# Patient Record
Sex: Female | Born: 1990 | Race: Black or African American | Hispanic: No | Marital: Single | Smoking: Former smoker
Health system: Southern US, Community
[De-identification: ages and names within clinical notes are randomized; demographics above are authoritative.]

## PROBLEM LIST (undated history)

## (undated) DIAGNOSIS — F1490 Cocaine use, unspecified, uncomplicated: Secondary | ICD-10-CM

## (undated) DIAGNOSIS — B009 Herpesviral infection, unspecified: Secondary | ICD-10-CM

## (undated) DIAGNOSIS — O139 Gestational [pregnancy-induced] hypertension without significant proteinuria, unspecified trimester: Secondary | ICD-10-CM

## (undated) HISTORY — DX: Gestational (pregnancy-induced) hypertension without significant proteinuria, unspecified trimester: O13.9

---

## 2016-02-25 DIAGNOSIS — F1020 Alcohol dependence, uncomplicated: Secondary | ICD-10-CM | POA: Insufficient documentation

## 2016-02-25 DIAGNOSIS — F332 Major depressive disorder, recurrent severe without psychotic features: Secondary | ICD-10-CM | POA: Insufficient documentation

## 2016-05-02 DIAGNOSIS — F17200 Nicotine dependence, unspecified, uncomplicated: Secondary | ICD-10-CM | POA: Insufficient documentation

## 2016-06-20 DIAGNOSIS — Z8659 Personal history of other mental and behavioral disorders: Secondary | ICD-10-CM | POA: Insufficient documentation

## 2016-07-31 DIAGNOSIS — F1910 Other psychoactive substance abuse, uncomplicated: Secondary | ICD-10-CM | POA: Insufficient documentation

## 2016-07-31 DIAGNOSIS — F3130 Bipolar disorder, current episode depressed, mild or moderate severity, unspecified: Secondary | ICD-10-CM | POA: Diagnosis present

## 2016-08-01 DIAGNOSIS — F314 Bipolar disorder, current episode depressed, severe, without psychotic features: Secondary | ICD-10-CM | POA: Insufficient documentation

## 2017-03-28 ENCOUNTER — Emergency Department (HOSPITAL_COMMUNITY)
Admission: EM | Admit: 2017-03-28 | Discharge: 2017-03-29 | Disposition: A | Discharge status: Home / Self Care | Payer: Self-pay | Attending: Emergency Medicine | Admitting: Emergency Medicine

## 2017-03-28 DIAGNOSIS — N390 Urinary tract infection, site not specified: Secondary | ICD-10-CM

## 2017-03-28 DIAGNOSIS — F1410 Cocaine abuse, uncomplicated: Secondary | ICD-10-CM

## 2017-03-28 DIAGNOSIS — R4182 Altered mental status, unspecified: Secondary | ICD-10-CM

## 2017-03-29 ENCOUNTER — Encounter (HOSPITAL_COMMUNITY): Payer: Self-pay

## 2017-03-29 ENCOUNTER — Emergency Department (HOSPITAL_COMMUNITY): Payer: Self-pay

## 2017-03-29 LAB — URINALYSIS, ROUTINE W REFLEX MICROSCOPIC
BILIRUBIN URINE: NEGATIVE
Glucose, UA: NEGATIVE mg/dL
HGB URINE DIPSTICK: NEGATIVE
KETONES UR: NEGATIVE mg/dL
NITRITE: POSITIVE — AB
PROTEIN: 30 mg/dL — AB
Specific Gravity, Urine: 1.025 (ref 1.005–1.030)
pH: 5 (ref 5.0–8.0)

## 2017-03-29 LAB — CBC WITH DIFFERENTIAL/PLATELET
Basophils Absolute: 0.1 10*3/uL (ref 0.0–0.1)
Basophils Relative: 1 %
EOS PCT: 3 %
Eosinophils Absolute: 0.3 10*3/uL (ref 0.0–0.7)
HCT: 38.8 % (ref 36.0–46.0)
Hemoglobin: 12.8 g/dL (ref 12.0–15.0)
LYMPHS ABS: 3.5 10*3/uL (ref 0.7–4.0)
LYMPHS PCT: 38 %
MCH: 27.5 pg (ref 26.0–34.0)
MCHC: 33 g/dL (ref 30.0–36.0)
MCV: 83.4 fL (ref 78.0–100.0)
MONO ABS: 0.7 10*3/uL (ref 0.1–1.0)
MONOS PCT: 8 %
Neutro Abs: 4.8 10*3/uL (ref 1.7–7.7)
Neutrophils Relative %: 50 %
PLATELETS: 375 10*3/uL (ref 150–400)
RBC: 4.65 MIL/uL (ref 3.87–5.11)
RDW: 14.5 % (ref 11.5–15.5)
WBC: 9.4 10*3/uL (ref 4.0–10.5)

## 2017-03-29 LAB — COMPREHENSIVE METABOLIC PANEL
ALT: 16 U/L (ref 14–54)
AST: 25 U/L (ref 15–41)
Albumin: 3.9 g/dL (ref 3.5–5.0)
Alkaline Phosphatase: 75 U/L (ref 38–126)
Anion gap: 6 (ref 5–15)
BUN: 7 mg/dL (ref 6–20)
CHLORIDE: 106 mmol/L (ref 101–111)
CO2: 29 mmol/L (ref 22–32)
CREATININE: 1.01 mg/dL — AB (ref 0.44–1.00)
Calcium: 9.2 mg/dL (ref 8.9–10.3)
Glucose, Bld: 106 mg/dL — ABNORMAL HIGH (ref 65–99)
Potassium: 3.4 mmol/L — ABNORMAL LOW (ref 3.5–5.1)
Sodium: 141 mmol/L (ref 135–145)
Total Bilirubin: 0.2 mg/dL — ABNORMAL LOW (ref 0.3–1.2)
Total Protein: 7.6 g/dL (ref 6.5–8.1)

## 2017-03-29 LAB — RAPID URINE DRUG SCREEN, HOSP PERFORMED
Amphetamines: NOT DETECTED
Barbiturates: NOT DETECTED
Benzodiazepines: NOT DETECTED
COCAINE: POSITIVE — AB
OPIATES: NOT DETECTED
TETRAHYDROCANNABINOL: NOT DETECTED

## 2017-03-29 LAB — PREGNANCY, URINE: Preg Test, Ur: NEGATIVE

## 2017-03-29 LAB — I-STAT CG4 LACTIC ACID, ED: Lactic Acid, Venous: 1.06 mmol/L (ref 0.5–1.9)

## 2017-03-29 LAB — CBG MONITORING, ED: Glucose-Capillary: 129 mg/dL — ABNORMAL HIGH (ref 65–99)

## 2017-03-29 LAB — ETHANOL

## 2017-03-29 MED ORDER — SODIUM CHLORIDE 0.9 % IV SOLN
INTRAVENOUS | Status: DC
  Administered 2017-03-29: 05:00:00 via INTRAVENOUS

## 2017-03-29 MED ORDER — DEXTROSE 5 % IV SOLN
1.0000 g | Freq: Once | INTRAVENOUS | Status: AC
  Administered 2017-03-29: 1 g via INTRAVENOUS
  Filled 2017-03-29: qty 10

## 2017-03-29 MED ORDER — NALOXONE HCL 4 MG/0.1ML NA LIQD
0.4000 mg | Freq: Once | NASAL | Status: AC
  Administered 2017-03-29: 0.4 mg via NASAL

## 2017-03-29 MED ORDER — POTASSIUM CHLORIDE CRYS ER 20 MEQ PO TBCR
40.0000 meq | EXTENDED_RELEASE_TABLET | Freq: Once | ORAL | Status: AC
  Administered 2017-03-29: 40 meq via ORAL
  Filled 2017-03-29: qty 2

## 2017-03-29 MED ORDER — CEPHALEXIN 500 MG PO CAPS: 500.0000 mg | ORAL_CAPSULE | Freq: Four times a day (QID) | ORAL | 0 refills | Status: DC

## 2017-03-29 MED ORDER — SODIUM CHLORIDE 0.9 % IV BOLUS (SEPSIS)
1000.0000 mL | Freq: Once | INTRAVENOUS | Status: AC
  Administered 2017-03-29: 1000 mL via INTRAVENOUS

## 2017-03-29 NOTE — Discharge Instructions (Signed)
1. Medications: keflex, usual home medications °2. Treatment: rest, drink plenty of fluids, take medications as prescribed °3. Follow Up: Please followup with your primary doctor in 3 days for discussion of your diagnoses and further evaluation after today's visit; if you do not have a primary care doctor use the resource guide provided to find one; return to the ER for fevers, persistent vomiting, worsening abdominal pain or other concerning symptoms. ° °

## 2017-03-29 NOTE — ED Notes (Signed)
Awaiting [employer]

## 2017-03-29 NOTE — ED Triage Notes (Signed)
PT brought in by EMS who was called by [employer] for a pt check up. Per [employer] pt got pulled out of a car according to bystanders, and pt went into gas station and began yelling, and pulling off her shirt and acting erratic. Pt would not cooperate with [employer] and was placed in cuff ( spitting, cursing). Pt did stated to [employer] that she ingested Crack.  Pt was able to stand but refused to cooperate and give [employer] name or any other information. Pt is currently in police custody.     HR 80 BP 92/58, RR 30, CBG 154, O2 94% RA

## 2017-03-29 NOTE — ED Provider Notes (Signed)
[facility]-EMERGENCY DEPT Provider Note   CSN: [id number] Arrival date & time: 03/28/17  2354     History   Chief Complaint Chief Complaint  Patient presents with  . Drug Overdose    HPI Jordan Walton is a 26 y.o. female presents via EMS with [employer] after [employer] was called to a gas station. Her [employer], patient was out of the vehicle at the gas station. She went inside and began to disrobe, cursing and screaming. EMS reports that patient was very lethargic upon their arrival with eyes rolling back in her head but no seizure activity. There report that when stimulated she became angry cursing and screaming but was unwilling or unable to provide any acute finding information or health history.  Level V caveat for altered mental status.  The history is provided by the EMS personnel and the police. No language interpreter was used.    No past medical history on file.  There are no active problems to display for this patient.   No past surgical history on file.  OB History    No data available       Home Medications    Prior to Admission medications   Medication Sig Start Date End Date Taking? Authorizing Provider  cephALEXin (KEFLEX) 500 MG capsule Take 1 capsule (500 mg total) by mouth 4 (four) times daily. 03/29/17   Kaylenn Civil, Dahlia Client, PA-C    Family History No family history on file.  Social History Social History  Substance Use Topics  . Smoking status: Not on file  . Smokeless tobacco: Not on file  . Alcohol use Not on file     Allergies   Patient has no allergy information on record.   Review of Systems Review of Systems  Unable to perform ROS: Mental status change     Physical Exam Updated Vital Signs BP (!) 99/58 (BP Location: Left Arm)   Pulse 66   Temp 97.7 F (36.5 C) (Oral)   Resp 18   LMP  (LMP Unknown)   SpO2 96%   Physical Exam  Constitutional: She appears well-developed and well-nourished. She appears lethargic.  When gently  stimulated, patient opens her eyes and makes eye contact, but almost immediately falls asleep. She answers no questions.  When more forcefully stimulated she began spitting, but continues to be unwilling or unable to answer questions  HENT:  Head: Normocephalic.  Eyes: Conjunctivae are normal. Pupils are equal, round, and reactive to light. No scleral icterus.  Pupils midpoint  Neck: Normal range of motion.  Cardiovascular: Normal rate, regular rhythm and intact distal pulses.   Pulses:      Radial pulses are 2+ on the right side, and 2+ on the left side.       Dorsalis pedis pulses are 2+ on the right side, and 2+ on the left side.  Pulmonary/Chest: Effort normal.  Snoring respirations No contusions, lacerations or erythema No crepitus or flail segment  Abdominal: Soft. Bowel sounds are normal.  No contusions, lacerations or erythema  Genitourinary:  Genitourinary Comments: Chaperone present Normal external GU exam  Musculoskeletal: Normal range of motion.  When stimulated, pt moves all extremities No contusions, ecchymosis or step offs of the C, T or L-spine  Neurological: She appears lethargic. GCS eye subscore is 2. GCS verbal subscore is 1. GCS motor subscore is 5.  Skin: Skin is warm and dry.  Nursing note and vitals reviewed.    ED Treatments / Results  Labs (all labs ordered are  listed, but only abnormal results are displayed) Labs Reviewed  COMPREHENSIVE METABOLIC PANEL - Abnormal; Notable for the following:       Result Value   Potassium 3.4 (*)    Glucose, Bld 106 (*)    Creatinine, Ser 1.01 (*)    Total Bilirubin 0.2 (*)    All other components within normal limits  RAPID URINE DRUG SCREEN, HOSP PERFORMED - Abnormal; Notable for the following:    Cocaine POSITIVE (*)    All other components within normal limits  URINALYSIS, ROUTINE W REFLEX MICROSCOPIC - Abnormal; Notable for the following:    Color, Urine AMBER (*)    APPearance HAZY (*)    Protein, ur 30  (*)    Nitrite POSITIVE (*)    Leukocytes, UA MODERATE (*)    Bacteria, UA RARE (*)    Squamous Epithelial / LPF 6-30 (*)    All other components within normal limits  CBG MONITORING, ED - Abnormal; Notable for the following:    Glucose-Capillary 129 (*)    All other components within normal limits  URINE CULTURE  CBC WITH DIFFERENTIAL/PLATELET  ETHANOL  PREGNANCY, URINE  I-STAT CG4 LACTIC ACID, ED  I-STAT CG4 LACTIC ACID, ED    EKG  EKG Interpretation  Date/Time:  Wednesday March 29 2017 01:08:11 EDT Ventricular Rate:  71 PR Interval:    QRS Duration: 98 QT Interval:  421 QTC Calculation: 458 R Axis:   63 Text Interpretation:  Sinus rhythm Confirmed by Ross Marcus ([id number]) on 03/29/2017 1:16:38 AM       Radiology Ct Head Wo Contrast  Result Date: 03/29/2017 CLINICAL DATA:  26 y/o  F; ALOC. EXAM: CT HEAD WITHOUT CONTRAST CT CERVICAL SPINE WITHOUT CONTRAST TECHNIQUE: Multidetector CT imaging of the head and cervical spine was performed following the standard protocol without intravenous contrast. Multiplanar CT image reconstructions of the cervical spine were also generated. COMPARISON:  None. FINDINGS: CT HEAD FINDINGS Brain: No evidence of acute infarction, hemorrhage, hydrocephalus, extra-axial collection or mass lesion/mass effect. Vascular: No hyperdense vessel or unexpected calcification. Skull: Normal. Negative for fracture or focal lesion. Sinuses/Orbits: No acute finding. Other: None. CT CERVICAL SPINE FINDINGS Alignment: Normal. Skull base and vertebrae: No acute fracture. No primary bone lesion or focal pathologic process. Soft tissues and spinal canal: No prevertebral fluid or swelling. No visible canal hematoma. Disc levels:  No significant cervical spine degenerative changes. Upper chest: Negative. Other: Negative. IMPRESSION: Normal CT of the head and cervical spine. Electronically Signed   By: Mitzi Hansen M.D.   On: 03/29/2017 01:59   Ct Cervical  Spine Wo Contrast  Result Date: 03/29/2017 CLINICAL DATA:  26 y/o  F; ALOC. EXAM: CT HEAD WITHOUT CONTRAST CT CERVICAL SPINE WITHOUT CONTRAST TECHNIQUE: Multidetector CT imaging of the head and cervical spine was performed following the standard protocol without intravenous contrast. Multiplanar CT image reconstructions of the cervical spine were also generated. COMPARISON:  None. FINDINGS: CT HEAD FINDINGS Brain: No evidence of acute infarction, hemorrhage, hydrocephalus, extra-axial collection or mass lesion/mass effect. Vascular: No hyperdense vessel or unexpected calcification. Skull: Normal. Negative for fracture or focal lesion. Sinuses/Orbits: No acute finding. Other: None. CT CERVICAL SPINE FINDINGS Alignment: Normal. Skull base and vertebrae: No acute fracture. No primary bone lesion or focal pathologic process. Soft tissues and spinal canal: No prevertebral fluid or swelling. No visible canal hematoma. Disc levels:  No significant cervical spine degenerative changes. Upper chest: Negative. Other: Negative. IMPRESSION: Normal CT of the head and cervical  spine. Electronically Signed   By: Mitzi HansenLance  Furusawa-Stratton M.D.   On: 03/29/2017 01:59   Dg Chest Port 1 View  Result Date: 03/29/2017 CLINICAL DATA:  Altered mental status. EXAM: PORTABLE CHEST 1 VIEW COMPARISON:  None. FINDINGS: The heart size and mediastinal contours are within normal limits. Both lungs are clear. The visualized skeletal structures are unremarkable. IMPRESSION: No active disease. Electronically Signed   By: Ellery Plunkaniel R Mitchell M.D.   On: 03/29/2017 01:06    Procedures Procedures (including critical care time)  Medications Ordered in ED Medications  sodium chloride 0.9 % bolus 1,000 mL (0 mLs Intravenous Stopped 03/29/17 0427)    And  0.9 %  sodium chloride infusion ( Intravenous New Bag/Given 03/29/17 0522)  cefTRIAXone (ROCEPHIN) 1 g in dextrose 5 % 50 mL IVPB (1 g Intravenous New Bag/Given 03/29/17 0519)  naloxone (NARCAN)  nasal spray 4 mg/0.1 mL (0.4 mg Nasal Provided for home use 03/29/17 0042)     Initial Impression / Assessment and Plan / ED Course  I have reviewed the triage vital signs and the nursing notes.  Pertinent labs & imaging results that were available during my care of the patient were reviewed by me and considered in my medical decision making (see chart for details).     Patient presents with [employer] after aggressive behavior. She is altered. Lab workup is reassuring. Very mild hypokalemia, repleted in the department. Ethanol level is negative. Drug screen is positive for cocaine. Urinalysis shows positive nitrites and leukocytes with 6-30 white blood cells. We will treat for UTI.  Patient is not anemic. No other electrolyte derangements. CT head/neck without acute abnormality.  She's been given fluids and Rocephin here in the emergency department. Patient is able to stand and ambulate around the room with steady gait. She is eating. When asked how she feels patient replies "great."  But closes her eyes or looks away when asked what happened tonight. She refuses to reveal any information about the events of tonight or what she ingested. She is alert and able to perform her ADLs. She will be discharged back into the custody of [employer].  Final Clinical Impressions(s) / ED Diagnoses   Final diagnoses:  Cocaine abuse  Urinary tract infection without hematuria, site unspecified  Altered mental status, unspecified altered mental status type    New Prescriptions New Prescriptions   CEPHALEXIN (KEFLEX) 500 MG CAPSULE    Take 1 capsule (500 mg total) by mouth 4 (four) times daily.     Dierdre ForthMuthersbaugh, Meklit Cotta, Cordelia Poche-C 03/29/17 [id number]    Shon BatonHorton, Courtney F, MD 03/29/17 [phone]

## 2017-03-31 LAB — URINE CULTURE

## 2017-04-01 ENCOUNTER — Telehealth: Payer: Self-pay

## 2017-04-01 NOTE — Telephone Encounter (Signed)
Post ED Visit - Positive Culture Follow-up  Culture report reviewed by antimicrobial stewardship pharmacist:  []  Enzo BiNathan Batchelder, Pharm.D. []  Celedonio MiyamotoJeremy Frens, Pharm.D., BCPS AQ-ID []  Garvin FilaMike Maccia, Pharm.D., BCPS []  Georgina PillionElizabeth Martin, Pharm.D., BCPS []  Hato CandalMinh Pham, [address] BoulevardPharm.D., BCPS, AAHIVP []  Estella HuskMichelle Turner, Pharm.D., BCPS, AAHIVP [x]  Lysle Pearlachel Rumbarger, PharmD, BCPS []  Casilda Carlsaylor Stone, PharmD, BCPS []  Pollyann SamplesAndy Johnston, PharmD, BCPS  Positive urine culture Treated with Cephaelixin, organism sensitive to the same and no further patient follow-up is required at this time.  Jerry CarasCullom, Yannis Broce Burnett 04/01/2017, 10:28 AM

## 2017-04-13 ENCOUNTER — Encounter (HOSPITAL_COMMUNITY): Payer: Self-pay

## 2017-04-13 ENCOUNTER — Emergency Department (HOSPITAL_COMMUNITY)
Admission: EM | Admit: 2017-04-13 | Discharge: 2017-04-13 | Disposition: A | Discharge status: Home / Self Care | Payer: Self-pay | Attending: Emergency Medicine | Admitting: Emergency Medicine

## 2017-04-13 DIAGNOSIS — F1414 Cocaine abuse with cocaine-induced mood disorder: Secondary | ICD-10-CM | POA: Diagnosis present

## 2017-04-13 DIAGNOSIS — F17200 Nicotine dependence, unspecified, uncomplicated: Secondary | ICD-10-CM | POA: Insufficient documentation

## 2017-04-13 DIAGNOSIS — R45851 Suicidal ideations: Secondary | ICD-10-CM | POA: Insufficient documentation

## 2017-04-13 DIAGNOSIS — F1410 Cocaine abuse, uncomplicated: Secondary | ICD-10-CM | POA: Diagnosis present

## 2017-04-13 DIAGNOSIS — F1910 Other psychoactive substance abuse, uncomplicated: Secondary | ICD-10-CM

## 2017-04-13 LAB — CBC WITH DIFFERENTIAL/PLATELET
BASOS ABS: 0.1 10*3/uL (ref 0.0–0.1)
Basophils Relative: 0 %
EOS PCT: 2 %
Eosinophils Absolute: 0.3 10*3/uL (ref 0.0–0.7)
HEMATOCRIT: 39.5 % (ref 36.0–46.0)
Hemoglobin: 12.9 g/dL (ref 12.0–15.0)
LYMPHS ABS: 3.9 10*3/uL (ref 0.7–4.0)
LYMPHS PCT: 33 %
MCH: 27.4 pg (ref 26.0–34.0)
MCHC: 32.7 g/dL (ref 30.0–36.0)
MCV: 84 fL (ref 78.0–100.0)
MONO ABS: 0.5 10*3/uL (ref 0.1–1.0)
MONOS PCT: 4 %
NEUTROS ABS: 7.2 10*3/uL (ref 1.7–7.7)
Neutrophils Relative %: 61 %
PLATELETS: 392 10*3/uL (ref 150–400)
RBC: 4.7 MIL/uL (ref 3.87–5.11)
RDW: 14.8 % (ref 11.5–15.5)
WBC: 11.9 10*3/uL — ABNORMAL HIGH (ref 4.0–10.5)

## 2017-04-13 LAB — COMPREHENSIVE METABOLIC PANEL
ALBUMIN: 4.2 g/dL (ref 3.5–5.0)
ALT: 14 U/L (ref 14–54)
ANION GAP: 8 (ref 5–15)
AST: 20 U/L (ref 15–41)
Alkaline Phosphatase: 70 U/L (ref 38–126)
BILIRUBIN TOTAL: 0.3 mg/dL (ref 0.3–1.2)
BUN: 14 mg/dL (ref 6–20)
CHLORIDE: 104 mmol/L (ref 101–111)
CO2: 24 mmol/L (ref 22–32)
Calcium: 9.2 mg/dL (ref 8.9–10.3)
Creatinine, Ser: 0.99 mg/dL (ref 0.44–1.00)
GFR calc Af Amer: >60 mL/min (ref >60)
GFR calc non Af Amer: >60 mL/min (ref >60)
GLUCOSE: 110 mg/dL — AB (ref 65–99)
POTASSIUM: 3.8 mmol/L (ref 3.5–5.1)
SODIUM: 136 mmol/L (ref 135–145)
Total Protein: 8.2 g/dL — ABNORMAL HIGH (ref 6.5–8.1)

## 2017-04-13 LAB — ACETAMINOPHEN LEVEL: Acetaminophen (Tylenol), Serum: <10 ug/mL — ABNORMAL LOW (ref 10–30)

## 2017-04-13 LAB — ETHANOL

## 2017-04-13 LAB — SALICYLATE LEVEL: Salicylate Lvl: <7 mg/dL (ref 2.8–30.0)

## 2017-04-13 MED ORDER — ALUM & MAG HYDROXIDE-SIMETH 200-200-20 MG/5ML PO SUSP: 30.0000 mL | Freq: Four times a day (QID) | ORAL | Status: DC | PRN

## 2017-04-13 MED ORDER — LORAZEPAM 1 MG PO TABS: 0.0000 mg | ORAL_TABLET | Freq: Four times a day (QID) | ORAL | Status: DC

## 2017-04-13 MED ORDER — LORAZEPAM 2 MG/ML IJ SOLN: 0.0000 mg | Freq: Two times a day (BID) | INTRAMUSCULAR | Status: DC

## 2017-04-13 MED ORDER — LORAZEPAM 2 MG/ML IJ SOLN
0.0000 mg | Freq: Four times a day (QID) | INTRAMUSCULAR | Status: DC
Start: 2017-04-13 — End: 2017-04-13

## 2017-04-13 MED ORDER — ZOLPIDEM TARTRATE 5 MG PO TABS: 5.0000 mg | ORAL_TABLET | Freq: Every evening | ORAL | Status: DC | PRN

## 2017-04-13 MED ORDER — ACETAMINOPHEN 325 MG PO TABS: 650.0000 mg | ORAL_TABLET | ORAL | Status: DC | PRN

## 2017-04-13 MED ORDER — LORAZEPAM 1 MG PO TABS: 0.0000 mg | ORAL_TABLET | Freq: Two times a day (BID) | ORAL | Status: DC

## 2017-04-13 MED ORDER — ONDANSETRON HCL 4 MG PO TABS: 4.0000 mg | ORAL_TABLET | Freq: Three times a day (TID) | ORAL | Status: DC | PRN

## 2017-04-13 MED ORDER — NICOTINE 21 MG/24HR TD PT24: 21.0000 mg | MEDICATED_PATCH | Freq: Every day | TRANSDERMAL | Status: DC

## 2017-04-13 MED ORDER — THIAMINE HCL 100 MG/ML IJ SOLN: 100.0000 mg | Freq: Every day | INTRAMUSCULAR | Status: DC

## 2017-04-13 MED ORDER — VITAMIN B-1 100 MG PO TABS: 100.0000 mg | ORAL_TABLET | Freq: Every day | ORAL | Status: DC

## 2017-04-13 NOTE — BH Assessment (Signed)
Tele Assessment Note   Jordan Walton is an 26 y.o. female.  Pt was poor historian. Pt reports recent crack cocaine use. Pt appeared to drowsy throughout the assessment. Pt requests detox from alcohol and crack cocaine. Pt reports SI with no plan. Pt denies HI and AVH. Pt reports daily alcohol and crack cocaine use. Pt could not report how much alcohol and cocaine she is using.. The Pt reports multiple hospitalizations for SI and SA. Pt denies abuse.  Per Dr. Lucianne MussKumar and Catha NottinghamJamison, DNP Pt does not meet inpatient criteria. Recommends D/C to SA program.   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 No pertinent past medical history.  History reviewed. No pertinent surgical history.  Family History: No family history on file.  Social History:  reports that she has been smoking.  She has never used smokeless tobacco. She reports that she uses drugs. Her alcohol history is not on file.  Additional Social History:  Alcohol / Drug Use Pain Medications: please see mar Prescriptions: please see mar Over the Counter: please see mar History of alcohol / drug use?: Yes Longest period of sobriety (when/how long): unknown Substance #1 Name of Substance 1: alcohol 1 - Age of First Use: unknown 1 - Amount (size/oz): cannot recall 1 - Frequency: daily 1 - Duration: ongoing 1 - Last Use / Amount: 04/12/17 Substance #2 Name of Substance 2: cocaine 2 - Age of First Use: unknown 2 - Amount (size/oz): gram 2 - Frequency: daily 2 - Duration: ongoing 2 - Last Use / Amount: 04/12/17  CIWA: CIWA-Ar BP: 99/79 Pulse Rate: 79 Nausea and Vomiting: no nausea and no vomiting Tactile Disturbances: none Tremor: no tremor Auditory Disturbances: not present Paroxysmal Sweats: no sweat visible Visual Disturbances: not present Anxiety: no anxiety, at ease Headache, Fullness in Head: none present Agitation: normal activity Orientation and  Clouding of Sensorium: oriented and can do serial additions CIWA-Ar Total: 0 COWS:    PATIENT STRENGTHS: (choose at least two) Average or above average intelligence Communication skills  Allergies: No Known Allergies  Home Medications:  (Not in a hospital admission)  OB/GYN Status:  No LMP recorded (lmp unknown).  General Assessment Data Location of Assessment: [facility] ED TTS Assessment: In system Is this a Tele or Face-to-Face Assessment?: Face-to-Face Is this an Initial Assessment or a Re-assessment for this encounter?: Initial Assessment Marital status: Single Maiden name: NA Is patient pregnant?: No Pregnancy Status: No Living Arrangements: Alone Can pt return to current living arrangement?: Yes Admission Status: Voluntary Is patient capable of signing voluntary admission?: Yes Referral Source: Self/Family/Friend Insurance type: SP     Crisis Care Plan Living Arrangements: Alone Legal Guardian: Other: (self) Name of Psychiatrist: NA Name of Therapist: NA  Education Status Is patient currently in school?: No Current Grade: NA  Risk to self with the past 6 months Suicidal Ideation: Yes-Currently Present Has patient been a risk to self within the past 6 months prior to admission? : Yes Suicidal Intent: No Has patient had any suicidal intent within the past 6 months prior to admission? : No Is patient at risk for suicide?: No Suicidal Plan?: No Has patient had any suicidal plan within the past 6 months prior to admission? : No Access to Means: No What has been your use of drugs/alcohol within the last 12 months?: crack cocaine and alcohol Previous Attempts/Gestures: No How many times?: 0 Other Self Harm Risks: NA Triggers for Past Attempts: None  known Intentional Self Injurious Behavior: None Family Suicide History: No Recent stressful life event(s): Other (Comment) (SA) Persecutory voices/beliefs?: No Depression: Yes Depression Symptoms: Tearfulness,  Isolating, Loss of interest in usual pleasures, Feeling worthless/self pity, Feeling angry/irritable Substance abuse history and/or treatment for substance abuse?: No Suicide prevention information given to non-admitted patients: Not applicable  Risk to Others within the past 6 months Homicidal Ideation: No Does patient have any lifetime risk of violence toward others beyond the six months prior to admission? : No Thoughts of Harm to Others: No Current Homicidal Intent: No Current Homicidal Plan: No Access to Homicidal Means: No Identified Victim: NA History of harm to others?: No Assessment of Violence: None Noted Violent Behavior Description: NA Does patient have access to weapons?: No Criminal Charges Pending?: No Does patient have a court date: No Is patient on probation?: No  Psychosis Hallucinations: None noted Delusions: None noted  Mental Status Report Appearance/Hygiene: Unremarkable Eye Contact: Fair Motor Activity: Freedom of movement Speech: Logical/coherent Level of Consciousness: Alert Mood: Depressed Affect: Depressed Anxiety Level: None Thought Processes: Coherent, Relevant Judgement: Unimpaired Orientation: Person, Place, Time, Situation Obsessive Compulsive Thoughts/Behaviors: None  Cognitive Functioning Concentration: Normal Memory: Recent Intact, Remote Intact IQ: Average Insight: Fair Impulse Control: Fair Appetite: Fair Weight Loss: 0 Weight Gain: 0 Sleep: Decreased Total Hours of Sleep: 4 Vegetative Symptoms: None  ADLScreening [facility] Assessment Services) Patient's cognitive ability adequate to safely complete daily activities?: Yes Patient able to express need for assistance with ADLs?: Yes Independently performs ADLs?: Yes (appropriate for developmental age)  Prior Inpatient Therapy Prior Inpatient Therapy: Yes Prior Therapy Dates: multiple Prior Therapy Facilty/Provider(s): [facility] Reason for Treatment: SA and SI  Prior Outpatient  Therapy Prior Outpatient Therapy: Yes Prior Therapy Dates: unknown Prior Therapy Facilty/Provider(s): unknown Reason for Treatment: SA and SI Does patient have an ACCT team?: No Does patient have Intensive In-House Services?  : No Does patient have [facility] services? : No Does patient have P4CC services?: No  ADL Screening (condition at time of admission) Patient's cognitive ability adequate to safely complete daily activities?: Yes Is the patient deaf or have difficulty hearing?: No Does the patient have difficulty seeing, even when wearing glasses/contacts?: No Does the patient have difficulty concentrating, remembering, or making decisions?: No Patient able to express need for assistance with ADLs?: Yes Does the patient have difficulty dressing or bathing?: No Independently performs ADLs?: Yes (appropriate for developmental age) Does the patient have difficulty walking or climbing stairs?: No Weakness of Legs: None Weakness of Arms/Hands: None       Abuse/Neglect Assessment (Assessment to be complete while patient is alone) Physical Abuse: Denies Verbal Abuse: Denies Sexual Abuse: Denies Exploitation of patient/patient's resources: Denies Self-Neglect: Denies     Merchant navy officer (For Healthcare) Does Patient Have a Medical Advance Directive?: No    Additional Information 1:1 In Past 12 Months?: No CIRT Risk: No Elopement Risk: No Does patient have medical clearance?: Yes     Disposition:  Disposition Initial Assessment Completed for this Encounter: Yes Disposition of Patient: Outpatient treatment Type of outpatient treatment: Adult  Christin Mccreedy D 04/13/2017 10:08 AM

## 2017-04-13 NOTE — [facility] Suicide Risk Assessment (Signed)
Suicide Risk Assessment  Discharge Assessment   [facility]   Principal Problem: Cocaine abuse with cocaine-induced mood disorder [facility](HCC) Discharge Diagnoses:  Patient Active Problem List   Diagnosis Date Noted  . Cocaine abuse [F14.10] 04/13/2017    Priority: High  . Cocaine abuse with cocaine-induced mood disorder [facility](HCC) [F14.14] 04/13/2017    Priority: High    Total Time spent with patient: 45 minutes  Musculoskeletal: Strength & Muscle Tone: within normal limits Gait & Station: normal Patient leans: N/A  Psychiatric Specialty Exam:   Blood pressure 99/79, pulse 79, temperature 98 F (36.7 C), temperature source Oral, resp. rate 16, SpO2 99 %.There is no height or weight on file to calculate BMI.  General Appearance: Casual  Eye Contact::  Good  Speech:  Normal Rate409  Volume:  Normal  Mood:  Anxious, mild  Affect:  Congruent  Thought Process:  Coherent and Descriptions of Associations: Intact  Orientation:  Full (Time, Place, and Person)  Thought Content:  WDL and Logical  Suicidal Thoughts:  No  Homicidal Thoughts:  No  Memory:  Immediate;   Good Recent;   Good Remote;   Good  Judgement:  Fair  Insight:  Fair  Psychomotor Activity:  Normal  Concentration:  Good  Recall:  Good  Fund of Knowledge:Good  Language: Good  Akathisia:  No  Handed:  Right  AIMS (if indicated):     Assets:  Housing Leisure Time Physical Health Resilience Social Support  Sleep:     Cognition: WNL  ADL's:  Intact   Mental Status Per Nursing Assessment::   On Admission:   cocaine abuse with vague passive suicidal ideations on admission.  When assessed by the psychiatrist and NP, she denied suicidal ideations.   Lives with her mother.  No homicidal ideations, hallucinations, or withdrawal symptoms.   Occasional use of alcohol.  Outpatient resources for substance abuse provided, agreeable to attend.  Demographic Factors:  Adolescent or young adult  Loss  Factors: NA  Historical Factors: NA  Risk Reduction Factors:   Sense of responsibility to family, Living with another person, especially a relative and Positive social support  Continued Clinical Symptoms:  Anxiety, low  Cognitive Features That Contribute To Risk:  None    Suicide Risk:  Minimal: No identifiable suicidal ideation.  Patients presenting with no risk factors but with morbid ruminations; may be classified as minimal risk based on the severity of the depressive symptoms    Plan Of Care/Follow-up recommendations:  Activity:  as tolerated Diet:  heart healthy diet  LORD, JAMISON, NP 04/13/2017, 11:08 AM

## 2017-04-13 NOTE — Discharge Instructions (Signed)
For your behavioral health needs, you are advised to contact [facility].  Contact them at your earliest opportunity, and they will make arrangements for your treatment needs.  Please note that the phone number listed below also serves as a 24 hour crisis number:       [facility]      [phone]

## 2017-04-13 NOTE — ED Provider Notes (Signed)
[facility]-EMERGENCY DEPT Provider Note   CSN: [id number] Arrival date & time: 04/13/17  0636     History   Chief Complaint Chief Complaint  Patient presents with  . Suicidal    HPI Jordan Wannetta SenderRenee Beazley is a 26 y.o. female.  HPI   26 year old female brought here via ambulance for evaluation of suicidal ideation. History was difficult to obtain as patient is very drowsy but easily arousable. Patient states she is here with suicidal ideation with plan to overdose on crack and cocaine. States that her last use was an hour ago. She also admits to drinking about a pint of liquor last night as well. She reported feeling depressed and wanted to end her life. She reported overdose on crack cocaine in the past. She denies any homicidal ideation. She does admits to occasional auditory and visual hallucination. She denies having any active pain. Patient cannot tell me if she is on any long-term psychiatric medication. She is a smoker. No complaints of any active pain. She admits that she hadn't been sleeping much due to her cocaine binge.  History reviewed. No pertinent past medical history.  There are no active problems to display for this patient.   History reviewed. No pertinent surgical history.  OB History    No data available       Home Medications    Prior to Admission medications   Medication Sig Start Date End Date Taking? Authorizing Provider  cephALEXin (KEFLEX) 500 MG capsule Take 1 capsule (500 mg total) by mouth 4 (four) times daily. Patient not taking: Reported on 04/13/2017 03/29/17   Muthersbaugh, Dahlia ClientHannah, PA-C    Family History No family history on file.  Social History Social History  Substance Use Topics  . Smoking status: Current Every Day Smoker  . Smokeless tobacco: Never Used  . Alcohol use Not on file     Allergies   Patient has no known allergies.   Review of Systems Review of Systems  Unable to perform ROS: Psychiatric disorder     Physical  Exam Updated Vital Signs BP 113/76 (BP Location: Left Arm)   Pulse 88   Temp 98 F (36.7 C) (Oral)   Resp 18   LMP  (LMP Unknown)   SpO2 94%   Physical Exam  Constitutional: She appears well-developed and well-nourished. No distress.  Obese female, sleepy but easily arousable and in no acute discomfort.  HENT:  Head: Atraumatic.  Mouth/Throat: Oropharynx is clear and moist.  Eyes: Pupils are equal, round, and reactive to light. Conjunctivae and EOM are normal.  Neck: Neck supple.  No nuchal rigidity  Cardiovascular: Normal rate and regular rhythm.   Pulmonary/Chest: Effort normal and breath sounds normal.  Abdominal: Soft. She exhibits no distension. There is no tenderness.  Neurological:  Sleepy but easily arousable and answers question appropriately when aroused  Skin: No rash noted.  Psychiatric: Her affect is blunt. Her speech is slurred. She is slowed. She expresses suicidal ideation. She expresses no homicidal ideation. She expresses suicidal plans. She expresses no homicidal plans.  Nursing note and vitals reviewed.    ED Treatments / Results  Labs (all labs ordered are listed, but only abnormal results are displayed) Labs Reviewed  COMPREHENSIVE METABOLIC PANEL - Abnormal; Notable for the following:       Result Value   Glucose, Bld 110 (*)    Total Protein 8.2 (*)    All other components within normal limits  CBC WITH DIFFERENTIAL/PLATELET - Abnormal; Notable for the  following:    WBC 11.9 (*)    All other components within normal limits  ACETAMINOPHEN LEVEL - Abnormal; Notable for the following:    Acetaminophen (Tylenol), Serum <10 (*)    All other components within normal limits  SALICYLATE LEVEL  ETHANOL  PREGNANCY, URINE  RAPID URINE DRUG SCREEN, HOSP PERFORMED    EKG  EKG Interpretation None       Radiology No results found.  Procedures Procedures (including critical care time)  Medications Ordered in ED Medications  LORazepam (ATIVAN)  injection 0-4 mg (0 mg Intravenous Not Given 04/13/17 0801)    Or  LORazepam (ATIVAN) tablet 0-4 mg ( Oral See Alternative 04/13/17 0801)  LORazepam (ATIVAN) injection 0-4 mg (not administered)    Or  LORazepam (ATIVAN) tablet 0-4 mg (not administered)  thiamine (VITAMIN B-1) tablet 100 mg (not administered)    Or  thiamine (B-1) injection 100 mg (not administered)  acetaminophen (TYLENOL) tablet 650 mg (not administered)  zolpidem (AMBIEN) tablet 5 mg (not administered)  ondansetron (ZOFRAN) tablet 4 mg (not administered)  alum & mag hydroxide-simeth (MAALOX/MYLANTA) 200-200-20 MG/5ML suspension 30 mL (not administered)  nicotine (NICODERM CQ - dosed in mg/24 hours) patch 21 mg (not administered)     Initial Impression / Assessment and Plan / ED Course  I have reviewed the triage vital signs and the nursing notes.  Pertinent labs & imaging results that were available during my care of the patient were reviewed by me and considered in my medical decision making (see chart for details).     BP 99/79   Pulse 79   Temp 98 F (36.7 C) (Oral)   Resp 16   LMP  (LMP Unknown)   SpO2 99%    Final Clinical Impressions(s) / ED Diagnoses   Final diagnoses:  Suicidal ideation  Polysubstance abuse    New Prescriptions New Prescriptions   No medications on file   7:19 AM Patient is expressing suicidal thoughts with plan to overdose on crack and cocaine. Admits to cocaine use last night. Plan to perform medical screening exam and will consult TTS for further management.  9:50 AM Pt is medically cleared for further psychiatric evaluation.    Fayrene Helper, PA-C 04/13/17 1610    Shaune Pollack, MD 04/14/17 [phone]

## 2017-04-13 NOTE — ED Notes (Addendum)
This Clinical research associatewriter reviewed discharge paperwork with the pt; pt verbalizes mom is downtown and cannot come get her. Bus pass given but pt refuses pass and to sign discharge paperwork. Pt walked to lobby with discharge paperwork and bus pass in hand; Jerilee HohStacy West, charge RN aware.

## 2017-04-13 NOTE — ED Notes (Signed)
paitent given sandwich

## 2017-04-13 NOTE — ED Notes (Signed)
Pt brought in by [employer] voluntary  Pt claims to be suicidal with alcohol and drug problems

## 2017-04-13 NOTE — BH Assessment (Addendum)
[facility] Assessment Progress Note  Per Nanine MeansJamison Lord, DNP, this pt does not require psychiatric hospitalization at this time.  [facility] to follow up with [facility].  This has been included in pt's discharge instructions.  Pt's nurse, Lequita HaltMorgan, has been notified.  Doylene Canninghomas Kemiah Booz, MA Triage Specialist [phone]

## 2018-07-26 ENCOUNTER — Emergency Department: Payer: Self-pay

## 2018-07-26 ENCOUNTER — Emergency Department
Admission: EM | Admit: 2018-07-26 | Discharge: 2018-07-26 | Disposition: A | Discharge status: Home / Self Care | Payer: Self-pay | Attending: Emergency Medicine | Admitting: Emergency Medicine

## 2018-07-26 ENCOUNTER — Other Ambulatory Visit: Payer: Self-pay

## 2018-07-26 ENCOUNTER — Encounter: Payer: Self-pay | Admitting: Emergency Medicine

## 2018-07-26 DIAGNOSIS — J029 Acute pharyngitis, unspecified: Secondary | ICD-10-CM | POA: Insufficient documentation

## 2018-07-26 DIAGNOSIS — F17200 Nicotine dependence, unspecified, uncomplicated: Secondary | ICD-10-CM | POA: Insufficient documentation

## 2018-07-26 DIAGNOSIS — J069 Acute upper respiratory infection, unspecified: Secondary | ICD-10-CM | POA: Insufficient documentation

## 2018-07-26 LAB — POCT PREGNANCY, URINE: Preg Test, Ur: NEGATIVE

## 2018-07-26 MED ORDER — AMOXICILLIN 500 MG PO CAPS
500.0000 mg | ORAL_CAPSULE | Freq: Once | ORAL | Status: AC
  Administered 2018-07-26: 500 mg via ORAL
  Filled 2018-07-26: qty 1

## 2018-07-26 MED ORDER — AMOXICILLIN 500 MG PO TABS: 500.0000 mg | ORAL_TABLET | Freq: Three times a day (TID) | ORAL | 0 refills | Status: DC

## 2018-07-26 NOTE — ED Notes (Signed)
See triage note  Presents with cough,congestion and sore throat  States she developed sx's after walking in the rain last Monday  Unsure of fever but states she has felt "hot"  Having increased pain with swallowing

## 2018-07-26 NOTE — Discharge Instructions (Signed)
Take tylenol or ibuprofen for pain or fever. Follow up with primary care if not improving over the week. Return to the ER for symptoms that change or worsen if unable to schedule an appointment.

## 2018-07-26 NOTE — ED Triage Notes (Signed)
Cough-productive and sore throat and left ear pain. Taking dayquil.  Sick x 1 week.  Also some diarrhea now. Says hurts to swollow

## 2018-07-26 NOTE — ED Provider Notes (Signed)
[facility] Emergency Department Provider Note  ____________________________________________  Time seen: Approximately 8:04 AM  I have reviewed the triage vital signs and the nursing notes.   HISTORY  Chief Complaint Sore Throat and Cough   HPI Jordan Walton is a 27 y.o. female who presents to the emergency department for treatment and evaluation of cough, congestion, and sore throat.  Symptoms started about 1 week ago.  Had increase in pain swallowing as well as left earache today. No relief with OTC medications.   History reviewed. No pertinent past medical history.  Patient Active Problem List   Diagnosis Date Noted  . Cocaine abuse (HCC) 04/13/2017  . Cocaine abuse with cocaine-induced mood disorder (HCC) 04/13/2017    History reviewed. No pertinent surgical history.  Prior to Admission medications   Medication Sig Start Date End Date Taking? Authorizing Provider  amoxicillin (AMOXIL) 500 MG tablet Take 1 tablet (500 mg total) by mouth 3 (three) times daily. 07/26/18   Janequa Kipnis B, FNP  cephALEXin (KEFLEX) 500 MG capsule Take 1 capsule (500 mg total) by mouth 4 (four) times daily. Patient not taking: Reported on 04/13/2017 03/29/17   Muthersbaugh, Dahlia Client, PA-C    Allergies Patient has no known allergies.  No family history on file.  Social History Social History   Tobacco Use  . Smoking status: Current Every Day Smoker  . Smokeless tobacco: Never Used  Substance Use Topics  . Alcohol use: Not on file  . Drug use: Yes    Comment: Crack    Review of Systems Constitutional: Positive for fever/chills. Decreased appetite. ENT: Positive for sore throat. Cardiovascular: Denies chest pain. Respiratory: Negative for shortness of breath. Positive for cough. Negative for wheezing.  Gastrointestinal: No nausea,  no vomiting.  Positive for diarrhea.  Musculoskeletal: Negative for body aches Skin: Negative for rash. Neurological:  Negative for headaches ____________________________________________   PHYSICAL EXAM:  VITAL SIGNS: ED Triage Vitals  Enc Vitals Group     BP 07/26/18 0731 123/75     Pulse Rate 07/26/18 0731 81     Resp 07/26/18 0731 20     Temp 07/26/18 0731 98.8 F (37.1 C)     Temp Source 07/26/18 0731 Oral     SpO2 07/26/18 0731 97 %     Weight 07/26/18 0723 180 lb (81.6 kg)     Height 07/26/18 0723 4\' 11"  (1.499 m)     Head Circumference --      Peak Flow --      Pain Score 07/26/18 0723 10     Pain Loc --      Pain Edu? --      Excl. in GC? --     Constitutional: Alert and oriented. Well appearing and in no acute distress. Eyes: Conjunctivae are normal. Ears: Bilateral TM injected. Nose: No sinus congestion noted; no rhinnorhea. Mouth/Throat: Mucous membranes are moist.  Oropharynx erythematous. Tonsils erythematous. Uvula midline. Neck: No stridor.  Lymphatic: No cervical lymphadenopathy. Cardiovascular: Normal rate, regular rhythm. Good peripheral circulation. Respiratory: Respirations are even and unlabored.  No retractions. Breath sounds clear. Gastrointestinal: Soft and nontender.  Musculoskeletal: FROM x 4 extremities.  Neurologic:  Normal speech and language. Skin:  Skin is warm, dry and intact. No rash noted. Psychiatric: Mood and affect are normal. Speech and behavior are normal.  ____________________________________________   LABS (all labs ordered are listed, but only abnormal results are displayed)  Labs Reviewed  POC URINE PREG, ED  POCT PREGNANCY, URINE  ____________________________________________  EKG  Not indicated. ____________________________________________  RADIOLOGY  Chest x-ray negative for acute findings. ____________________________________________   PROCEDURES  Procedure(s) performed: None  Critical Care performed: No ____________________________________________   INITIAL IMPRESSION / ASSESSMENT AND PLAN / ED COURSE  27 y.o.  female who presents to the emergency department for treatment and evaluation of symptoms and exam most consistent with URI.  She also has had a sore throat for over a week.  She will be treated with amoxicillin and advised to continue the over-the-counter cough medications if needed.  She will take Tylenol or ibuprofen if needed as well.  She was encouraged to follow-up with her primary care provider for symptoms that are not improving over the week or so.  She was encouraged to return to the emergency department for symptoms of change or worsen if unable to schedule an appointment.   Medications  amoxicillin (AMOXIL) capsule 500 mg (500 mg Oral Given 07/26/18 0931)    ED Discharge Orders         Ordered    amoxicillin (AMOXIL) 500 MG tablet  3 times daily     07/26/18 0920           Pertinent labs & imaging results that were available during my care of the patient were reviewed by me and considered in my medical decision making (see chart for details).    If controlled substance prescribed during this visit, 12 month history viewed on the [facility] prior to issuing an initial prescription for Schedule II or III opiod. ____________________________________________   FINAL CLINICAL IMPRESSION(S) / ED DIAGNOSES  Final diagnoses:  Sore throat  Upper respiratory tract infection, unspecified type    Note:  This document was prepared using Dragon voice recognition software and may include unintentional dictation errors.     Chinita Pester, FNP 07/26/18 1411    Governor Rooks, MD 07/26/18 1600

## 2018-09-14 ENCOUNTER — Emergency Department (HOSPITAL_COMMUNITY)
Admission: EM | Admit: 2018-09-14 | Discharge: 2018-09-15 | Disposition: A | Discharge status: Home / Self Care | Payer: Self-pay | Attending: Emergency Medicine | Admitting: Emergency Medicine

## 2018-09-14 DIAGNOSIS — Z590 Homelessness: Secondary | ICD-10-CM | POA: Insufficient documentation

## 2018-09-14 DIAGNOSIS — F1910 Other psychoactive substance abuse, uncomplicated: Secondary | ICD-10-CM | POA: Insufficient documentation

## 2018-09-14 DIAGNOSIS — Z79899 Other long term (current) drug therapy: Secondary | ICD-10-CM | POA: Insufficient documentation

## 2018-09-14 NOTE — ED Triage Notes (Signed)
Patient coming into ED for recent drug abuse with "crack cocaine". Requesting detox. Last used 3 hours ago. No pain.

## 2018-09-15 NOTE — ED Notes (Signed)
Pt seen sleeping in lobby. Pt reassessed.

## 2018-09-15 NOTE — ED Notes (Signed)
Pt called for vitals x3. 

## 2018-09-15 NOTE — ED Notes (Addendum)
Pt called for vitals x2 

## 2018-09-15 NOTE — ED Notes (Addendum)
Pt called for vitals x1 

## 2018-09-15 NOTE — ED Provider Notes (Signed)
[facility] EMERGENCY DEPARTMENT Provider Note   CSN: [id number] Arrival date & time: 09/14/18  2000     History   Chief Complaint No chief complaint on file.   HPI Jordan Walton is a 27 y.o. female.  History of cocaine abuse and she wants to quit secondary to the fact it is taken over her life and she is homeless as well.  She is not suicidal or homicidal nor is she exhibiting any psychosis.  She is tearful on examination but has no other significant complaints.  She is been doing cocaine for a long time.  She just decided to quit tonight.  No other exacerbating relieving factors.  No other alcohol or drugs.     No past medical history on file.  Patient Active Problem List   Diagnosis Date Noted  . Cocaine abuse (HCC) 04/13/2017  . 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   OB History   No obstetric history on file.      Home Medications    Prior to Admission medications   Medication Sig Start Date End Date Taking? Authorizing Provider  amoxicillin (AMOXIL) 500 MG tablet Take 1 tablet (500 mg total) by mouth 3 (three) times daily. 07/26/18   Triplett, Cari B, FNP  cephALEXin (KEFLEX) 500 MG capsule Take 1 capsule (500 mg total) by mouth 4 (four) times daily. Patient not taking: Reported on 04/13/2017 03/29/17   Muthersbaugh, Dahlia Client, PA-C    Family History No family history on file.  Social History Social History   Tobacco Use  . Smoking status: Current Every Day Smoker  . Smokeless tobacco: Never Used  Substance Use Topics  . Alcohol use: Not on file  . Drug use: Yes    Comment: Crack     Allergies   Patient has no known allergies.   Review of Systems Review of Systems  All other systems reviewed and are negative.    Physical Exam Updated Vital Signs BP (!) 87/67 (BP Location: Left Arm)   Pulse 85   Temp 98.1 F (36.7 C) (Oral)   Resp 16   SpO2 98%    Physical Exam Vitals signs and nursing note reviewed.  Constitutional:      Appearance: She is well-developed.  HENT:     Head: Normocephalic and atraumatic.  Neck:     Musculoskeletal: Normal range of motion.  Cardiovascular:     Rate and Rhythm: Normal rate and regular rhythm.  Pulmonary:     Effort: No respiratory distress.     Breath sounds: No stridor.  Abdominal:     General: There is no distension.  Neurological:     Mental Status: She is alert.  Psychiatric:        Attention and Perception: Attention and perception normal.        Mood and Affect: Affect is tearful.        Speech: Speech normal.        Behavior: Behavior normal.        Thought Content: Thought content is not paranoid or delusional. Thought content does not include homicidal or suicidal ideation. Thought content does not include homicidal or suicidal plan.      ED Treatments / Results  Labs (all labs ordered are listed, but only abnormal results are displayed) Labs Reviewed - No data to display  EKG None  Radiology No results found.  Procedures Procedures (including critical care  time)  Medications Ordered in ED Medications - No data to display   Initial Impression / Assessment and Plan / ED Course  I have reviewed the triage vital signs and the nursing notes.  Pertinent labs & imaging results that were available during my care of the patient were reviewed by me and considered in my medical decision making (see chart for details).    I discussed with her the limitations of the emergency department especially at this time a day.  The print out for outpatient resources were given.  She will return here if she is having any suicidal or homicidal ideation.   Final Clinical Impressions(s) / ED Diagnoses   Final diagnoses:  None    ED Discharge Orders    None       Dayton Kenley, Barbara CowerJason, MD 09/15/18 [phone]

## 2019-03-30 ENCOUNTER — Emergency Department (HOSPITAL_COMMUNITY)
Admission: EM | Admit: 2019-03-30 | Discharge: 2019-03-30 | Disposition: A | Discharge status: Other Acute Inpatient Hospital | Payer: Self-pay | Attending: Emergency Medicine | Admitting: Emergency Medicine

## 2019-03-30 ENCOUNTER — Emergency Department (HOSPITAL_COMMUNITY): Payer: Self-pay

## 2019-03-30 ENCOUNTER — Other Ambulatory Visit: Payer: Self-pay

## 2019-03-30 ENCOUNTER — Encounter (HOSPITAL_COMMUNITY): Payer: Self-pay

## 2019-03-30 DIAGNOSIS — F1490 Cocaine use, unspecified, uncomplicated: Secondary | ICD-10-CM | POA: Insufficient documentation

## 2019-03-30 DIAGNOSIS — R45851 Suicidal ideations: Secondary | ICD-10-CM | POA: Insufficient documentation

## 2019-03-30 DIAGNOSIS — Z03818 Encounter for observation for suspected exposure to other biological agents ruled out: Secondary | ICD-10-CM | POA: Insufficient documentation

## 2019-03-30 DIAGNOSIS — F17200 Nicotine dependence, unspecified, uncomplicated: Secondary | ICD-10-CM | POA: Insufficient documentation

## 2019-03-30 LAB — CBC WITH DIFFERENTIAL/PLATELET
Abs Immature Granulocytes: 0.04 10*3/uL (ref 0.00–0.07)
Basophils Absolute: 0.1 10*3/uL (ref 0.0–0.1)
Basophils Relative: 1 %
Eosinophils Absolute: 0.4 10*3/uL (ref 0.0–0.5)
Eosinophils Relative: 3 %
HCT: 39.7 % (ref 36.0–46.0)
Hemoglobin: 12.3 g/dL (ref 12.0–15.0)
Immature Granulocytes: 0 %
Lymphocytes Relative: 41 %
Lymphs Abs: 4.8 10*3/uL — ABNORMAL HIGH (ref 0.7–4.0)
MCH: 27.6 pg (ref 26.0–34.0)
MCHC: 31 g/dL (ref 30.0–36.0)
MCV: 89 fL (ref 80.0–100.0)
Monocytes Absolute: 0.8 10*3/uL (ref 0.1–1.0)
Monocytes Relative: 7 %
Neutro Abs: 5.5 10*3/uL (ref 1.7–7.7)
Neutrophils Relative %: 48 %
Platelets: 441 10*3/uL — ABNORMAL HIGH (ref 150–400)
RBC: 4.46 MIL/uL (ref 3.87–5.11)
RDW: 14.2 % (ref 11.5–15.5)
WBC: 11.6 10*3/uL — ABNORMAL HIGH (ref 4.0–10.5)
nRBC: 0 % (ref 0.0–0.2)

## 2019-03-30 LAB — I-STAT BETA HCG BLOOD, ED (MC, WL, AP ONLY): I-stat hCG, quantitative: <5 m[IU]/mL (ref <5)

## 2019-03-30 LAB — COMPREHENSIVE METABOLIC PANEL
ALT: 23 U/L (ref 0–44)
AST: 32 U/L (ref 15–41)
Albumin: 3.8 g/dL (ref 3.5–5.0)
Alkaline Phosphatase: 70 U/L (ref 38–126)
Anion gap: 9 (ref 5–15)
BUN: 8 mg/dL (ref 6–20)
CO2: 22 mmol/L (ref 22–32)
Calcium: 9 mg/dL (ref 8.9–10.3)
Chloride: 107 mmol/L (ref 98–111)
Creatinine, Ser: 0.86 mg/dL (ref 0.44–1.00)
GFR calc Af Amer: >60 mL/min (ref >60)
GFR calc non Af Amer: >60 mL/min (ref >60)
Glucose, Bld: 86 mg/dL (ref 70–99)
Potassium: 4.4 mmol/L (ref 3.5–5.1)
Sodium: 138 mmol/L (ref 135–145)
Total Bilirubin: 0.2 mg/dL — ABNORMAL LOW (ref 0.3–1.2)
Total Protein: 7.4 g/dL (ref 6.5–8.1)

## 2019-03-30 LAB — ETHANOL: Alcohol, Ethyl (B): <10 mg/dL (ref <10)

## 2019-03-30 LAB — RAPID URINE DRUG SCREEN, HOSP PERFORMED
Amphetamines: NOT DETECTED
Barbiturates: NOT DETECTED
Benzodiazepines: NOT DETECTED
Cocaine: POSITIVE — AB
Opiates: NOT DETECTED
Tetrahydrocannabinol: NOT DETECTED

## 2019-03-30 LAB — SARS CORONAVIRUS 2 BY RT PCR (HOSPITAL ORDER, PERFORMED IN ~~LOC~~ HOSPITAL LAB): SARS Coronavirus 2: NEGATIVE

## 2019-03-30 NOTE — ED Notes (Signed)
Called [facility] for pt

## 2019-03-30 NOTE — ED Notes (Signed)
Pt ambulatory to and from restroom with steady gait 

## 2019-03-30 NOTE — ED Notes (Signed)
Diet tray ordered 

## 2019-03-30 NOTE — ED Notes (Signed)
Per Christyann, RN, via Dr. Jessy Oto at [facility], pt okay to come back to [facility] after chest x ray results are faxed over

## 2019-03-30 NOTE — ED Notes (Signed)
Pt changed into purple scrubs, belongings removed from room, pt wanded by security

## 2019-03-30 NOTE — ED Triage Notes (Addendum)
Pt from [facility] via ems; use of crack cocaine in last hour; pt trying to get into [facility] for rehab; P 150, BP 158/84 at [facility]; denies pain, denies illness; need medical clearance to be admitted to [facility]; pt states she returned from [place] Thursday of last week; was in a high risk Covid area  142/76 P 105 98% RA RR 20

## 2019-03-30 NOTE — ED Notes (Signed)
Pt given food and beverage per Dr. Bero 

## 2019-03-30 NOTE — ED Notes (Signed)
Dinner tray at bedside

## 2019-03-30 NOTE — ED Provider Notes (Signed)
[facility] Emergency Department Provider Note MRN:  [id number]  Arrival date & time: 03/30/19     Chief Complaint   Medical Clearance   History of Present Illness   Jordan Walton is a 28 y.o. year-old female with a history of polysubstance abuse presenting to the ED with chief complaint of medical clearance.  Patient explains that she was 7 months sober, but returned to [place] from [place] 1 to 2 weeks ago and relapsed on crack cocaine.  Patient denies ever using IV drugs, usually smokes the drug.  Last use 1 to 2 hours ago.  For the past 1 or 2 days, has been feeling worthless, expressing suicidal ideation, feeling like her struggle would be over if she were dead.  Denies chest pain or shortness of breath, no fever, no cough, no abdominal pain, no other complaints.  Also endorsing occasional alcohol, marijuana.  Denies AVH.  Review of Systems  A complete 10 system review of systems was obtained and all systems are negative except as noted in the HPI and PMH.   Patient's Health History   History reviewed. No pertinent past medical history.  History reviewed. No pertinent surgical history.  No family history on file.  Social History   Socioeconomic History  . Marital status: Single    Spouse name: Not on file  . Number of children: Not on file  . Years of education: Not on file  . Highest education level: Not on file  Occupational History  . Not on file  Social Needs  . Financial resource strain: Not on file  . Food insecurity    Worry: Not on file    Inability: Not on file  . Transportation needs    Medical: Not on file    Non-medical: Not on file  Tobacco Use  . Smoking status: Current Every Day Smoker  . Smokeless tobacco: Never Used  Substance and Sexual Activity  . Alcohol use: Yes    Comment: 12 pack/ day, last drank 2 hours ago  . Drug use: Yes    Types: Cocaine    Comment: Crack  . Sexual activity: Not on file  Lifestyle  .  Physical activity    Days per week: Not on file    Minutes per session: Not on file  . Stress: Not on file  Relationships  . Social Herbalist on phone: Not on file    Gets together: Not on file    Attends religious service: Not on file    Active member of club or organization: Not on file    Attends meetings of clubs or organizations: Not on file    Relationship status: Not on file  . Intimate partner violence    Fear of current or ex partner: Not on file    Emotionally abused: Not on file    Physically abused: Not on file    Forced sexual activity: Not on file  Other Topics Concern  . Not on file  Social History Narrative  . Not on file     Physical Exam  Vital Signs and Nursing Notes reviewed Vitals:   03/30/19 1340  BP: 136/86  Pulse: 92  Resp: 18  Temp: 98.4 F (36.9 C)  SpO2: 98%    CONSTITUTIONAL: Well-appearing, NAD NEURO:  Alert and oriented x 3, no focal deficits EYES:  eyes equal and reactive ENT/NECK:  no LAD, no JVD CARDIO: Regular rate, well-perfused, normal S1 and S2  PULM:  CTAB no wheezing or rhonchi GI/GU:  normal bowel sounds, non-distended, non-tender MSK/SPINE:  No gross deformities, no edema SKIN:  no rash, atraumatic PSYCH:  Appropriate speech and behavior  Diagnostic and Interventional Summary    EKG Interpretation  Date/Time:  Saturday March 30 2019 13:54:43 EDT Ventricular Rate:  86 PR Interval:    QRS Duration: 89 QT Interval:  379 QTC Calculation: 454 R Axis:   91 Text Interpretation:  Sinus rhythm Borderline right axis deviation Confirmed by Kennis CarinaBero, Robbin Loughmiller [phone]([id number]) on 03/30/2019 1:59:27 PM      Labs Reviewed  SARS CORONAVIRUS 2 (HOSPITAL ORDER, PERFORMED IN [facility])  COMPREHENSIVE METABOLIC PANEL  ETHANOL  RAPID URINE DRUG SCREEN, HOSP PERFORMED  CBC WITH DIFFERENTIAL/PLATELET  I-STAT BETA HCG BLOOD, ED ([facility], [facility], [facility] ONLY)    No orders to display    Medications - No data to display   Procedures  Critical Care  ED Course and Medical Decision Making  I have reviewed the triage vital signs and the nursing notes.  Pertinent labs & imaging results that were available during my care of the patient were reviewed by me and considered in my medical decision making (see below for details).  Relapse on crack cocaine with suicidal ideation, no specific plan.  Will consult TTS for evaluation, awaiting laboratory assessment but anticipating medical clearance.  Patient would likely benefit from being reenrolled in an outpatient rehab center.  Signed out to default provider at shift change.  Elmer SowMichael M. Pilar PlateBero, MD [facility] [email or web address]  Final Clinical Impressions(s) / ED Diagnoses     ICD-10-CM   1. Suicidal ideation  R45.851   2. Crack cocaine use  F14.90     ED Discharge Orders    None         Sabas SousBero, Floyce Bujak M, MD 03/30/19 1401

## 2019-04-10 ENCOUNTER — Encounter (HOSPITAL_COMMUNITY): Payer: Self-pay | Admitting: Obstetrics and Gynecology

## 2019-04-10 ENCOUNTER — Emergency Department (HOSPITAL_COMMUNITY)
Admission: EM | Admit: 2019-04-10 | Discharge: 2019-04-11 | Disposition: A | Discharge status: Other Acute Inpatient Hospital | Payer: Self-pay | Attending: Emergency Medicine | Admitting: Emergency Medicine

## 2019-04-10 ENCOUNTER — Other Ambulatory Visit: Payer: Self-pay

## 2019-04-10 DIAGNOSIS — R45851 Suicidal ideations: Secondary | ICD-10-CM | POA: Insufficient documentation

## 2019-04-10 DIAGNOSIS — F1910 Other psychoactive substance abuse, uncomplicated: Secondary | ICD-10-CM

## 2019-04-10 DIAGNOSIS — F1414 Cocaine abuse with cocaine-induced mood disorder: Secondary | ICD-10-CM | POA: Insufficient documentation

## 2019-04-10 DIAGNOSIS — F17200 Nicotine dependence, unspecified, uncomplicated: Secondary | ICD-10-CM | POA: Insufficient documentation

## 2019-04-10 DIAGNOSIS — F1010 Alcohol abuse, uncomplicated: Secondary | ICD-10-CM | POA: Insufficient documentation

## 2019-04-10 DIAGNOSIS — Z03818 Encounter for observation for suspected exposure to other biological agents ruled out: Secondary | ICD-10-CM | POA: Insufficient documentation

## 2019-04-10 HISTORY — DX: Cocaine use, unspecified, uncomplicated: F14.90

## 2019-04-10 LAB — COMPREHENSIVE METABOLIC PANEL
ALT: 25 U/L (ref 0–44)
AST: 42 U/L — ABNORMAL HIGH (ref 15–41)
Albumin: 3.9 g/dL (ref 3.5–5.0)
Alkaline Phosphatase: 59 U/L (ref 38–126)
Anion gap: 11 (ref 5–15)
BUN: 10 mg/dL (ref 6–20)
CO2: 22 mmol/L (ref 22–32)
Calcium: 8.5 mg/dL — ABNORMAL LOW (ref 8.9–10.3)
Chloride: 101 mmol/L (ref 98–111)
Creatinine, Ser: 0.85 mg/dL (ref 0.44–1.00)
GFR calc Af Amer: >60 mL/min (ref >60)
GFR calc non Af Amer: >60 mL/min (ref >60)
Glucose, Bld: 95 mg/dL (ref 70–99)
Potassium: 4.2 mmol/L (ref 3.5–5.1)
Sodium: 134 mmol/L — ABNORMAL LOW (ref 135–145)
Total Bilirubin: 0.3 mg/dL (ref 0.3–1.2)
Total Protein: 8 g/dL (ref 6.5–8.1)

## 2019-04-10 LAB — CBC WITH DIFFERENTIAL/PLATELET
Abs Immature Granulocytes: 0.04 10*3/uL (ref 0.00–0.07)
Basophils Absolute: 0.1 10*3/uL (ref 0.0–0.1)
Basophils Relative: 1 %
Eosinophils Absolute: 0.2 10*3/uL (ref 0.0–0.5)
Eosinophils Relative: 2 %
HCT: 36.8 % (ref 36.0–46.0)
Hemoglobin: 11.7 g/dL — ABNORMAL LOW (ref 12.0–15.0)
Immature Granulocytes: 0 %
Lymphocytes Relative: 30 %
Lymphs Abs: 4 10*3/uL (ref 0.7–4.0)
MCH: 28.2 pg (ref 26.0–34.0)
MCHC: 31.8 g/dL (ref 30.0–36.0)
MCV: 88.7 fL (ref 80.0–100.0)
Monocytes Absolute: 1 10*3/uL (ref 0.1–1.0)
Monocytes Relative: 8 %
Neutro Abs: 7.8 10*3/uL — ABNORMAL HIGH (ref 1.7–7.7)
Neutrophils Relative %: 59 %
Platelets: 302 10*3/uL (ref 150–400)
RBC: 4.15 MIL/uL (ref 3.87–5.11)
RDW: 14.1 % (ref 11.5–15.5)
WBC: 13.1 10*3/uL — ABNORMAL HIGH (ref 4.0–10.5)
nRBC: 0 % (ref 0.0–0.2)

## 2019-04-10 LAB — RAPID URINE DRUG SCREEN, HOSP PERFORMED
Amphetamines: POSITIVE — AB
Barbiturates: NOT DETECTED
Benzodiazepines: NOT DETECTED
Cocaine: POSITIVE — AB
Opiates: NOT DETECTED
Tetrahydrocannabinol: NOT DETECTED

## 2019-04-10 LAB — ETHANOL: Alcohol, Ethyl (B): 10 mg/dL — ABNORMAL HIGH (ref <10)

## 2019-04-10 LAB — SARS CORONAVIRUS 2 BY RT PCR (HOSPITAL ORDER, PERFORMED IN ~~LOC~~ HOSPITAL LAB): SARS Coronavirus 2: NEGATIVE

## 2019-04-10 LAB — I-STAT BETA HCG BLOOD, ED (MC, WL, AP ONLY): I-stat hCG, quantitative: <5 m[IU]/mL (ref <5)

## 2019-04-10 NOTE — Progress Notes (Signed)
Pt meets inpatient criteria per Earleen Newport, NP. Referral information has been sent to the following hospitals for review:  [facility]  [facility]  [facility]  [facility]  [facility]  [facility]      Disposition will continue to assist with inpatient patient needs.   Audree Camel, LCSW, [place] Disposition [facility]/TTS [phone]
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
## 2019-04-10 NOTE — ED Notes (Signed)
Pt A&O x 3, depressed, remains SI. Calm & cooperative, no distress noted.  Monitoring for safety, 1-1 sitter at bedside.
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
## 2019-04-10 NOTE — ED Notes (Signed)
Pt arrived calm and cooperative to room 29.  Pt states she is S/I with a plan to jump in traffic.  She states she doesn't know why she cant get off drugs.  She was given a soda and a sandwich.  Sitter with patient.

## 2019-04-10 NOTE — ED Triage Notes (Signed)
Pt is coming from [facility] via an EMS: Pt reportedly using crack cocaine, alcohol and went back to [facility] after a 12 day stay previously. While sitting out in the lobby, she had an episode of emesis. [facility] wanted her to be evaluated for possible aspiration.  Pt got 500 cc of fluids with EMS.

## 2019-04-10 NOTE — ED Notes (Signed)
Bed: WA02 Expected date:  Expected time:  Means of arrival:  Comments: EMS/ETOH/1096
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
## 2019-04-11 ENCOUNTER — Encounter (HOSPITAL_COMMUNITY): Payer: Self-pay

## 2019-04-11 ENCOUNTER — Other Ambulatory Visit: Payer: Self-pay

## 2019-04-11 ENCOUNTER — Inpatient Hospital Stay (HOSPITAL_COMMUNITY)
Admission: AD | Admit: 2019-04-11 | Discharge: 2019-04-15 | DRG: 897 | Disposition: A | Discharge status: Home / Self Care | Payer: Federal, State, Local not specified - Other | Attending: Psychiatry | Admitting: Psychiatry

## 2019-04-11 DIAGNOSIS — F1414 Cocaine abuse with cocaine-induced mood disorder: Principal | ICD-10-CM | POA: Diagnosis present

## 2019-04-11 DIAGNOSIS — F329 Major depressive disorder, single episode, unspecified: Secondary | ICD-10-CM | POA: Diagnosis not present

## 2019-04-11 DIAGNOSIS — Z590 Homelessness: Secondary | ICD-10-CM | POA: Diagnosis not present

## 2019-04-11 DIAGNOSIS — Z818 Family history of other mental and behavioral disorders: Secondary | ICD-10-CM

## 2019-04-11 DIAGNOSIS — F1994 Other psychoactive substance use, unspecified with psychoactive substance-induced mood disorder: Secondary | ICD-10-CM | POA: Diagnosis present

## 2019-04-11 DIAGNOSIS — G4700 Insomnia, unspecified: Secondary | ICD-10-CM | POA: Diagnosis present

## 2019-04-11 DIAGNOSIS — R45851 Suicidal ideations: Secondary | ICD-10-CM | POA: Diagnosis present

## 2019-04-11 DIAGNOSIS — Z811 Family history of alcohol abuse and dependence: Secondary | ICD-10-CM | POA: Diagnosis not present

## 2019-04-11 DIAGNOSIS — F17210 Nicotine dependence, cigarettes, uncomplicated: Secondary | ICD-10-CM | POA: Diagnosis present

## 2019-04-11 DIAGNOSIS — F1014 Alcohol abuse with alcohol-induced mood disorder: Secondary | ICD-10-CM | POA: Diagnosis present

## 2019-04-11 MED ORDER — FLUOXETINE HCL 20 MG PO TABS
20.0000 mg | ORAL_TABLET | Freq: Every day | ORAL | Status: DC
  Administered 2019-04-11: 17:00:00 20 mg via ORAL
  Filled 2019-04-11 (×4): qty 1

## 2019-04-11 MED ORDER — ALUM & MAG HYDROXIDE-SIMETH 200-200-20 MG/5ML PO SUSP: 30.0000 mL | ORAL | Status: DC | PRN

## 2019-04-11 MED ORDER — HYDROXYZINE HCL 25 MG PO TABS
25.0000 mg | ORAL_TABLET | Freq: Three times a day (TID) | ORAL | Status: DC | PRN
  Administered 2019-04-11 – 2019-04-12 (×2): 25 mg via ORAL
  Filled 2019-04-11 (×2): qty 1

## 2019-04-11 MED ORDER — TRAZODONE HCL 50 MG PO TABS
50.0000 mg | ORAL_TABLET | Freq: Every evening | ORAL | Status: DC | PRN
  Administered 2019-04-12 – 2019-04-13 (×2): 50 mg via ORAL
  Filled 2019-04-11: qty 1

## 2019-04-11 MED ORDER — QUETIAPINE FUMARATE 200 MG PO TABS
200.0000 mg | ORAL_TABLET | Freq: Every day | ORAL | Status: DC
  Administered 2019-04-11: 200 mg via ORAL
  Filled 2019-04-11 (×3): qty 1

## 2019-04-11 MED ORDER — ACETAMINOPHEN 325 MG PO TABS: 650.0000 mg | ORAL_TABLET | Freq: Four times a day (QID) | ORAL | Status: DC | PRN

## 2019-04-11 MED ORDER — MAGNESIUM HYDROXIDE 400 MG/5ML PO SUSP: 30.0000 mL | Freq: Every day | ORAL | Status: DC | PRN

## 2019-04-11 NOTE — Progress Notes (Signed)
D: Pt SI/ AVH- pt very irritable and guarded. Pt not forthcoming with information this evening" leave me alone" pt kept to herself in her room this evening.  A: Pt was offered support and encouragement.  Pt was encourage to attend groups. Q 15 minute checks were done for safety.   R: safety maintained on unit.

## 2019-04-11 NOTE — ED Provider Notes (Signed)
She has been accepted at the [facility], for further care.   Daleen Bo, MD 04/11/19 1345

## 2019-04-11 NOTE — Tx Team (Signed)
Initial Treatment Plan 04/11/2019 5:33 PM [name] YJE:[id number]    PATIENT STRESSORS: Financial difficulties Occupational concerns Substance abuse   PATIENT STRENGTHS: Ability for insight Active sense of humor Average or above average intelligence   PATIENT IDENTIFIED PROBLEMS: "I need somewhere to stay"  withdrawal                   DISCHARGE CRITERIA:  Ability to meet basic life and health needs Adequate post-discharge living arrangements Improved stabilization in mood, thinking, and/or behavior  PRELIMINARY DISCHARGE PLAN: Outpatient therapy  PATIENT/FAMILY INVOLVEMENT: This treatment plan has been presented to and reviewed with the patient, Jordan Walton, and/or family member.  The patient and family have been given the opportunity to ask questions and make suggestions.  Megan Mans, RN 04/11/2019, 5:33 PM

## 2019-04-11 NOTE — Progress Notes (Addendum)
Pt accepted to  [facility], Bed 306-1 Jordan Garnet, MD is the accepting/attending provider.  Call report to [phone]  Tom H.@[facility]  ED notified.   Pt is IVC'd  Pt may be transported by [employer]  Pt scheduled  to arrive at [facility] as soon as transport can be arranged.  [name]. Judi Cong, MSW, [place] Disposition Clinical Social Work [phone] (cell) [phone] (office)

## 2019-04-11 NOTE — BH Assessment (Signed)
[facility] Assessment Progress Note  Per Buford Dresser, DO, this pt requires psychiatric hospitalization.  Letitia Libra, RN, [facility] has assigned pt to [facility] Rm 306-1.  Pt presents under IVC initiated by [facility] staff, and upheld by Dr Mariea Clonts, and IVC documents have been faxed to [facility].  Pt's nurse, Eustaquio Maize, has been notified, and agrees to call report to [phone].  Pt is to be transported via [employer].   Jalene Mullet, [place] Coordinator [phone]
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
## 2019-04-11 NOTE — ED Notes (Signed)
Pt off unit to [facility] per provider. Pt alert, calm, cooperative, no s/s of distress. Pt DC information given to [employer] for [facility] facility. Belongings given to [facility] for [facility] facility. Pt ambulatory off unit escorted by [employer]. Pt transported by [employer].

## 2019-04-12 DIAGNOSIS — F1994 Other psychoactive substance use, unspecified with psychoactive substance-induced mood disorder: Secondary | ICD-10-CM

## 2019-04-12 MED ORDER — ADULT MULTIVITAMIN W/MINERALS CH
1.0000 | ORAL_TABLET | Freq: Every day | ORAL | Status: DC
  Administered 2019-04-12 – 2019-04-15 (×4): 1 via ORAL
  Filled 2019-04-12 (×7): qty 1

## 2019-04-12 MED ORDER — LORAZEPAM 1 MG PO TABS: 1.0000 mg | ORAL_TABLET | Freq: Four times a day (QID) | ORAL | Status: AC | PRN

## 2019-04-12 MED ORDER — LOPERAMIDE HCL 2 MG PO CAPS: 2.0000 mg | ORAL_CAPSULE | ORAL | Status: AC | PRN

## 2019-04-12 MED ORDER — HYDROXYZINE HCL 25 MG PO TABS
25.0000 mg | ORAL_TABLET | Freq: Four times a day (QID) | ORAL | Status: AC | PRN
  Administered 2019-04-12 – 2019-04-14 (×3): 25 mg via ORAL
  Filled 2019-04-12 (×3): qty 1

## 2019-04-12 MED ORDER — VITAMIN B-1 100 MG PO TABS
100.0000 mg | ORAL_TABLET | Freq: Every day | ORAL | Status: DC
  Administered 2019-04-13 – 2019-04-15 (×3): 100 mg via ORAL
  Filled 2019-04-12 (×5): qty 1

## 2019-04-12 MED ORDER — ARIPIPRAZOLE 10 MG PO TABS
5.0000 mg | ORAL_TABLET | Freq: Every day | ORAL | Status: DC
  Administered 2019-04-12 – 2019-04-15 (×4): 5 mg via ORAL
  Filled 2019-04-12 (×4): qty 1
  Filled 2019-04-12: qty 7
  Filled 2019-04-12 (×2): qty 1

## 2019-04-12 MED ORDER — ONDANSETRON 4 MG PO TBDP: 4.0000 mg | ORAL_TABLET | Freq: Four times a day (QID) | ORAL | Status: AC | PRN

## 2019-04-12 MED ORDER — FLUOXETINE HCL 20 MG PO CAPS
20.0000 mg | ORAL_CAPSULE | Freq: Every day | ORAL | Status: DC
  Administered 2019-04-12 – 2019-04-15 (×4): 20 mg via ORAL
  Filled 2019-04-12 (×4): qty 1
  Filled 2019-04-12: qty 14
  Filled 2019-04-12 (×2): qty 1

## 2019-04-12 MED ORDER — THIAMINE HCL 100 MG/ML IJ SOLN
100.0000 mg | Freq: Once | INTRAMUSCULAR | Status: DC
  Filled 2019-04-12: qty 2

## 2019-04-12 NOTE — Tx Team (Signed)
Interdisciplinary Treatment and Diagnostic Plan Update  04/12/2019 Time of Session: 9:00am Jordan Walton MRN: [id number]  Principal Diagnosis: <principal problem not specified>  Secondary Diagnoses: Active Problems:   Substance induced mood disorder (HCC)   Current Medications:  Current Facility-Administered Medications  Medication Dose Route Frequency Provider Last Rate Last Dose  . acetaminophen (TYLENOL) tablet 650 mg  650 mg Oral Q6H PRN Ethelene Hal, NP      . alum & mag hydroxide-simeth (MAALOX/MYLANTA) 200-200-20 MG/5ML suspension 30 mL  30 mL Oral Q4H PRN Ethelene Hal, NP      . FLUoxetine (PROZAC) capsule 20 mg  20 mg Oral Daily Cobos, Myer Peer, MD   20 mg at 04/12/19 1245  . hydrOXYzine (ATARAX/VISTARIL) tablet 25 mg  25 mg Oral TID PRN Ethelene Hal, NP   25 mg at 04/12/19 0906  . magnesium hydroxide (MILK OF MAGNESIA) suspension 30 mL  30 mL Oral Daily PRN Ethelene Hal, NP      . QUEtiapine (SEROQUEL) tablet 200 mg  200 mg Oral QHS Ethelene Hal, NP   200 mg at 04/11/19 2334  . traZODone (DESYREL) tablet 50 mg  50 mg Oral QHS PRN Ethelene Hal, NP       PTA Medications: Medications Prior to Admission  Medication Sig Dispense Refill Last Dose  . FLUoxetine (PROZAC) 20 MG tablet Take 20 mg by mouth daily.     . QUEtiapine (SEROQUEL) 200 MG tablet Take 200 mg by mouth at bedtime.       Patient Stressors: Financial difficulties Occupational concerns Substance abuse  Patient Strengths: Ability for insight Active sense of humor Average or above average intelligence  Treatment Modalities: Medication Management, Group therapy, Case management,  1 to 1 session with clinician, Psychoeducation, Recreational therapy.   Physician Treatment Plan for Primary Diagnosis: <principal problem not specified> Long Term Goal(s):     Short Term Goals:    Medication Management: Evaluate patient's response, side effects, and  tolerance of medication regimen.  Therapeutic Interventions: 1 to 1 sessions, Unit Group sessions and Medication administration.  Evaluation of Outcomes: Not Met  Physician Treatment Plan for Secondary Diagnosis: Active Problems:   Substance induced mood disorder ([place])  Long Term Goal(s):     Short Term Goals:       Medication Management: Evaluate patient's response, side effects, and tolerance of medication regimen.  Therapeutic Interventions: 1 to 1 sessions, Unit Group sessions and Medication administration.  Evaluation of Outcomes: Not Met   RN Treatment Plan for Primary Diagnosis: <principal problem not specified> Long Term Goal(s): Knowledge of disease and therapeutic regimen to maintain health will improve  Short Term Goals: Ability to verbalize frustration and anger appropriately will improve, Ability to demonstrate self-control and Ability to identify and develop effective coping behaviors will improve  Medication Management: RN will administer medications as ordered by provider, will assess and evaluate patient's response and provide education to patient for prescribed medication. RN will report any adverse and/or side effects to prescribing provider.  Therapeutic Interventions: 1 on 1 counseling sessions, Psychoeducation, Medication administration, Evaluate responses to treatment, Monitor vital signs and CBGs as ordered, Perform/monitor CIWA, COWS, AIMS and Fall Risk screenings as ordered, Perform wound care treatments as ordered.  Evaluation of Outcomes: Not Met   LCSW Treatment Plan for Primary Diagnosis: <principal problem not specified> Long Term Goal(s): Safe transition to appropriate next level of care at discharge, Engage patient in therapeutic group addressing interpersonal concerns.  Short Term  Goals: Engage patient in aftercare planning with referrals and resources, Increase social support, Identify triggers associated with mental health/substance abuse issues  and Increase skills for wellness and recovery  Therapeutic Interventions: Assess for all discharge needs, 1 to 1 time with Social worker, Explore available resources and support systems, Assess for adequacy in community support network, Educate family and significant other(s) on suicide prevention, Complete Psychosocial Assessment, Interpersonal group therapy.  Evaluation of Outcomes: Not Met   Progress in Treatment: Attending groups: No. Participating in groups: No. Taking medication as prescribed: Yes. Toleration medication: Yes. Family/Significant other contact made: No, will contact:  supports if consents are granted Patient understands diagnosis: Yes. Discussing patient identified problems/goals with staff: Yes. Medical problems stabilized or resolved: Yes. Denies suicidal/homicidal ideation: No. Issues/concerns per patient self-inventory: Yes.  New problem(s) identified: Yes, Describe:  homeless, poor support system  New Short Term/Long Term Goal(s): detox, medication management for mood stabilization; elimination of SI thoughts; development of comprehensive mental wellness/sobriety plan.  Patient Goals:  Find someplace to go  Discharge Plan or Barriers: CSW continuing to assess for appropriate referrals  Reason for Continuation of Hospitalization: Aggression Depression Suicidal ideation  Estimated Length of Stay: 3-5 days  Attendees: Patient: 04/12/2019 10:06 AM  Physician: Dr.Farah 04/12/2019 10:06 AM  Nursing: Chong Sicilian, RN 04/12/2019 10:06 AM  RN Care Manager: 04/12/2019 10:06 AM  Social Worker: Stephanie Acre, [place] 04/12/2019 10:06 AM  Recreational Therapist:  04/12/2019 10:06 AM  Other:  04/12/2019 10:06 AM  Other:  04/12/2019 10:06 AM  Other: 04/12/2019 10:06 AM    Scribe for Treatment Team: Joellen Jersey, [place] 04/12/2019 10:06 AM

## 2019-04-12 NOTE — Progress Notes (Signed)
CSW attempted to meet with patient to complete PSA. Patient states she received some medication and she is very sleepy. She asked CSW to come back at another time.  Stephanie Acre, LCSW-A Clinical Social Worker

## 2019-04-12 NOTE — Progress Notes (Signed)
D Pt is observed OOB UAL on the 300 hall today. This morning, before am meds were given, she is obervved agitated, she is loud and isntrusive , repeatedly presenting to the main nurses station, no mask, yelling " hey m-----------...what the hell are you looking at....you think I'm funny..I'm going to give you some funny to laugh at...". She did agree to take her medicine- along with a 25 mg po prn vistaril- and then she returned to her bed and slept 1 1/2 hrs. WHen she got up, she willingly bathed , her manner was much less loud and she repeatedly thanks this Probation officer for anything Probation officer does for her . She refused to answer her slef invnetory quesitons this moring but , in a very agitated manner, she said " that's stupid.Marland Kitchenibuprofen'm not going to hurt myself IN the hospital".      A She has slept much of this afternoon. Shewikllingly takes her first dose of po abilify and requests her scheudled IM shot of thiamine be rescheduled for tomorrow am.( this was done per patioen's request). This evening, prior to dinner, she is observed by this Probation officer at the main nurses station, [place] and talking to same MHT she cussed out about 89 hours earlier this am.      R Safeyt in place.
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
## 2019-04-12 NOTE — Progress Notes (Signed)
[place] NOVEL CORONAVIRUS (COVID-19) DAILY CHECK-OFF SYMPTOMS - answer yes or no to each - every day NO YES  Have you had a fever in the past 24 hours?  . Fever (Temp > 37.80C / 100F) X   Have you had any of these symptoms in the past 24 hours? . New Cough .  Sore Throat  .  Shortness of Breath .  Difficulty Breathing .  Unexplained Body Aches   X   Have you had any one of these symptoms in the past 24 hours not related to allergies?   . Runny Nose .  Nasal Congestion .  Sneezing   X   If you have had runny nose, nasal congestion, sneezing in the past 24 hours, has it worsened?  X   EXPOSURES - check yes or no X   Have you traveled outside the state in the past 14 days?  X   Have you been in contact with someone with a confirmed diagnosis of COVID-19 or PUI in the past 14 days without wearing appropriate PPE?  X   Have you been living in the same home as a person with confirmed diagnosis of COVID-19 or a PUI (household contact)?    X   Have you been diagnosed with COVID-19?    X              What to do next: Answered NO to all: Answered YES to anything:   Proceed with unit schedule Follow the [facility] Inpatient Flowsheet.   

## 2019-04-13 DIAGNOSIS — F1414 Cocaine abuse with cocaine-induced mood disorder: Principal | ICD-10-CM

## 2019-04-13 DIAGNOSIS — F329 Major depressive disorder, single episode, unspecified: Secondary | ICD-10-CM

## 2019-04-13 LAB — TSH: TSH: 1.337 u[IU]/mL (ref 0.350–4.500)

## 2019-04-13 LAB — HEMOGLOBIN A1C
Hgb A1c MFr Bld: 6.3 % — ABNORMAL HIGH (ref 4.8–5.6)
Mean Plasma Glucose: 134.11 mg/dL

## 2019-04-13 LAB — LIPID PANEL
Cholesterol: 191 mg/dL (ref 0–200)
HDL: 64 mg/dL (ref >40)
LDL Cholesterol: 102 mg/dL — ABNORMAL HIGH (ref 0–99)
Total CHOL/HDL Ratio: 3 RATIO
Triglycerides: 125 mg/dL (ref <150)
VLDL: 25 mg/dL (ref 0–40)

## 2019-04-13 NOTE — Progress Notes (Signed)
[place] NOVEL CORONAVIRUS (COVID-19) DAILY CHECK-OFF SYMPTOMS - answer yes or no to each - every day NO YES  Have you had a fever in the past 24 hours?  . Fever (Temp > 37.80C / 100F) X   Have you had any of these symptoms in the past 24 hours? . New Cough .  Sore Throat  .  Shortness of Breath .  Difficulty Breathing .  Unexplained Body Aches   X   Have you had any one of these symptoms in the past 24 hours not related to allergies?   . Runny Nose .  Nasal Congestion .  Sneezing   X   If you have had runny nose, nasal congestion, sneezing in the past 24 hours, has it worsened?  X   EXPOSURES - check yes or no X   Have you traveled outside the state in the past 14 days?  X   Have you been in contact with someone with a confirmed diagnosis of COVID-19 or PUI in the past 14 days without wearing appropriate PPE?  X   Have you been living in the same home as a person with confirmed diagnosis of COVID-19 or a PUI (household contact)?    X   Have you been diagnosed with COVID-19?    X              What to do next: Answered NO to all: Answered YES to anything:   Proceed with unit schedule Follow the [facility] Inpatient Flowsheet.   

## 2019-04-13 NOTE — [facility] Group Notes (Signed)
[place] Group Notes:  (Nursing)  Date:  04/13/2019  Time: 100 PM Type of Therapy:  Nurse Education  Participation Level:  Active  Participation Quality:  Appropriate  Affect:  Appropriate  Cognitive:  Appropriate  Insight:  Appropriate  Engagement in Group:  Engaged  Modes of Intervention:  Education  Summary of Progress/Problems: Life Skills Group  Jordan Walton 04/13/2019, 2:57 PM

## 2019-04-13 NOTE — Progress Notes (Signed)
D: Patient observed isolative to room all evening. Patient initially pleasant however after having trouble sleeping becomes irritable. Patient's affect labile with congruent mood.   Denies pain, physical complaints. COVID-19 screen negative, afebrile. Respiratory assessment WDL.  A: Medicated per orders, prn trazadone and vistaril given to promote sleep. Medication education provided. Level III obs in place for safety. Emotional support offered. Patient encouraged to complete Suicide Safety Plan before discharge. Encouraged to attend and participate in unit programming.    R: Patient verbalizes understanding of POC. On reassess, patient's trazadone ineffective however patient did fall asleep after vistaril. Patient denies SI/HI/AVH and remains safe on level III obs. Will continue to monitor throughout the night.

## 2019-04-13 NOTE — Progress Notes (Signed)
D Pt is observed OOB UAL on the 300 hall today. Inititally, this am, she was irritable and agitated immediately after eating breakfast. She returned to sleep, slept for 2 more hours then got OOB and took her morning meds, saying " I'll get up in 15 more minutes". ( She did this). She agrees that the abilify she took yesterday made her sleep in the day and agrees to try to stay up and out of her bed today ( to facilitate tonight's sleep pattern). She is dressed in her own clothes, she has just bathed, she is cheerful and loud.      A She did complete her daily assessment and on this she wrote she has experienced SI today, she verbally contracts for safety with this Probation officer and she rates her dperessin, hopelessness and anxeity " 3/3/2", respectively.     R Safety is in place,

## 2019-04-13 NOTE — [facility] Group Notes (Signed)
[facility] Group Notes: (Clinical Social Work)   04/13/2019      Type of Therapy:  Group Therapy   Participation Level:  Did Not Attend - was invited both individually by [facility] and by overhead announcement, chose not to attend.   Kalima Saylor Grossman-Orr, LCSW 04/13/2019, 11:15 AM     

## 2019-04-13 NOTE — Progress Notes (Addendum)
[facility] MD Progress Note  04/13/2019 12:30 PM Jordan Walton  MRN:  [id number] Subjective:  I didn't get any sleep last night. I need my medicines right now. I didn't take them this morning because I was sleepy. Im going to take my medicine and then go lay back down. I been sleep today so I did not do any groups.     Objective: 28 year old female who presents with worsening depression, suicidal ideations, and cocaine abuse. She states her last day of use was 2 days ago. At this time she reports symptoms of irritability, nausea, vomiting, and diarrhea, and tremors. She reports some disturbance with her appetite and her sleep at this time. She is currently taking Abilify and Prozac. She rates her depression 2/10 and anxiety 7/10 with 10 being the worse on Likert scale. She states she did attend groups but none today due to beign sleep. She denies any suicidal or homicidal ideations, or hallucinations.   Principal Problem: <principal problem not specified> Diagnosis: Active Problems:   Substance induced mood disorder (HCC)  Total Time spent with patient: 20 minutes  Past Psychiatric History: history of several prior psychiatric admissions . She was recently briefly admitted at [facility] last month ( " for depression and anxiety") . Denies history of suicide attempts, denies history of self injurious behaviors.  Reports she has been diagnosed with Bipolar Disorder in the past, and describes episodes of depression and briefer episodes of increased energy/irritability/agitation, but describes these are of short duration, usually hours. Does not endorse history of overt manic episodes .  Substance Abuse History in the last 12 months:  History of cocaine use disorder, has been using daily. She also reports daily alcohol consumption - drinking up to 3  40 ounce beers per day. 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 Admission BAL 10, admission UDS positive for Cocaine and for Amphetamines   Consequences of Substance Abuse: Denies history of seizures , history of blackouts . Previous Psychotropic Medications: Prozac 20 mgrs QDAY, Seroquel 200 mgrs QHS- states they were started about a week ago at [facility]. Denies side effects. States that before then she had been off psychiatric medications for close to a year  Past Medical History:  Past Medical History:  Diagnosis Date  . Crack cocaine use    History reviewed. No pertinent surgical history. Family History: History reviewed. No pertinent family history. Family Psychiatric  History:reports history of depression and anxiety in extended family. No suicides in family. Father had alcohol abuse  Tobacco Screening: smokes 1/2 PPD  Social History:  Social History   Substance and Sexual Activity  Alcohol Use Yes  . Alcohol/week: 6.0 - 12.0 standard drinks  . Types: 6 - 12 Cans of beer per week     Social History   Substance and Sexual Activity  Drug Use Yes  . Frequency: 7.0 times per week  . Types: "Crack" cocaine   Comment: Daily use -- up to a gram    Social History   Socioeconomic History  . Marital status: Single    Spouse name: Not on file  . Number of children: Not on file  . Years of education: Not on file  . Highest education level: Not on file  Occupational History  . Occupation: Unemployed  Social Needs  . Financial resource strain: Not on file  . Food insecurity    Worry: Not on file    Inability: Not on file  . Transportation needs    Medical:  Not on file    Non-medical: Not on file  Tobacco Use  . Smoking status: Current Every Day Smoker    Types: Cigarettes  . Smokeless tobacco: Never Used  Substance and Sexual Activity  . Alcohol use: Yes    Alcohol/week: 6.0 - 12.0 standard drinks    Types: 6 - 12 Cans of beer per week  . Drug use: Yes    Frequency: 7.0 times per week    Types: "Crack" cocaine    Comment: Daily use -- up to a gram  . Sexual activity: Yes  Lifestyle  . Physical activity     Days per week: Not on file    Minutes per session: Not on file  . Stress: Not on file  Relationships  . Social Musicianconnections    Talks on phone: Not on file    Gets together: Not on file    Attends religious service: Not on file    Active member of club or organization: Not on file    Attends meetings of clubs or organizations: Not on file    Relationship status: Not on file  Other Topics Concern  . Not on file  Social History Narrative   Client lives in [place]; unemployed; not actively seeking under care of psychiatrist, recently stopped attending [facility].   Additional Social History:    Sleep: Poor  Appetite:  Fair  Current Medications: Current Facility-Administered Medications  Medication Dose Route Frequency Provider Last Rate Last Dose  . acetaminophen (TYLENOL) tablet 650 mg  650 mg Oral Q6H PRN Laveda AbbeParks, Laurie Britton, NP      . alum & mag hydroxide-simeth (MAALOX/MYLANTA) 200-200-20 MG/5ML suspension 30 mL  30 mL Oral Q4H PRN Laveda AbbeParks, Laurie Britton, NP      . ARIPiprazole (ABILIFY) tablet 5 mg  5 mg Oral Daily Canaan Holzer, Rockey SituFernando A, MD   5 mg at 04/13/19 0938  . FLUoxetine (PROZAC) capsule 20 mg  20 mg Oral Daily Shawnita Krizek, Rockey SituFernando A, MD   20 mg at 04/13/19 0939  . hydrOXYzine (ATARAX/VISTARIL) tablet 25 mg  25 mg Oral Q6H PRN Aviel Davalos, Rockey SituFernando A, MD   25 mg at 04/12/19 2251  . loperamide (IMODIUM) capsule 2-4 mg  2-4 mg Oral PRN Yui Mulvaney, Rockey SituFernando A, MD      . LORazepam (ATIVAN) tablet 1 mg  1 mg Oral Q6H PRN Courage Biglow A, MD      . magnesium hydroxide (MILK OF MAGNESIA) suspension 30 mL  30 mL Oral Daily PRN Laveda AbbeParks, Laurie Britton, NP      . multivitamin with minerals tablet 1 tablet  1 tablet Oral Daily Zacheriah Stumpe, Rockey SituFernando A, MD   1 tablet at 04/13/19 0939  . ondansetron (ZOFRAN-ODT) disintegrating tablet 4 mg  4 mg Oral Q6H PRN Khyre Germond, Rockey SituFernando A, MD      . thiamine (B-1) injection 100 mg  100 mg Intramuscular Once Ting Cage A, MD      . thiamine (VITAMIN B-1) tablet 100 mg   100 mg Oral Daily Deshawna Mcneece, Rockey SituFernando A, MD   100 mg at 04/13/19 0938  . traZODone (DESYREL) tablet 50 mg  50 mg Oral QHS PRN Laveda AbbeParks, Laurie Britton, NP   50 mg at 04/12/19 2117    Lab Results:  Results for orders placed or performed during the hospital encounter of 04/11/19 (from the past 48 hour(s))  Lipid panel     Status: Abnormal   Collection Time: 04/13/19  7:36 AM  Result Value Ref Range   Cholesterol 191  0 - 200 mg/dL   Triglycerides 161125 <096<150 mg/dL   HDL 64 >04>40 mg/dL   Total CHOL/HDL Ratio 3.0 RATIO   VLDL 25 0 - 40 mg/dL   LDL Cholesterol 540102 (H) 0 - 99 mg/dL    Comment:        Total Cholesterol/HDL:CHD Risk Coronary Heart Disease Risk Table                     Men   Women  1/2 Average Risk   3.4   3.3  Average Risk       5.0   4.4  2 X Average Risk   9.6   7.1  3 X Average Risk  23.4   11.0        Use the calculated Patient Ratio above and the CHD Risk Table to determine the patient's CHD Risk.        ATP III CLASSIFICATION (LDL):  <100     mg/dL   Optimal  [id number]  mg/dL   Near or Above                    Optimal  130-159  mg/dL   Borderline  [id number]  mg/dL   High  >621>190     mg/dL   Very High Performed at [facility], [address] [place], [place] [address]   Hemoglobin A1c     Status: Abnormal   Collection Time: 04/13/19  7:36 AM  Result Value Ref Range   Hgb A1c MFr Bld 6.3 (H) 4.8 - 5.6 %    Comment: (NOTE) Pre diabetes:          5.7%-6.4% Diabetes:              >6.4% Glycemic control for   <7.0% adults with diabetes    Mean Plasma Glucose 134.11 mg/dL    Comment: Performed at [facility], [address] [place], [place] [address]  TSH     Status: None   Collection Time: 04/13/19  7:36 AM  Result Value Ref Range   TSH 1.337 0.350 - 4.500 uIU/mL    Comment: Performed by a 3rd Generation assay with a functional sensitivity of <=0.01 uIU/mL. Performed at [facility], [address] [place], [place]  [address]     Blood Alcohol level:  Lab Results  Component Value Date   ETH 10 (H) 04/10/2019   ETH <10 03/30/2019    Metabolic Disorder Labs: Lab Results  Component Value Date   HGBA1C 6.3 (H) 04/13/2019   MPG 134.11 04/13/2019   No results found for: PROLACTIN Lab Results  Component Value Date   CHOL 191 04/13/2019   TRIG 125 04/13/2019   HDL 64 04/13/2019   CHOLHDL 3.0 04/13/2019   VLDL 25 04/13/2019   LDLCALC 102 (H) 04/13/2019    Physical Findings: AIMS:  , ,  ,  ,    CIWA:  CIWA-Ar Total: 0 COWS:     Musculoskeletal: Strength & Muscle Tone: within normal limits Gait & Station: normal Patient leans: N/A  Psychiatric Specialty Exam: Physical Exam  Nursing note and vitals reviewed. Constitutional: She is oriented to person, place, and time. She appears well-developed and well-nourished.  HENT:  Head: Normocephalic.  Eyes: Pupils are equal, round, and reactive to light.  Musculoskeletal: Normal range of motion.  Neurological: She is alert and oriented to person, place, and time.  Skin: Skin is warm and dry.  Psychiatric: She has a normal mood and affect.    Review of Systems  All other systems reviewed and are negative.   Blood pressure 121/89, pulse 82, temperature 98.2 F (36.8 C), temperature source Oral, resp. rate 18, height 5\' 2"  (1.575 m), weight 96.2 kg, last menstrual period 03/18/2019, SpO2 100 %.Body mass index is 38.78 kg/m.  General Appearance: Fairly Groomed  Eye Contact:  Fair  Speech:  Clear and Coherent and Normal Rate  Volume:  Normal  Mood:  Anxious and Depressed  Affect:  Depressed and Labile  Thought Process:  Coherent, Linear and Descriptions of Associations: Intact  Orientation:  Full (Time, Place, and Person)  Thought Content:  Logical  Suicidal Thoughts:  No  Homicidal Thoughts:  No  Memory:  Immediate;   Fair Recent;   Fair  Judgement:  Impaired  Insight:  Shallow  Psychomotor Activity:  Normal  Concentration:   Concentration: Poor and Attention Span: Poor  Recall:  Fis[employer]Fair  Fund of Knowledge:  Fair  Language:  Poor  Akathisia:  No  Handed:  Right  AIMS (if indicated):     Assets:  Communication Skills Desire for Improvement Financial Resources/Insurance Housing Physical Health Transportation Vocational/Educational  ADL's:  Intact  Cognition:  WNL  Sleep:  Number of Hours: 5.5     Treatment Plan Summary: Daily contact with patient to assess and evaluate symptoms and progress in treatment and Medication management  We discussed options- agrees to Abilify, which she states she took years ago without side effects. Continue Prozac 20 mgrs QDAY Start Abilify 5 mgrs QDAY  Continue Trazodone 50 mgrs QHS PRN for insomnia  She is not currently presenting with symptoms of alcohol WDL- start Ativan detox protocol ( PRN) for alcohol WDL as needed    Maryagnes Amosakia S Starkes-Perry, FNP 04/13/2019, 12:30 PM   Attest to NP Progress Note
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
## 2019-04-14 MED ORDER — TRAZODONE HCL 100 MG PO TABS
100.0000 mg | ORAL_TABLET | Freq: Every evening | ORAL | Status: DC | PRN
  Administered 2019-04-14: 21:00:00 100 mg via ORAL
  Filled 2019-04-14: qty 1
  Filled 2019-04-14: qty 14

## 2019-04-14 NOTE — [facility] Suicide Risk Assessment (Addendum)
[facility] INPATIENT:  Family/Significant Other Suicide Prevention Education  Suicide Prevention Education:  Education Completed; father, Jordan Walton [phone],  (name of family member/significant other) has been identified by the patient as the family member/significant other with whom the patient will be residing, and identified as the person(s) who will aid the patient in the event of a mental health crisis (suicidal ideations/suicide attempt).  With written consent from the patient, the family member/significant other has been provided the following suicide prevention education, prior to the and/or following the discharge of the patient.  The suicide prevention education provided includes the following:  Suicide risk factors  Suicide prevention and interventions  National Suicide Hotline telephone number  [facility] assessment telephone number  [facility] Emergency Assistance [place] and/or Residential Mobile Crisis Unit telephone number  Request made of family/significant other to:  Remove weapons (e.g., guns, rifles, knives), all items previously/currently identified as safety concern.    Remove drugs/medications (over-the-counter, prescriptions, illicit drugs), all items previously/currently identified as a safety concern.  The family member/significant other verbalizes understanding of the suicide prevention education information provided.  The family member/significant other agrees to remove the items of safety concern listed above.  Father reports that he has concerns because she has no where to go at discharge and she needs to go directly to treatment from here. He reports she needs a long term treatment program and 8 months clearly was not enough for her. He reports she lost her home and her daughter that is 73 years old and has not seen her in two years. He reports patient has a lot of hurt because her mom was not in her life a lot. He shared the  stimulus check stimulated the wrong things and she learned that she cannot handle money right now.   Jordan Walton 04/14/2019, 6:02 PM

## 2019-04-14 NOTE — Progress Notes (Signed)
D. Pt remained in bed for much of the morning. Per pt's self inventory, pt rated her depression, hopelessness and anxiety a 03/08/09, respectively. Pt visible in the afternoon interacting appropriately with peers and staff. Pt currently denies SI/HI and AVH A. Labs and vitals monitored. Pt compliant with medications. Pt supported emotionally and encouraged to express concerns and ask questions.   R. Pt remains safe with 15 minute checks. Will continue POC.

## 2019-04-14 NOTE — Progress Notes (Signed)
D: Patient observed isolative to room however is smiling on approach. Patient states she's had a good day and requests prns for sleep. Patient's affect animated, mood anxious, pleasant. Denies pain, physical complaints, withdrawal symptoms. COVID-19 screen negative, afebrile. Respiratory assessment WDL.  A: Medicated per orders, prn trazadone and vistaril given for anxiety, sleep. Medication education provided. Level III obs in place for safety. Emotional support offered. Patient encouraged to complete Suicide Safety Plan before discharge. Encouraged to attend and participate in unit programming.   R: Patient verbalizes understanding of POC. On reassess, patient is asleep. Patient denies SI/HI/AVH and remains safe on level III obs. Will continue to monitor throughout the night.

## 2019-04-14 NOTE — [facility] Group Notes (Signed)
[facility] Group Notes: (Clinical Social Work)   04/14/2019      Type of Therapy:  Group Therapy   Participation Level:  Did Not Attend - was invited both individually by [facility] and by overhead announcement, chose not to attend.   Selmer Dominion, LCSW 04/14/2019, 11:30 AM

## 2019-04-14 NOTE — Progress Notes (Signed)
[facility] NOVEL CORONAVIRUS (COVID-19) DAILY CHECK-OFF SYMPTOMS - answer yes or no to each - every day NO YES  Have you had a fever in the past 24 hours?  . Fever (Temp > 37.80C / 100F) X   Have you had any of these symptoms in the past 24 hours? . New Cough .  Sore Throat  .  Shortness of Breath .  Difficulty Breathing .  Unexplained Body Aches   X   Have you had any one of these symptoms in the past 24 hours not related to allergies?   . Runny Nose .  Nasal Congestion .  Sneezing   X   If you have had runny nose, nasal congestion, sneezing in the past 24 hours, has it worsened?  X   EXPOSURES - check yes or no X   Have you traveled outside the state in the past 14 days?  X   Have you been in contact with someone with a confirmed diagnosis of COVID-19 or PUI in the past 14 days without wearing appropriate PPE?  X   Have you been living in the same home as a person with confirmed diagnosis of COVID-19 or a PUI (household contact)?    X   Have you been diagnosed with COVID-19?    X              What to do next: Answered NO to all: Answered YES to anything:   Proceed with unit schedule Follow the [facility] Inpatient Flowsheet.   

## 2019-04-14 NOTE — Progress Notes (Addendum)
[facility]  04/14/2019 2:22 PM Kade Wannetta SenderRenee Pethtel  MRN:  [id number]  Subjective: Jordan Walton reported " I am feeling a little better than I did on yesterday."  Evaluation: Anayla observed resting in bed.  She is awake, alert and oriented x3.  Reporting concerns related to sleep.  As she is requesting for her trazodone to be increased.  Patient was encouraged to participate within the milieu and sleep at night as she stated taking a nap throughout the day.  Continues to express passive suicidal ideations.  However denies plan or intent.  Reports feeling "worthless and guilty" as she states she has to apologize to her father due to her recent relapse.  Koda reports she was "7 months clean."  She reports a good appetite.  States she is resting well throughout the night.  Denies cravings as this relates to cocaine.  Rates her depression 8 out of 10 with 10 being the worst.  Will titrate trazodone.  Support, encouragement and reassurance was provided.    Principal Problem: Substance induced mood disorder (HCC) Diagnosis: Principal Problem:   Substance induced mood disorder (HCC)  Total Time spent with patient: 15 minutes  Past Psychiatric History: Per assessment note history: of several prior psychiatric admissions . She was [facility] ( " for depression and anxiety") . Denies history of suicide attempts, denies history of self injurious behaviors.  .  Substance Abuse History in the last 12 months:  History of cocaine use disorder, has been using daily. She also reports daily alcohol consumption - drinking up to 3  40 ounce beers per day. 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 Admission BAL 10, admission UDS positive for Cocaine and for Amphetamines   Consequences of Substance Abuse: N/A   . Previous Psychotropic Medications: Per assessment note Prozac 20 mgrs QDAY, Seroquel 200 mgrs QHS- states they were started about a week ago  at [facility]. Denies side effects. States that before then she had been off psychiatric medications for close to a year   Past Medical History:  Past Medical History:  Diagnosis Date  . Crack cocaine use    History reviewed. No pertinent surgical history. Family History: History reviewed. No pertinent family history. Family Psychiatric  History:reports history of depression and anxiety in extended family. No suicides in family. Father had alcohol abuse  Tobacco Screening: smokes 1/2 PPD  Social History:  Social History   Substance and Sexual Activity  Alcohol Use Yes  . Alcohol/week: 6.0 - 12.0 standard drinks  . Types: 6 - 12 Cans of beer per week     Social History   Substance and Sexual Activity  Drug Use Yes  . Frequency: 7.0 times per week  . Types: "Crack" cocaine   Comment: Daily use -- up to a gram    Social History   Socioeconomic History  . Marital status: Single    Spouse name: Not on file  . Number of children: Not on file  . Years of education: Not on file  . Highest education level: Not on file  Occupational History  . Occupation: Unemployed  Social Needs  . Financial resource strain: Not on file  . Food insecurity    Worry: Not on file    Inability: Not on file  . Transportation needs    Medical: Not on file    Non-medical: Not on file  Tobacco Use  . Smoking status: Current Every Day Smoker    Types:  Cigarettes  . Smokeless tobacco: Never Used  Substance and Sexual Activity  . Alcohol use: Yes    Alcohol/week: 6.0 - 12.0 standard drinks    Types: 6 - 12 Cans of beer per week  . Drug use: Yes    Frequency: 7.0 times per week    Types: "Crack" cocaine    Comment: Daily use -- up to a gram  . Sexual activity: Yes  Lifestyle  . Physical activity    Days per week: Not on file    Minutes per session: Not on file  . Stress: Not on file  Relationships  . Social Musicianconnections    Talks on phone: Not on file    Gets together: Not on file    Attends  religious service: Not on file    Active member of club or organization: Not on file    Attends meetings of clubs or organizations: Not on file    Relationship status: Not on file  Other Topics Concern  . Not on file  Social History Narrative   Client lives in [place]; unemployed; not actively seeking under care of psychiatrist, recently stopped attending [facility].   Additional Social History:    Sleep: Poor  Appetite:  Fair  Current Medications: Current Facility-Administered Medications  Medication Dose Route Frequency Provider Last Rate Last Dose  . acetaminophen (TYLENOL) tablet 650 mg  650 mg Oral Q6H PRN Laveda AbbeParks, Laurie Britton, NP      . alum & mag hydroxide-simeth (MAALOX/MYLANTA) 200-200-20 MG/5ML suspension 30 mL  30 mL Oral Q4H PRN Laveda AbbeParks, Laurie Britton, NP      . ARIPiprazole (ABILIFY) tablet 5 mg  5 mg Oral Daily , Rockey Situ A, MD   5 mg at 04/14/19 0943  . FLUoxetine (PROZAC) capsule 20 mg  20 mg Oral Daily , Rockey Situ A, MD   20 mg at 04/14/19 0942  . hydrOXYzine (ATARAX/VISTARIL) tablet 25 mg  25 mg Oral Q6H PRN , Rockey Situ A, MD   25 mg at 04/13/19 2155  . loperamide (IMODIUM) capsule 2-4 mg  2-4 mg Oral PRN , Rockey Situ A, MD      . LORazepam (ATIVAN) tablet 1 mg  1 mg Oral Q6H PRN ,  A, MD      . magnesium hydroxide (MILK OF MAGNESIA) suspension 30 mL  30 mL Oral Daily PRN Laveda AbbeParks, Laurie Britton, NP      . multivitamin with minerals tablet 1 tablet  1 tablet Oral Daily , Rockey Situ A, MD   1 tablet at 04/14/19 0942  . ondansetron (ZOFRAN-ODT) disintegrating tablet 4 mg  4 mg Oral Q6H PRN , Rockey Situ A, MD      . thiamine (B-1) injection 100 mg  100 mg Intramuscular Once ,  A, MD      . thiamine (VITAMIN B-1) tablet 100 mg  100 mg Oral Daily , Rockey Situ A, MD   100 mg at 04/14/19 0942  . traZODone (DESYREL) tablet 100 mg  100 mg Oral QHS PRN Oneta RackLewis, Tanika N, NP        Lab Results:  Results for orders placed or  performed during the hospital encounter of 04/11/19 (from the past 48 hour(s))  Lipid panel     Status: Abnormal   Collection Time: 04/13/19  7:36 AM  Result Value Ref Range   Cholesterol 191 0 - 200 mg/dL   Triglycerides 161125 <096<150 mg/dL   HDL 64 >04>40 mg/dL   Total CHOL/HDL Ratio 3.0 RATIO   VLDL 25 0 -  40 mg/dL   LDL Cholesterol 604102 (H) 0 - 99 mg/dL    Comment:        Total Cholesterol/HDL:CHD Risk Coronary Heart Disease Risk Table                     Men   Women  1/2 Average Risk   3.4   3.3  Average Risk       5.0   4.4  2 X Average Risk   9.6   7.1  3 X Average Risk  23.4   11.0        Use the calculated Patient Ratio above and the CHD Risk Table to determine the patient's CHD Risk.        ATP III CLASSIFICATION (LDL):  <100     mg/dL   Optimal  540-981100-129  mg/dL   Near or Above                    Optimal  130-159  mg/dL   Borderline  191-478160-189  mg/dL   High  >295>190     mg/dL   Very High Performed at [facility], [address] [place], [place] [address]   Hemoglobin A1c     Status: Abnormal   Collection Time: 04/13/19  7:36 AM  Result Value Ref Range   Hgb A1c MFr Bld 6.3 (H) 4.8 - 5.6 %    Comment: (NOTE) Pre diabetes:          5.7%-6.4% Diabetes:              >6.4% Glycemic control for   <7.0% adults with diabetes    Mean Plasma Glucose 134.11 mg/dL    Comment: Performed at [facility], [address] [place], [place] [address]  TSH     Status: None   Collection Time: 04/13/19  7:36 AM  Result Value Ref Range   TSH 1.337 0.350 - 4.500 uIU/mL    Comment: Performed by a 3rd Generation assay with a functional sensitivity of <=0.01 uIU/mL. Performed at [facility], [address] [place], [place] [address]     Blood Alcohol level:  Lab Results  Component Value Date   ETH 10 (H) 04/10/2019   ETH <10 03/30/2019    Metabolic Disorder Labs: Lab Results  Component Value Date   HGBA1C 6.3 (H) 04/13/2019   MPG 134.11  04/13/2019   No results found for: PROLACTIN Lab Results  Component Value Date   CHOL 191 04/13/2019   TRIG 125 04/13/2019   HDL 64 04/13/2019   CHOLHDL 3.0 04/13/2019   VLDL 25 04/13/2019   LDLCALC 102 (H) 04/13/2019    Physical Findings: AIMS:  , ,  ,  ,    CIWA:  CIWA-Ar Total: 1 COWS:     Musculoskeletal: Strength & Muscle Tone: within normal limits Gait & Station: normal Patient leans: N/A  Psychiatric Specialty Exam: Physical Exam  Nursing note and vitals reviewed. Constitutional: She is oriented to person, place, and time. She appears well-developed and well-nourished.  HENT:  Head: Normocephalic.  Eyes: Pupils are equal, round, and reactive to light.  Musculoskeletal: Normal range of motion.  Neurological: She is alert and oriented to person, place, and time.  Skin: Skin is warm and dry.  Psychiatric: She has a normal mood and affect.    Review of Systems  All other systems reviewed and are negative.   Blood pressure 128/78, pulse  89, temperature 99.9 F (37.7 C), temperature source Oral, resp. rate 20, height 5\' 2"  (1.575 m), weight 96.2 kg, last menstrual period 03/18/2019, SpO2 98 %.Body mass index is 38.78 kg/m.  General Appearance: Fairly Groomed  Eye Contact:  Fair  Speech:  Clear and Coherent and Normal Rate  Volume:  Normal  Mood:  Anxious and Depressed  Affect:  Depressed and Labile  Thought Process:  Coherent, Linear and Descriptions of Associations: Intact  Orientation:  Full (Time, Place, and Person)  Thought Content:  Logical  Suicidal Thoughts:  No  Homicidal Thoughts:  No  Memory:  Immediate;   Fair Recent;   Fair  Judgement:  Impaired  Insight:  Shallow  Psychomotor Activity:  Normal  Concentration:  Concentration: Poor and Attention Span: Poor  Recall:  [employer] of Knowledge:  Fair  Language:  Poor  Akathisia:  No  Handed:  Right  AIMS (if indicated):     Assets:  Desire for Improvement Financial  Resources/Insurance Resilience Social Support Transportation Vocational/Educational  ADL's:  Intact  Cognition:  WNL  Sleep:  Number of Hours: 6.5     Treatment Plan Summary: Daily contact with patient to assess and evaluate symptoms and progress in treatment and Medication management   Continue her current treatment plan on 04/14/2019 as listed below except were noted  Substance-induced mood disorder Depression  Continue Prozac 20 mgrs QDAY Continue Abilify 5 mgrs QDAY  Increased trazodone 50 mgs to 100 mg  QHS PRN for insomnia  Continue CIWA protocol with Ativan as needed  CSW to continue working on discharge disposition Patient encouraged to participate within therapeutic milieu    Derrill Center, NP 04/14/2019, 2:22 PM   Attest to NP Progress Note

## 2019-04-15 MED ORDER — FLUOXETINE HCL 20 MG PO CAPS: 20.0000 mg | ORAL_CAPSULE | Freq: Every day | ORAL | 1 refills | Status: DC

## 2019-04-15 MED ORDER — ENBRACE HR PO CAPS: 1.0000 | ORAL_CAPSULE | Freq: Every day | ORAL | 1 refills | Status: DC

## 2019-04-15 MED ORDER — ARIPIPRAZOLE 5 MG PO TABS: 5.0000 mg | ORAL_TABLET | Freq: Every day | ORAL | 1 refills | Status: DC

## 2019-04-15 MED ORDER — TRAZODONE HCL 100 MG PO TABS: 100.0000 mg | ORAL_TABLET | Freq: Every evening | ORAL | 1 refills | Status: DC | PRN

## 2019-04-15 NOTE — Progress Notes (Signed)
Spiritual care group on grief and loss facilitated by chaplain Estelle Skibicki  Group Goal:  Support / Education around grief and loss Members engage in facilitated group support and psycho-social education.  Group Description:  Following introductions and group rules, group members engaged in facilitated group dialog and support around topic of loss, with particular support around experiences of loss in their lives. Group Identified types of loss (relationships / self / things) and identified patterns, circumstances, and changes that precipitate losses. Reflected on thoughts / feelings around loss, normalized grief responses, and recognized variety in grief experience. Patient Progress: Pt did not attend group.  

## 2019-04-15 NOTE — Progress Notes (Signed)
  [facility] Adult Case Management Discharge Plan :  Will you be returning to the same living situation after discharge:  No. Going to stay at [facility] At discharge, do you have transportation home?: Yes,  father is arranging transportation. Do you have the ability to pay for your medications: No. Provided samples and will follow up with [facility].  Release of information consent forms completed and in the chart.  Patient to Follow up at: Follow-up Information    [facility] Follow up on 04/15/2019.   Why: Patient will discharge to shelter on Monday, 7/13.  Contact information: [address], [place] [address] Ph: [phone] Fx: [phone]       [facility] Follow up on 04/17/2019.   Why: Telephonic intake hospital follow up appointment with Misty is Wednesday, 7/15 at 10:00a.  Misty will contact you.  Medication management appointment on Thursday, 7/16 at 4:00p. Contact information: [employer] [address] [place]. Call.   Why: Please call to follow up with your residential treatment referral. Contact information: [place] [address] [phone]           Next level of care provider has access to [facility] and Suicide Prevention discussed: Yes,  with father.  Have you used any form of tobacco in the last 30 days? (Cigarettes, Smokeless Tobacco, Cigars, and/or Pipes): Yes  Has patient been referred to the Quitline?: Patient refused referral  Patient has been referred for addiction treatment: Yes  Joellen Jersey, Portal 04/15/2019, 12:08 PM

## 2019-04-15 NOTE — Progress Notes (Signed)
D: Pt denies SI/HI/AV hallucinations. Pt is pleasant and cooperative. Pt has been out in milieu tonight interacting with peers. Patient goal for the day was to call and talk with her dad. A: Pt was offered support and encouragement. Pt was given scheduled medications. Pt was encourage to attend groups. Q 15 minute checks were done for safety.  R:Pt attends groups and interacts well with peers and staff. Pt is taking medication. Pt has no complaints.Pt receptive to treatment and safety maintained on unit.

## 2019-04-15 NOTE — [facility] Suicide Risk Assessment (Signed)
[facility] Discharge Suicide Risk Assessment   Principal Problem: Substance induced mood disorder ([place]) Discharge Diagnoses: Principal Problem:   Substance induced mood disorder ([place])   Total Time spent with patient: 45 minutes  Musculoskeletal: Strength & Muscle Tone: within normal limits Gait & Station: normal Patient leans: N/A  Psychiatric Specialty Exam: ROS  Blood pressure 114/81, pulse 95, temperature 99 F (37.2 C), temperature source Oral, resp. rate 18, height 5\' 2"  (1.575 m), weight 96.2 kg, last menstrual period 03/18/2019, SpO2 98 %.Body mass index is 38.78 kg/m.  General Appearance: Casual  Eye Contact::  Good  Speech:  Clear and Coherent409  Volume:  Normal  Mood:  Euthymic  Affect:  Congruent  Thought Process:  Coherent and Descriptions of Associations: Intact  Orientation:  Full (Time, Place, and Person)  Thought Content:  Logical  Suicidal Thoughts:  No  Homicidal Thoughts:  No  Memory:  Immediate;   Good  Judgement:  Good  Insight:  Good  Psychomotor Activity:  Normal  Concentration:  Good  Recall:  Good  Fund of Knowledge:Good  Language: Good  Akathisia:  Negative  Handed:  Right  AIMS (if indicated):     Assets:  Communication Skills Desire for Improvement Financial Resources/Insurance  Sleep:  Number of Hours: 6.75  Cognition: WNL  ADL's:  Intact   Mental Status Per Nursing Assessment::   On Admission:  Suicidal ideation indicated by patient  Demographic Factors:  NA  Loss Factors: NA  Historical Factors: NA  Risk Reduction Factors:   Sense of responsibility to family and Religious beliefs about death  Continued Clinical Symptoms:  Alcohol/Substance Abuse/Dependencies  Cognitive Features That Contribute To Risk:  None    Suicide Risk:  Minimal: No identifiable suicidal ideation.  Patients presenting with no risk factors but with morbid ruminations; may be classified as minimal risk based on the severity of the depressive  symptoms  Follow-up Information    [facility] Follow up on 04/15/2019.   Why: Patient will discharge to shelter on Monday, 7/13.  Contact information: [address], [place] [address] Ph: [phone] Fx: [phone]       [facility] Follow up.   Contact information: [place] [address] [phone]           Plan Of Care/Follow-up recommendations:  Activity:  full  Dreshawn Hendershott, MD 04/15/2019, 11:01 AM

## 2019-04-15 NOTE — Plan of Care (Signed)
  Problem: Activity: Goal: Sleeping patterns will improve Outcome: Progressing   Problem: Safety: Goal: Periods of time without injury will increase Outcome: Progressing   

## 2019-04-15 NOTE — Progress Notes (Signed)
Patient has been accepted into a women's shelter and [facility] work program. Her father is working to secure transportation, as she will need to arrive prior to 4:00pm today.  Patient will need medication samples and a copy of her negative COVID screening. She is agreeable to follow up at [facility] in [facility].  Stephanie Acre, LCSW-A Clinical Social Worker

## 2019-04-15 NOTE — Progress Notes (Signed)
Recreation Therapy Notes  Date:  7.13.20 Time: 0930 Location: [address] Dayroom  Group Topic: Stress Management  Goal Area(s) Addresses:  Patient will identify positive stress management techniques. Patient will identify benefits of using stress management post d/c.  Intervention:  Stress Management  Activity :  Meditation.  LRT played a meditation that focused on making the most of your day.  Patients were to listen and follow along as the meditation played to engage in activity.  Education:  Stress Management, Discharge Planning.   Education Outcome: Acknowledges Education  Clinical Observations/Feedback:  Pt did not attend group.    Victorino Sparrow, LRT/CTRS      Ria Comment, Julio Storr A 04/15/2019 11:18 AM

## 2019-04-15 NOTE — Progress Notes (Signed)
CSW discussed discharge with patient at bedside. Patient states she is not ready to discharge because she has no where to go. Patient stated she would call her father, who lives in [place].  CSW received a call from father, Jordan Walton [phone]. Father expressed the same concerns and stated he would not allow the patient to come to his home, he wants her to go to treatment again (patient left [facility] after 8 months). CSW explained it is unlikely that the patient will be unable to discharge directly into a treatment program due to waitlists and COVID 19.  Father called Pension scheme manager, a women's [facility] in [place]. He arranged for the patient to have a phone interview today. CSW transferred the call to the patient and she is completing her phone interview at this time. There is a current bed available at the home.  Stephanie Acre, LCSW-A Clinical Social Worker

## 2019-04-15 NOTE — Discharge Summary (Signed)
Physician Discharge Summary Note  Patient:  Jordan Walton is an 28 y.o., female MRN:  [id number] DOB:  10-31-1990 Patient phone:  [phone] (home)  Patient address:   [place] [address],  Total Time spent with patient: 45 minutes  Date of Admission:  04/11/2019 Date of Discharge: 04/15/2019  Reason for Admission:   History of Present Illness: Patient is a 28 year old female, who initially presented to [facility] reporting depression, suicidal ideations, substance abuse . Reports that during her evaluation at [facility] she was vomiting , so was sent to ED for clearance.  She reports she has been experiencing worsening depression over the last month. She describes neuro-vegetative symptoms as below and also states she has occasional/intermittent auditory hallucinations , although not over the last 1-2 days. She also reports recent suicidal ideations with thoughts of hanging self .   She has a history of cocaine and alcohol use disorder- reports she has been using crack cocaine daily, methamphetamine irregularly, and has also been drinking up to 120 ounces of beer on most days . Admission BAL 10, admission UDS positive for Cocaine and Amphetamines .  Principal Problem: Substance induced mood disorder [facility]) Discharge Diagnoses: Principal Problem:   Substance induced mood disorder ([place])   Past Psychiatric History: as per hpi  Past Medical History:  Past Medical History:  Diagnosis Date  . Crack cocaine use    History reviewed. No pertinent surgical history. Family History: History reviewed. No pertinent family history. Family Psychiatric  History: as per hpi Social History:  Social History   Substance and Sexual Activity  Alcohol Use Yes  . Alcohol/week: 6.0 - 12.0 standard drinks  . Types: 6 - 12 Cans of beer per week     Social History   Substance and Sexual Activity  Drug Use Yes  . Frequency: 7.0 times per week  . Types: "Crack" cocaine   Comment: Daily use --  up to a gram    Social History   Socioeconomic History  . Marital status: Single    Spouse name: Not on file  . Number of children: Not on file  . Years of education: Not on file  . Highest education level: Not on file  Occupational History  . Occupation: Unemployed  Social Needs  . Financial resource strain: Not on file  . Food insecurity    Worry: Not on file    Inability: Not on file  . Transportation needs    Medical: Not on file    Non-medical: Not on file  Tobacco Use  . Smoking status: Current Every Day Smoker    Types: Cigarettes  . Smokeless tobacco: Never Used  Substance and Sexual Activity  . Alcohol use: Yes    Alcohol/week: 6.0 - 12.0 standard drinks    Types: 6 - 12 Cans of beer per week  . Drug use: Yes    Frequency: 7.0 times per week    Types: "Crack" cocaine    Comment: Daily use -- up to a gram  . Sexual activity: Yes  Lifestyle  . Physical activity    Days per week: Not on file    Minutes per session: Not on file  . Stress: Not on file  Relationships  . Social Herbalist on phone: Not on file    Gets together: Not on file    Attends religious service: Not on file    Active member of club or organization: Not on file  Attends meetings of clubs or organizations: Not on file    Relationship status: Not on file  Other Topics Concern  . Not on file  Social History Narrative   [place]; unemployed; not actively seeking under care of psychiatrist, recently stopped attending [facility].    Hospital Course:    Patient was admitted under routine precautions fluoxetine was continued and augmented with aripiprazole.  She displayed no dangerous behaviors here.  No withdrawal symptoms were significant.  She participated fully in groups and individual therapy.  By the date of the 13th is ready to transition to rehab but again no suicidal thoughts plans or intent mood improved.  Stable and bright in  affect  Musculoskeletal: Strength & Muscle Tone: within normal limits Gait & Station: normal Patient leans: N/A  Psychiatric Specialty Exam: ROS  Blood pressure 114/81, pulse 95, temperature 99 F (37.2 C), temperature source Oral, resp. rate 18, height 5\' 2"  (1.575 m), weight 96.2 kg, last menstrual period 03/18/2019, SpO2 98 %.Body mass index is 38.78 kg/m.  General Appearance: Casual  Eye Contact::  Good  Speech:  Clear and Coherent409  Volume:  Normal  Mood:  Euthymic  Affect:  Congruent  Thought Process:  Coherent and Descriptions of Associations: Intact  Orientation:  Full (Time, Place, and Person)  Thought Content:  Logical  Suicidal Thoughts:  No  Homicidal Thoughts:  No  Memory:  Immediate;   Good  Judgement:  Good  Insight:  Good  Psychomotor Activity:  Normal  Concentration:  Good  Recall:  Good  Fund of Knowledge:Good  Language: Good  Akathisia:  Negative  Handed:  Right  AIMS (if indicated):     Assets:  Communication Skills Desire for Improvement Financial Resources/Insurance  Sleep:  Number of Hours: 6.75  Cognition: WNL  ADL's:  Intact    Have you used any form of tobacco in the last 30 days? (Cigarettes, Smokeless Tobacco, Cigars, and/or Pipes): Yes  Has this patient used any form of tobacco in the last 30 days? (Cigarettes, Smokeless Tobacco, Cigars, and/or Pipes) Yes, No  Blood Alcohol level:  Lab Results  Component Value Date   ETH 10 (H) 04/10/2019   ETH <10 03/30/2019    Metabolic Disorder Labs:  Lab Results  Component Value Date   HGBA1C 6.3 (H) 04/13/2019   MPG 134.11 04/13/2019   No results found for: PROLACTIN Lab Results  Component Value Date   CHOL 191 04/13/2019   TRIG 125 04/13/2019   HDL 64 04/13/2019   CHOLHDL 3.0 04/13/2019   VLDL 25 04/13/2019   LDLCALC 102 (H) 04/13/2019    See Psychiatric Specialty Exam and Suicide Risk Assessment completed by Attending Physician prior to discharge.  Discharge destination:   Home  Is patient on multiple antipsychotic therapies at discharge:  No   Has Patient had three or more failed trials of antipsychotic monotherapy by history:  No  Recommended Plan for Multiple Antipsychotic Therapies: NA   Allergies as of 04/15/2019   No Known Allergies     Medication List    STOP taking these medications   FLUoxetine 20 MG tablet Commonly known as: PROZAC Replaced by: FLUoxetine 20 MG capsule   QUEtiapine 200 MG tablet Commonly known as: SEROQUEL     TAKE these medications     Indication  ARIPiprazole 5 MG tablet Commonly known as: ABILIFY Take 1 tablet (5 mg total) by mouth daily. Start taking on: April 16, 2019  Indication: Major Depressive Disorder  EnBrace HR Caps Take 1 capsule by mouth daily at 12 noon.  Indication: Deficiency of Folic Acid   FLUoxetine 20 MG capsule Commonly known as: PROZAC Take 1 capsule (20 mg total) by mouth daily. Start taking on: April 16, 2019 Replaces: FLUoxetine 20 MG tablet  Indication: Depression   traZODone 100 MG tablet Commonly known as: DESYREL Take 1 tablet (100 mg total) by mouth at bedtime as needed for sleep.  Indication: Trouble Sleeping      Follow-up Information    [facility] Follow up on 04/15/2019.   Why: Patient will discharge to shelter on Monday, 7/13.  Contact information: [address] [place], [place] [address] Ph: [phone](336) [phone] Fx: [phone](336) [phone]       [facility] Follow up.   Contact information: [address] [place] [id number] [phone]           Follow-up recommendations:  Activity:  full  SignedMalvin Johns: Imogene Gravelle, MD 04/15/2019, 11:04 AM

## 2019-04-15 NOTE — Plan of Care (Signed)
  Problem: Education: Goal: Knowledge of [facility] Education information/materials will improve Outcome: Progressing Goal: Emotional status will improve Outcome: Progressing Goal: Mental status will improve Outcome: Progressing Goal: Verbalization of understanding the information provided will improve Outcome: Progressing   Problem: Activity: Goal: Interest or engagement in activities will improve Outcome: Progressing   

## 2019-05-16 ENCOUNTER — Encounter (HOSPITAL_COMMUNITY): Payer: Self-pay

## 2019-05-16 ENCOUNTER — Other Ambulatory Visit: Payer: Self-pay

## 2019-05-16 ENCOUNTER — Emergency Department (HOSPITAL_COMMUNITY)
Admission: EM | Admit: 2019-05-16 | Discharge: 2019-05-16 | Disposition: A | Discharge status: Home / Self Care | Payer: Self-pay | Attending: Emergency Medicine | Admitting: Emergency Medicine

## 2019-05-16 DIAGNOSIS — F17210 Nicotine dependence, cigarettes, uncomplicated: Secondary | ICD-10-CM | POA: Insufficient documentation

## 2019-05-16 DIAGNOSIS — F1920 Other psychoactive substance dependence, uncomplicated: Secondary | ICD-10-CM | POA: Insufficient documentation

## 2019-05-16 DIAGNOSIS — T7421XA Adult sexual abuse, confirmed, initial encounter: Secondary | ICD-10-CM

## 2019-05-16 DIAGNOSIS — F19920 Other psychoactive substance use, unspecified with intoxication, uncomplicated: Secondary | ICD-10-CM

## 2019-05-16 LAB — COMPREHENSIVE METABOLIC PANEL
ALT: 16 U/L (ref 0–44)
AST: 21 U/L (ref 15–41)
Albumin: 3.8 g/dL (ref 3.5–5.0)
Alkaline Phosphatase: 65 U/L (ref 38–126)
Anion gap: 9 (ref 5–15)
BUN: 9 mg/dL (ref 6–20)
CO2: 23 mmol/L (ref 22–32)
Calcium: 9 mg/dL (ref 8.9–10.3)
Chloride: 107 mmol/L (ref 98–111)
Creatinine, Ser: 0.85 mg/dL (ref 0.44–1.00)
GFR calc Af Amer: >60 mL/min (ref >60)
GFR calc non Af Amer: >60 mL/min (ref >60)
Glucose, Bld: 104 mg/dL — ABNORMAL HIGH (ref 70–99)
Potassium: 3.7 mmol/L (ref 3.5–5.1)
Sodium: 139 mmol/L (ref 135–145)
Total Bilirubin: 0.4 mg/dL (ref 0.3–1.2)
Total Protein: 7.7 g/dL (ref 6.5–8.1)

## 2019-05-16 LAB — CBC
HCT: 39.3 % (ref 36.0–46.0)
Hemoglobin: 12.2 g/dL (ref 12.0–15.0)
MCH: 28.2 pg (ref 26.0–34.0)
MCHC: 31 g/dL (ref 30.0–36.0)
MCV: 91 fL (ref 80.0–100.0)
Platelets: 419 10*3/uL — ABNORMAL HIGH (ref 150–400)
RBC: 4.32 MIL/uL (ref 3.87–5.11)
RDW: 15.1 % (ref 11.5–15.5)
WBC: 11.2 10*3/uL — ABNORMAL HIGH (ref 4.0–10.5)
nRBC: 0 % (ref 0.0–0.2)

## 2019-05-16 LAB — RAPID URINE DRUG SCREEN, HOSP PERFORMED
Amphetamines: NOT DETECTED
Barbiturates: NOT DETECTED
Benzodiazepines: POSITIVE — AB
Cocaine: POSITIVE — AB
Opiates: NOT DETECTED
Tetrahydrocannabinol: NOT DETECTED

## 2019-05-16 LAB — I-STAT BETA HCG BLOOD, ED (MC, WL, AP ONLY): I-stat hCG, quantitative: <5 m[IU]/mL (ref <5)

## 2019-05-16 LAB — ETHANOL: Alcohol, Ethyl (B): <10 mg/dL (ref <10)

## 2019-05-16 MED ORDER — ACETAMINOPHEN 325 MG PO TABS
650.0000 mg | ORAL_TABLET | Freq: Once | ORAL | Status: AC
  Administered 2019-05-16: 15:00:00 650 mg via ORAL
  Filled 2019-05-16: qty 2

## 2019-05-16 MED ORDER — NALOXONE HCL 2 MG/2ML IJ SOSY
PREFILLED_SYRINGE | INTRAMUSCULAR | Status: AC
  Administered 2019-05-16: 07:00:00
  Filled 2019-05-16: qty 2

## 2019-05-16 NOTE — ED Notes (Signed)
Pt unable to give urine sample at this time 
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
## 2019-05-16 NOTE — ED Notes (Addendum)
Patient now reports feeling suicidal. She states "There is no reason for me to be here" as well. MD made aware.

## 2019-05-16 NOTE — ED Notes (Signed)
Attempt to set up telesitter monitor. Monitor will call back when She is able to view a picture.
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
## 2019-05-16 NOTE — Progress Notes (Addendum)
2nd shift ED CSW received a handoff from the 1st shift [facility] ED CSW.   CSW received a call from pt's [facility] who states she is leaving the ED to procure the pt a hotel or motel room and then will return to p/u the pt and transport the pt there.      CSW awaiting return call from advocate who stated the expected to be back at approx 5:45pm to p/u the pt.  RN/EDP updated.  CSW will continue to follow for D/C needs.  Alphonse Guild. Samanda Buske, LCSW, LCAS, CSI Transitions of Care Clinical Social Worker Care Coordination Department Ph: [phone]

## 2019-05-16 NOTE — Discharge Instructions (Addendum)
Substance Abuse Treatment Programs ° °Intensive Outpatient Programs °[facility]     °[address]      °[place], [place]                   °[phone]      ° °[facility] °[address] °[place], [place] °[phone] ° °[facility]     °(Inpatient and outpatient)     °[address]           °[phone]   ° °[facility] °[phone] (Suboxone and Methadone) ° °[address]      °[place], [place] [address]      °[phone]      ° °[address] °[place], [place] °852-3033 ° °[facility] (Outpatient/Inpatient, [facility])    °(insurance only) [phone]      °       °[facility] (Groups & Residential) °[place], [place] °[phone] ° °   °[facility]     °[address]     °[place], [place]      °[phone]      ° °[facility] (for caregivers and family) °[address] °[place], [place] °336-299-4655 ° ° ° ° ° °Residential Treatment Programs °[facility]      °[address], [place], [place] [address]  °([phone]      ° °[facility] °[address] , [place] [address] °[phone] ° °[facility]        °336-248-8914      ° °[facility] °1-800-659-3381 ° °[facility]             °[address]                                         °[place], [place]                                                °877-615-2722 or 336-784-9470                              ° °[facility] [place] °[address] °[place], [address] °1.877.941.8954 ° °[facility]    °[address]      °[place], [place]     °[phone]      ° °[facility] °[address] °[place], [place] °336-285-9073 ° °[facility]   °[address]     °[place], [place] [address]     °[phone]      °Admissions: 8am-3pm M-F ° °[facility] °[address] °,  [place] °336-227-7417 ° °[facility]: Residential Program (90 Days)   °[place], [place]      °336-725-8389 or 800-758-6077    ° °[facility] °[place], [place] °(Walk in Hours over the weekend or by referral) ° °[facility] °[address], [place],  [address] °([phone] ° °Crisis Mobile: [facility]:  1-877-626-1772 (for crisis response 24 hours a day) °[facility] Hotline:      1-800-256-2452 °Outpatient Psychiatry and Counseling ° °[facility]: Mobile Crisis   Management 24 hours:  1-877-626-1772 ° °[facility] sliding scale fee and walk in schedule: M-F 8am-12pm/1pm-3pm °[address]  °[place], [place] [address] °[phone] ° °[facility] °[address] °[place], [place] [address] °336-703-9650 ° °[facility] ([facility])- new patient walk-in appointments available Monday - Friday 8am -3pm.          °[address] °[place], [place] [address] °336-676-6840 or crisis line- 336-676-6905 ° °[facility]/ Intensive Outpatient Therapy Program °[address] °[place], [place] [address] °336-832-9804 ° °[facility]                  °Crisis Services      °[phone]      °[address]     °[place], [place] [address]                ° °[facility]   °[facility] °[phone] °[address] °[place], [place] [address] ° ° °[facility]          °[address]  °[place], [place] [address]       °([phone] ° °[facility] °[address] °[place], [place] [address] °336-292-1510 ° °[facility]    °[address]    °[place], [place] [address]     °[phone]      ° °Parish McKinney, MD     °[address]     °[place] [address]     °336-282-1251     °  °[facility] °[address] °[place] [address] ° °[facility]     °[address]     °[place], [place]      °336-542-2076      ° °[facility] °Shamsher Ahluwalia, MD °[address] °[place], [place] [address] °[phone] ° °[facility]     °[address]     °[place], [place] [address]     °336-274-1237      ° °[facility] °[address] °[place], [place] [address] °336-854-4450 °Resources for Temporary Residential Assistance/Crisis Centers ° °DAY CENTERS °[facility] °M-F 8am-3pm   °[address] [place], [place] [address]   336-332-0824 °Services include: laundry, barbering, support groups, case management, phone  & computer access, showers, AA/NA mtgs, mental health/substance abuse nurse, job skills class, disability information, VA assistance, spiritual classes, etc.  ° °HOMELESS SHELTERS ° °[facility]     °[facility]   °[address], [place]     °336.271.5959       °       °[facility] (women and children)       °[address] °[place],  [address] °336-275-0820 °[email or web address] for application and process °Application Required ° °[facility]   °[address]    °[place]  [address]     °[phone]       °             °[facility] °[address] °,  [address] °336.273.5572 °336-235-0363(schedule application appt.) °Application Required ° °[facility] (women only)    °[address]     °[place],  [address]     °336-884-1039      °  Intake starts 6pm daily °Need valid ID, SSC, & Police report °[facility] [place] °[address] °[place], [place] °336-881-5420 °Application Required ° °[facility] (men only)     °[address]      °[place], [place]     °[phone]      ° °[facility] °(Pregnant women only) °[address] °[place], [place] °336-275-0206 ° °[facility]      °[address]      °[place], [place] [address]     °[phone]      °       °[facility] °[address] °[place], [place] °336-723-1848 °90 day commitment/SA/Application process ° °[facility](men only)     °[address]     °[place], [place]     °[phone]       °Check-in at 7pm     °       °[facility] °[address] °[place], [place] [address] °336-248-6684 °Men/Women/Women and Children must be there by 7 pm ° °[facility] °[place],  °336-722-8721                ° °
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
## 2019-06-23 ENCOUNTER — Other Ambulatory Visit: Payer: Self-pay | Admitting: Adult Health

## 2019-06-23 ENCOUNTER — Emergency Department (HOSPITAL_COMMUNITY)
Admission: EM | Admit: 2019-06-23 | Discharge: 2019-06-24 | Disposition: A | Discharge status: Other Acute Inpatient Hospital | Payer: Self-pay | Attending: Emergency Medicine | Admitting: Emergency Medicine

## 2019-06-23 ENCOUNTER — Encounter (HOSPITAL_COMMUNITY): Payer: Self-pay | Admitting: Emergency Medicine

## 2019-06-23 ENCOUNTER — Other Ambulatory Visit: Payer: Self-pay | Admitting: Family

## 2019-06-23 DIAGNOSIS — Z79899 Other long term (current) drug therapy: Secondary | ICD-10-CM | POA: Insufficient documentation

## 2019-06-23 DIAGNOSIS — F333 Major depressive disorder, recurrent, severe with psychotic symptoms: Secondary | ICD-10-CM | POA: Insufficient documentation

## 2019-06-23 DIAGNOSIS — F1410 Cocaine abuse, uncomplicated: Secondary | ICD-10-CM | POA: Insufficient documentation

## 2019-06-23 DIAGNOSIS — R45851 Suicidal ideations: Secondary | ICD-10-CM | POA: Insufficient documentation

## 2019-06-23 DIAGNOSIS — F17210 Nicotine dependence, cigarettes, uncomplicated: Secondary | ICD-10-CM | POA: Insufficient documentation

## 2019-06-23 DIAGNOSIS — Z20828 Contact with and (suspected) exposure to other viral communicable diseases: Secondary | ICD-10-CM | POA: Insufficient documentation

## 2019-06-23 DIAGNOSIS — F1010 Alcohol abuse, uncomplicated: Secondary | ICD-10-CM | POA: Insufficient documentation

## 2019-06-23 LAB — CBC
HCT: 41.4 % (ref 36.0–46.0)
Hemoglobin: 12.5 g/dL (ref 12.0–15.0)
MCH: 27.8 pg (ref 26.0–34.0)
MCHC: 30.2 g/dL (ref 30.0–36.0)
MCV: 92.2 fL (ref 80.0–100.0)
Platelets: 365 10*3/uL (ref 150–400)
RBC: 4.49 MIL/uL (ref 3.87–5.11)
RDW: 14.8 % (ref 11.5–15.5)
WBC: 9.4 10*3/uL (ref 4.0–10.5)
nRBC: 0 % (ref 0.0–0.2)

## 2019-06-23 LAB — RAPID URINE DRUG SCREEN, HOSP PERFORMED
Amphetamines: NOT DETECTED
Barbiturates: NOT DETECTED
Benzodiazepines: NOT DETECTED
Cocaine: POSITIVE — AB
Opiates: NOT DETECTED
Tetrahydrocannabinol: NOT DETECTED

## 2019-06-23 LAB — COMPREHENSIVE METABOLIC PANEL
ALT: 18 U/L (ref 0–44)
AST: 18 U/L (ref 15–41)
Albumin: 3.7 g/dL (ref 3.5–5.0)
Alkaline Phosphatase: 61 U/L (ref 38–126)
Anion gap: 8 (ref 5–15)
BUN: 15 mg/dL (ref 6–20)
CO2: 24 mmol/L (ref 22–32)
Calcium: 9.1 mg/dL (ref 8.9–10.3)
Chloride: 103 mmol/L (ref 98–111)
Creatinine, Ser: 0.94 mg/dL (ref 0.44–1.00)
GFR calc Af Amer: >60 mL/min (ref >60)
GFR calc non Af Amer: >60 mL/min (ref >60)
Glucose, Bld: 110 mg/dL — ABNORMAL HIGH (ref 70–99)
Potassium: 3.9 mmol/L (ref 3.5–5.1)
Sodium: 135 mmol/L (ref 135–145)
Total Bilirubin: 0.3 mg/dL (ref 0.3–1.2)
Total Protein: 7.4 g/dL (ref 6.5–8.1)

## 2019-06-23 LAB — I-STAT BETA HCG BLOOD, ED (MC, WL, AP ONLY): I-stat hCG, quantitative: <5 m[IU]/mL (ref <5)

## 2019-06-23 LAB — ACETAMINOPHEN LEVEL: Acetaminophen (Tylenol), Serum: <10 ug/mL — ABNORMAL LOW (ref 10–30)

## 2019-06-23 LAB — SALICYLATE LEVEL: Salicylate Lvl: <7 mg/dL (ref 2.8–30.0)

## 2019-06-23 LAB — ETHANOL: Alcohol, Ethyl (B): <10 mg/dL (ref <10)

## 2019-06-23 LAB — SARS CORONAVIRUS 2 BY RT PCR (HOSPITAL ORDER, PERFORMED IN ~~LOC~~ HOSPITAL LAB): SARS Coronavirus 2: NEGATIVE

## 2019-06-23 MED ORDER — ACETAMINOPHEN 325 MG PO TABS: 650.0000 mg | ORAL_TABLET | ORAL | Status: DC | PRN
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
## 2019-06-23 NOTE — Patient Outreach (Addendum)
CPSS tried meeting with the patient in order to provide substance use recovery support and provide information for substance use recovery resources, but the patient still is unable to stay awake during the CPSS assessment. Patient was still very sleepy and unable to talk about substance use recovery resources at this time. CPSS was unable to meet with the patient at 11:30 am and 2:30 pm on 06/23/19 as well. CPSS left information for substance use recovery resources at bedside including residential/outpatient substance use treatment center list, [employer], [employer], [place] meeting list, and CPSS contact information. Per Merlyn Lot, NP, the patient is currently being recommended to stay overnight for observation and safety. CPSS will follow up with the patient tomorrow 06/24/19 in order to provide further substance use recovery support and help with getting connected to substance use recovery resources.
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
## 2019-06-23 NOTE — ED Notes (Signed)
TTS and Peer Support unable to speak with pt at this time because pt just cannot stay awake. Discontinued Peer Support temporarily and will reorder when pt wakes up.
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
## 2019-06-23 NOTE — ED Notes (Signed)
Pt belongings consist of two white pt belonging bags and a black purse **Located in locker 32**

## 2019-06-23 NOTE — Patient Outreach (Signed)
CPSS tried meeting with the patient in order to provide substance use recovery support and provide information for substance use treatment resources. Patient is very sleepy and is unable to stay awake during the CPSS assessment. CPSS tried meeting with the patient at 11:30 am on 06/23/19 and the patient was unable to participate in the CPSS assessment at that time as well. Per triage note, patient reports a history of crack cocaine and alcohol use.

## 2019-06-23 NOTE — ED Notes (Signed)
TTS in progress 

## 2019-06-23 NOTE — BH Assessment (Signed)
Per Nurse;  Patient is sleeping , unable to complete a tele-assessment at this time.

## 2019-06-23 NOTE — ED Triage Notes (Signed)
Per EMS-patient walked in to [facility] stating she was suicidal-patient has been using crack and ETOH-patient falling asleep in route

## 2019-06-23 NOTE — Progress Notes (Signed)
Received Jordan Walton this PM in her room asleep in bed. She continued to sleep throughout the evening. OBS was called at 2330 and will call this writer back when they are ready for [facility] called and report was given, the transferred order was place and [facility] transporters called. She was transported at 0142 hrs with her personal belongings.

## 2019-06-23 NOTE — ED Notes (Signed)
Pt accepted to Obs Unit when her Covid test result is back.

## 2019-06-24 ENCOUNTER — Inpatient Hospital Stay (HOSPITAL_COMMUNITY)
Admission: AD | Admit: 2019-06-24 | Discharge: 2019-06-27 | DRG: 897 | Disposition: A | Discharge status: Home / Self Care | Payer: No Typology Code available for payment source | Source: Intra-hospital | Attending: Psychiatry | Admitting: Psychiatry

## 2019-06-24 ENCOUNTER — Other Ambulatory Visit: Payer: Self-pay

## 2019-06-24 ENCOUNTER — Encounter (HOSPITAL_COMMUNITY): Payer: Self-pay

## 2019-06-24 ENCOUNTER — Observation Stay (HOSPITAL_COMMUNITY)
Admission: AD | Admit: 2019-06-24 | Discharge: 2019-06-24 | Disposition: A | Discharge status: Other Acute Inpatient Hospital | Payer: Federal, State, Local not specified - Other | Source: Intra-hospital | Attending: Psychiatry | Admitting: Psychiatry

## 2019-06-24 ENCOUNTER — Encounter (HOSPITAL_COMMUNITY): Payer: Self-pay | Admitting: Emergency Medicine

## 2019-06-24 DIAGNOSIS — Z20828 Contact with and (suspected) exposure to other viral communicable diseases: Secondary | ICD-10-CM | POA: Diagnosis present

## 2019-06-24 DIAGNOSIS — F1414 Cocaine abuse with cocaine-induced mood disorder: Secondary | ICD-10-CM | POA: Diagnosis present

## 2019-06-24 DIAGNOSIS — Z590 Homelessness: Secondary | ICD-10-CM | POA: Insufficient documentation

## 2019-06-24 DIAGNOSIS — R45851 Suicidal ideations: Secondary | ICD-10-CM | POA: Insufficient documentation

## 2019-06-24 DIAGNOSIS — R440 Auditory hallucinations: Secondary | ICD-10-CM | POA: Insufficient documentation

## 2019-06-24 DIAGNOSIS — F332 Major depressive disorder, recurrent severe without psychotic features: Secondary | ICD-10-CM | POA: Diagnosis not present

## 2019-06-24 DIAGNOSIS — F329 Major depressive disorder, single episode, unspecified: Secondary | ICD-10-CM | POA: Diagnosis present

## 2019-06-24 DIAGNOSIS — F319 Bipolar disorder, unspecified: Secondary | ICD-10-CM | POA: Insufficient documentation

## 2019-06-24 DIAGNOSIS — F419 Anxiety disorder, unspecified: Secondary | ICD-10-CM | POA: Insufficient documentation

## 2019-06-24 DIAGNOSIS — F1410 Cocaine abuse, uncomplicated: Secondary | ICD-10-CM | POA: Diagnosis present

## 2019-06-24 DIAGNOSIS — G4700 Insomnia, unspecified: Secondary | ICD-10-CM | POA: Insufficient documentation

## 2019-06-24 DIAGNOSIS — F1994 Other psychoactive substance use, unspecified with psychoactive substance-induced mood disorder: Secondary | ICD-10-CM

## 2019-06-24 DIAGNOSIS — F17210 Nicotine dependence, cigarettes, uncomplicated: Secondary | ICD-10-CM | POA: Diagnosis present

## 2019-06-24 DIAGNOSIS — F3130 Bipolar disorder, current episode depressed, mild or moderate severity, unspecified: Secondary | ICD-10-CM | POA: Diagnosis present

## 2019-06-24 DIAGNOSIS — Z765 Malingerer [conscious simulation]: Secondary | ICD-10-CM | POA: Diagnosis not present

## 2019-06-24 DIAGNOSIS — Z79899 Other long term (current) drug therapy: Secondary | ICD-10-CM | POA: Insufficient documentation

## 2019-06-24 MED ORDER — LOPERAMIDE HCL 2 MG PO CAPS: 2.0000 mg | ORAL_CAPSULE | ORAL | Status: DC | PRN

## 2019-06-24 MED ORDER — ALUM & MAG HYDROXIDE-SIMETH 200-200-20 MG/5ML PO SUSP: 30.0000 mL | ORAL | Status: DC | PRN

## 2019-06-24 MED ORDER — ONDANSETRON 4 MG PO TBDP: 4.0000 mg | ORAL_TABLET | Freq: Four times a day (QID) | ORAL | Status: DC | PRN

## 2019-06-24 MED ORDER — LOPERAMIDE HCL 2 MG PO CAPS
2.0000 mg | ORAL_CAPSULE | ORAL | Status: AC | PRN
  Administered 2019-06-26: 2 mg via ORAL
  Filled 2019-06-24: qty 1

## 2019-06-24 MED ORDER — LORAZEPAM 1 MG PO TABS: 1.0000 mg | ORAL_TABLET | Freq: Four times a day (QID) | ORAL | Status: DC | PRN

## 2019-06-24 MED ORDER — TRAZODONE HCL 150 MG PO TABS
150.0000 mg | ORAL_TABLET | Freq: Every evening | ORAL | Status: DC | PRN
  Administered 2019-06-24 – 2019-06-26 (×3): 150 mg via ORAL
  Filled 2019-06-24: qty 1
  Filled 2019-06-24: qty 7
  Filled 2019-06-24 (×2): qty 1

## 2019-06-24 MED ORDER — ACETAMINOPHEN 325 MG PO TABS
650.0000 mg | ORAL_TABLET | Freq: Four times a day (QID) | ORAL | Status: DC | PRN
  Administered 2019-06-26 – 2019-06-27 (×3): 650 mg via ORAL
  Filled 2019-06-24 (×3): qty 2

## 2019-06-24 MED ORDER — MAGNESIUM HYDROXIDE 400 MG/5ML PO SUSP: 30.0000 mL | Freq: Every day | ORAL | Status: DC | PRN

## 2019-06-24 MED ORDER — ACETAMINOPHEN 325 MG PO TABS: 650.0000 mg | ORAL_TABLET | Freq: Four times a day (QID) | ORAL | Status: DC | PRN

## 2019-06-24 MED ORDER — ONDANSETRON 4 MG PO TBDP: 4.0000 mg | ORAL_TABLET | Freq: Four times a day (QID) | ORAL | Status: AC | PRN

## 2019-06-24 MED ORDER — ZIPRASIDONE MESYLATE 20 MG IM SOLR: 20.0000 mg | Freq: Two times a day (BID) | INTRAMUSCULAR | Status: DC | PRN

## 2019-06-24 MED ORDER — TRAZODONE HCL 50 MG PO TABS: 50.0000 mg | ORAL_TABLET | Freq: Every evening | ORAL | Status: DC | PRN

## 2019-06-24 MED ORDER — HYDROXYZINE HCL 25 MG PO TABS: 25.0000 mg | ORAL_TABLET | Freq: Three times a day (TID) | ORAL | Status: DC | PRN

## 2019-06-24 MED ORDER — HYDROXYZINE HCL 25 MG PO TABS
25.0000 mg | ORAL_TABLET | Freq: Three times a day (TID) | ORAL | Status: DC | PRN
  Administered 2019-06-24 – 2019-06-27 (×4): 25 mg via ORAL
  Filled 2019-06-24 (×2): qty 1
  Filled 2019-06-24: qty 10
  Filled 2019-06-24 (×3): qty 1

## 2019-06-24 MED ORDER — LORAZEPAM 1 MG PO TABS: 1.0000 mg | ORAL_TABLET | ORAL | Status: DC | PRN

## 2019-06-24 MED ORDER — QUETIAPINE FUMARATE 25 MG PO TABS
25.0000 mg | ORAL_TABLET | Freq: Three times a day (TID) | ORAL | Status: DC
  Administered 2019-06-24 – 2019-06-27 (×9): 25 mg via ORAL
  Filled 2019-06-24 (×2): qty 21
  Filled 2019-06-24: qty 1
  Filled 2019-06-24 (×2): qty 21
  Filled 2019-06-24: qty 1
  Filled 2019-06-24 (×2): qty 21
  Filled 2019-06-24 (×3): qty 1
  Filled 2019-06-24: qty 21
  Filled 2019-06-24: qty 1
  Filled 2019-06-24: qty 21
  Filled 2019-06-24: qty 1
  Filled 2019-06-24: qty 21
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
## 2019-06-24 NOTE — Progress Notes (Signed)
Admission Note: Patient is a 28 year old female admitted to the unit for suicidal ideation with a plan to go with a strange man who may hurt or kill her.  Patient is alert and oriented to person, place and time.  Patient presents with irritable affect and mood.  Admission plan of care reviewed and consent signed.  Skin assessment and personal belongings completed.  No contraband found. Old scabs noted on both lower extremities.  States she's here to work on her attitude and stay positive.  Patient oriented to the unit, staff and room. Verbalizes understanding of unit rules and protocols.  Routine safety checks initiated.  Offered support and encourages as needed.  Patient is safe on the unit.

## 2019-06-24 NOTE — BH Assessment (Signed)
[place] Assessment Progress Note  Per Letitia Libra, FNP, this pt requires psychiatric hospitalization at this time.  P has been assigned pt to [facility] Rm 501-1.  Pt's nurse, Legrand Como, has been notified.  Jalene Mullet, [place] Coordinator [phone]

## 2019-06-24 NOTE — Tx Team (Signed)
Initial Treatment Plan 06/24/2019 5:53 PM Jordan Walton IWL:[id number]    PATIENT STRESSORS: Financial difficulties Substance abuse   PATIENT STRENGTHS: Capable of independent living Communication skills   PATIENT IDENTIFIED PROBLEMS: Suicidal ideation  Substance Abuse  Depression                 DISCHARGE CRITERIA:  Ability to meet basic life and health needs Motivation to continue treatment in a less acute level of care  PRELIMINARY DISCHARGE PLAN: Outpatient therapy Placement in alternative living arrangements  PATIENT/FAMILY INVOLVEMENT: This treatment plan has been presented to and reviewed with the patient, Jordan Walton, and/or family member.  The patient and family have been given the opportunity to ask questions and make suggestions.  Vela Prose, RN 06/24/2019, 5:53 PM

## 2019-06-24 NOTE — Plan of Care (Signed)
[facility] Observation Crisis Plan  Reason for Crisis Plan:  Crisis Stabilization   Plan of Care:  Referral for Inpatient Hospitalization  Family Support:      Current Living Environment:     Insurance:   Hospital Account    Name Acct ID Class Status Primary Coverage   Jordan Walton, Jordan Walton [id number] [place] Open None        Guarantor Account (for Hospital Account [id number])    Name Relation to Pt Service Area Active? Acct Type   Jordan Walton, Jordan Walton   Address Phone       [name], [name] [address] [phone])          Coverage Information (for Hospital Account [id number])    Not on file      Legal Guardian:     Primary Care Provider:  Patient, No Pcp Per  Current Outpatient Providers:  [facility]  Psychiatrist:     Counselor/Therapist:     Compliant with Medications:  No  Additional Information:   Jordan Walton 9/21/20202:57 AM

## 2019-06-24 NOTE — BHH Suicide Risk Assessment (Signed)
[facility] Admission Suicide Risk Assessment   Total Time spent with patient: 45 minutes Principal Problem: Homelessness/cocaine abuse/reports depressive symptoms/issues of secondary gain Diagnosis:  Active Problems:   Cocaine abuse with cocaine-induced mood disorder (HCC)   MDD (major depressive disorder)  Subjective Data: Repeat admission for a 28 year old homeless individual requesting rehab requesting detox off of cocaine reporting suicidal thoughts without plans to harm herself here  Continued Clinical Symptoms:    The "Alcohol Use Disorders Identification Test", Guidelines for Use in Primary Care, Second Edition.  World Pharmacologist [facility]). Score between 0-7:  no or low risk or alcohol related problems. Score between 8-15:  moderate risk of alcohol related problems. Score between 16-19:  high risk of alcohol related problems. Score 20 or above:  warrants further diagnostic evaluation for alcohol dependence and treatment.   CLINICAL FACTORS:   Dysthymia Alcohol/Substance Abuse/Dependencies Personality Disorders:   Cluster B   Musculoskeletal: Strength & Muscle Tone: within normal limits Gait & Station: normal Patient leans: N/A  Psychiatric Specialty Exam: Physical Exam  ROS  Blood pressure 127/87, pulse 97, temperature 98 F (36.7 C), temperature source Oral, resp. rate 18, height 5\' 1"  (1.549 m), weight 89.8 kg, SpO2 98 %.Body mass index is 37.41 kg/m.  General Appearance: Casual  Eye Contact:  Minimal  Speech:  Normal Rate  Volume:  Decreased  Mood:  Dysphoric  Affect:  Congruent  Thought Process:  Goal Directed  Orientation:  Full (Time, Place, and Person)  Thought Content:  Logical and Rumination  Suicidal Thoughts:  Yes.  without intent/plan  Homicidal Thoughts:  No  Memory:  Immediate;   Poor  Judgement:  Fair  Insight:  Fair  Psychomotor Activity:  Normal  Concentration:  Concentration: Fair and Attention Span: Fair  Recall:  Good  Fund of Knowledge:   Good  Language: Normal rate and tone  Akathisia:  Negative  Handed:  Right  AIMS (if indicated):     Assets:  Communication Skills Desire for Improvement Leisure Time Physical Health  ADL's:  Intact  Cognition:  WNL  Sleep:         COGNITIVE FEATURES THAT CONTRIBUTE TO RISK:  None    SUICIDE RISK:   Mild:  Suicidal ideation of limited frequency, intensity, duration, and specificity.  There are no identifiable plans, no associated intent, mild dysphoria and related symptoms, good self-control (both objective and subjective assessment), few other risk factors, and identifiable protective factors, including available and accessible social support.  PLAN OF CARE: Probably more motivated for housing purposes and truly suicidal and will monitor for safety on 15-minute precautions  I certify that inpatient services furnished can reasonably be expected to improve the patient's condition.   Johnn Hai, MD 06/24/2019, 4:17 PM
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
## 2019-06-24 NOTE — Progress Notes (Signed)
Discharge/transfer note  Report was given to staff on [address] hall. Pt verbalized readiness to be taken to assigned room. Pt compliant with discharge/transfer process. Pt taken to search room for admission process.

## 2019-06-25 MED ORDER — FLUOXETINE HCL 20 MG PO CAPS
20.0000 mg | ORAL_CAPSULE | Freq: Every day | ORAL | Status: DC
  Administered 2019-06-25 – 2019-06-27 (×3): 20 mg via ORAL
  Filled 2019-06-25: qty 7
  Filled 2019-06-25 (×2): qty 1
  Filled 2019-06-25: qty 7
  Filled 2019-06-25: qty 1
  Filled 2019-06-25: qty 7
  Filled 2019-06-25: qty 1

## 2019-06-25 MED ORDER — AMANTADINE HCL 100 MG PO CAPS
100.0000 mg | ORAL_CAPSULE | Freq: Two times a day (BID) | ORAL | Status: DC
  Administered 2019-06-25 – 2019-06-27 (×5): 100 mg via ORAL
  Filled 2019-06-25: qty 1
  Filled 2019-06-25: qty 14
  Filled 2019-06-25: qty 1
  Filled 2019-06-25 (×2): qty 14
  Filled 2019-06-25 (×2): qty 21
  Filled 2019-06-25: qty 14
  Filled 2019-06-25: qty 1
  Filled 2019-06-25: qty 21
  Filled 2019-06-25: qty 1
  Filled 2019-06-25: qty 21

## 2019-06-25 NOTE — H&P (Signed)
Psychiatric Admission Assessment Adult  Patient Identification: Jordan Walton MRN:  [id number] Date of Evaluation:  06/25/2019 Chief Complaint:  SUBSTANCE INDUCED MOOD DISORDER Principal Diagnosis: Issues of secondary gain Diagnosis:  Active Problems:   Cocaine abuse with cocaine-induced mood disorder (HCC)   MDD (major depressive disorder)  History of Present Illness:  This is a repeat admission for Jordan Walton she is 56 she is homeless she was last here in July and she was transitioned to rehab.  At that point in time she was abusing alcohol cocaine and amphetamines, was treated with fluoxetine and aripiprazole and released to rehab.  At this point in time she is very irritable stating she wants to stay here until she goes to long-term treatment.  When I began discussing discharge planning at the end of the interview she became very angry, verbally abusive insisting she was not going to leave here "she just got here" and explained to her that I was not discussing her discharge because she was to be discharged today but rather we just wanted to get a plan in mind.  At any rate she has clear issues of secondary gain.  She has been abusing cocaine states she is binging on it but will not tell me how much.  She denies auditory or visual hallucinations.  Later in the interview she endorses hallucinations of both eyes but would not elaborate on content so again this is probably malingering.  She also states she is suicidal but has no plans and can contract for safety here.  Again this interview was marked by issues of secondary gain.   Associated Signs/Symptoms: Depression Symptoms:  depressed mood, (Hypo) Manic Symptoms:  n/a Anxiety Symptoms:  n/a Psychotic Symptoms:  Reporting psychotic symptoms after initially denying them probably malingering this PTSD Symptoms: NA Total Time spent with patient: 45 minutes  Past Psychiatric History: Last here in July similar presentation  Is the  patient at risk to self? No.  Has the patient been a risk to self in the past 6 months? No.  Has the patient been a risk to self within the distant past? No.  Is the patient a risk to others? No.  Has the patient been a risk to others in the past 6 months? No.  Has the patient been a risk to others within the distant past? No.   Prior Inpatient Therapy:   As discussed last year July 9 - July 2013 Prior Outpatient Therapy:   Not consistent with follow-up but cannot give me more specifics  Alcohol Screening: 1. How often do you have a drink containing alcohol?: Never 2. How many drinks containing alcohol do you have on a typical day when you are drinking?: 1 or 2 3. How often do you have six or more drinks on one occasion?: Never AUDIT-C Score: 0 4. How often during the last year have you found that you were not able to stop drinking once you had started?: Never 5. How often during the last year have you failed to do what was normally expected from you becasue of drinking?: Never 6. How often during the last year have you needed a first drink in the morning to get yourself going after a heavy drinking session?: Never 7. How often during the last year have you had a feeling of guilt of remorse after drinking?: Never 8. How often during the last year have you been unable to remember what happened the night before because you had been drinking?: Never 9. Have  you or someone else been injured as a result of your drinking?: No 10. Has a relative or friend or a doctor or another health worker been concerned about your drinking or suggested you cut down?: No Alcohol Use Disorder Identification Test Final Score (AUDIT): 0 Substance Abuse History in the last 12 months:  Yes.   Consequences of Substance Abuse: NA Previous Psychotropic Medications: Yes  Psychological Evaluations: No  Past Medical History:  Past Medical History:  Diagnosis Date  . Crack cocaine use    History reviewed. No pertinent  surgical history. Family History: History reviewed. No pertinent family history. Family Psychiatric  History: neg Tobacco Screening: Have you used any form of tobacco in the last 30 days? (Cigarettes, Smokeless Tobacco, Cigars, and/or Pipes): Yes Tobacco use, Select all that apply: 5 or more cigarettes per day Are you interested in Tobacco Cessation Medications?: Yes, will notify MD for an order Counseled patient on smoking cessation including recognizing danger situations, developing coping skills and basic information about quitting provided: Refused/Declined practical counseling Social History:  Social History   Substance and Sexual Activity  Alcohol Use Yes  . Alcohol/week: 6.0 - 12.0 standard drinks  . Types: 6 - 12 Cans of beer per week     Social History   Substance and Sexual Activity  Drug Use Yes  . Frequency: 7.0 times per week  . Types: "Crack" cocaine   Comment: Daily use -- up to a gram    Additional Social History:                           Allergies:  No Known Allergies Lab Results:  Results for orders placed or performed during the hospital encounter of 06/23/19 (from the past 48 hour(s))  Comprehensive metabolic panel     Status: Abnormal   Collection Time: 06/23/19 11:09 AM  Result Value Ref Range   Sodium 135 135 - 145 mmol/L   Potassium 3.9 3.5 - 5.1 mmol/L   Chloride 103 98 - 111 mmol/L   CO2 24 22 - 32 mmol/L   Glucose, Bld 110 (H) 70 - 99 mg/dL   BUN 15 6 - 20 mg/dL   Creatinine, Ser 1.610.94 0.44 - 1.00 mg/dL   Calcium 9.1 8.9 - 09.610.3 mg/dL   Total Protein 7.4 6.5 - 8.1 g/dL   Albumin 3.7 3.5 - 5.0 g/dL   AST 18 15 - 41 U/L   ALT 18 0 - 44 U/L   Alkaline Phosphatase 61 38 - 126 U/L   Total Bilirubin 0.3 0.3 - 1.2 mg/dL   GFR calc non Af Amer >60 >60 mL/min   GFR calc Af Amer >60 >60 mL/min   Anion gap 8 5 - 15    Comment: Performed at [facility], [address] [place], [place] [address]  Ethanol     Status: None    Collection Time: 06/23/19 11:09 AM  Result Value Ref Range   Alcohol, Ethyl (B) <10 <10 mg/dL    Comment: (NOTE) Lowest detectable limit for serum alcohol is 10 mg/dL. For medical purposes only. Performed at [facility], [address] [place], [place] [address]   cbc     Status: None   Collection Time: 06/23/19 11:09 AM  Result Value Ref Range   WBC 9.4 4.0 - 10.5 K/uL   RBC 4.49 3.87 - 5.11 MIL/uL   Hemoglobin 12.5 12.0 - 15.0 g/dL   HCT 41.4  36.0 - 46.0 %   MCV 92.2 80.0 - 100.0 fL   MCH 27.8 26.0 - 34.0 pg   MCHC 30.2 30.0 - 36.0 g/dL   RDW 28.4 13.2 - 44.0 %   Platelets 365 150 - 400 K/uL   nRBC 0.0 0.0 - 0.2 %    Comment: Performed at [facility], [address], [place], [place] [address]  Acetaminophen level     Status: Abnormal   Collection Time: 06/23/19 11:29 AM  Result Value Ref Range   Acetaminophen (Tylenol), Serum <10 (L) 10 - 30 ug/mL    Comment: (NOTE) Therapeutic concentrations vary significantly. A range of 10-30 ug/mL  may be an effective concentration for many patients. However, some  are best treated at concentrations outside of this range. Acetaminophen concentrations >150 ug/mL at 4 hours after ingestion  and >50 ug/mL at 12 hours after ingestion are often associated with  toxic reactions. Performed at [facility], [address], [place], [place] [address]   Salicylate level     Status: None   Collection Time: 06/23/19 11:29 AM  Result Value Ref Range   Salicylate Lvl <7.0 2.8 - 30.0 mg/dL    Comment: Performed at [facility], [address], [place], [place] [address]  I-Stat beta hCG blood, ED     Status: None   Collection Time: 06/23/19 11:31 AM  Result Value Ref Range   I-stat hCG, quantitative <5.0 <5 mIU/mL   Comment 3            Comment:   GEST. AGE      CONC.  (mIU/mL)   <=1 WEEK        5 - 50     2 WEEKS       50 - 500     3 WEEKS       100 - 10,000      4 WEEKS     1,000 - 30,000        FEMALE AND NON-PREGNANT FEMALE:     LESS THAN 5 mIU/mL   Rapid urine drug screen (hospital performed)     Status: Abnormal   Collection Time: 06/23/19  4:09 PM  Result Value Ref Range   Opiates NONE DETECTED NONE DETECTED   Cocaine POSITIVE (A) NONE DETECTED   Benzodiazepines NONE DETECTED NONE DETECTED   Amphetamines NONE DETECTED NONE DETECTED   Tetrahydrocannabinol NONE DETECTED NONE DETECTED   Barbiturates NONE DETECTED NONE DETECTED    Comment: (NOTE) DRUG SCREEN FOR MEDICAL PURPOSES ONLY.  IF CONFIRMATION IS NEEDED FOR ANY PURPOSE, NOTIFY LAB WITHIN 5 DAYS. LOWEST DETECTABLE LIMITS FOR URINE DRUG SCREEN Drug Class                     Cutoff (ng/mL) Amphetamine and metabolites    1000 Barbiturate and metabolites    200 Benzodiazepine                 200 Tricyclics and metabolites     300 Opiates and metabolites        300 Cocaine and metabolites        300 THC                            50 Performed at [facility], [address], [place], [place] [address]   SARS Coronavirus 2 [facility] order, Performed in [facility] hospital lab)  Nasopharyngeal Nasopharyngeal Swab     Status: None   Collection Time: 06/23/19  5:37 PM   Specimen: Nasopharyngeal Swab  Result Value Ref Range   SARS Coronavirus 2 NEGATIVE NEGATIVE    Comment: (NOTE) If result is NEGATIVE SARS-CoV-2 target nucleic acids are NOT DETECTED. The SARS-CoV-2 RNA is generally detectable in upper and lower  respiratory specimens during the acute phase of infection. The lowest  concentration of SARS-CoV-2 viral copies this assay can detect is 250  copies / mL. A negative result does not preclude SARS-CoV-2 infection  and should not be used as the sole basis for treatment or other  patient management decisions.  A negative result may occur with  improper specimen collection / handling, submission of specimen other  than nasopharyngeal swab, presence of  viral mutation(s) within the  areas targeted by this assay, and inadequate number of viral copies  (<250 copies / mL). A negative result must be combined with clinical  observations, patient history, and epidemiological information. If result is POSITIVE SARS-CoV-2 target nucleic acids are DETECTED. The SARS-CoV-2 RNA is generally detectable in upper and lower  respiratory specimens dur ing the acute phase of infection.  Positive  results are indicative of active infection with SARS-CoV-2.  Clinical  correlation with patient history and other diagnostic information is  necessary to determine patient infection status.  Positive results do  not rule out bacterial infection or co-infection with other viruses. If result is PRESUMPTIVE POSTIVE SARS-CoV-2 nucleic acids MAY BE PRESENT.   A presumptive positive result was obtained on the submitted specimen  and confirmed on repeat testing.  While 2019 novel coronavirus  (SARS-CoV-2) nucleic acids may be present in the submitted sample  additional confirmatory testing may be necessary for epidemiological  and / or clinical management purposes  to differentiate between  SARS-CoV-2 and other Sarbecovirus currently known to infect humans.  If clinically indicated additional testing with an alternate test  methodology [phone]) is advised. The SARS-CoV-2 RNA is generally  detectable in upper and lower respiratory sp ecimens during the acute  phase of infection. The expected result is Negative. Fact Sheet for Patients:  BoilerBrush.com.cy Fact Sheet for Healthcare Providers: https://pope.com/ This test is not yet approved or cleared by the [place] FDA and has been authorized for detection and/or diagnosis of SARS-CoV-2 by FDA under an Emergency Use Authorization (EUA).  This EUA will remain in effect (meaning this test can be used) for the duration of the COVID-19 declaration under Section  564(b)(1) of the Act, 21 U.S.C. section 360bbb-3(b)(1), unless the authorization is terminated or revoked sooner. Performed at [facility], [address], [place], [place] [address]     Blood Alcohol level:  Lab Results  Component Value Date   [facility] <10 06/23/2019   ETH <10 05/16/2019    Metabolic Disorder Labs:  Lab Results  Component Value Date   HGBA1C 6.3 (H) 04/13/2019   MPG 134.11 04/13/2019   No results found for: PROLACTIN Lab Results  Component Value Date   CHOL 191 04/13/2019   TRIG 125 04/13/2019   HDL 64 04/13/2019   CHOLHDL 3.0 04/13/2019   VLDL 25 04/13/2019   LDLCALC 102 (H) 04/13/2019    Current Medications: Current Facility-Administered Medications  Medication Dose Route Frequency Provider Last Rate Last Dose  . acetaminophen (TYLENOL) tablet 650 mg  650 mg Oral Q6H PRN Patrcia Dolly, FNP      . alum & mag hydroxide-simeth (MAALOX/MYLANTA) 200-200-20 MG/5ML suspension  30 mL  30 mL Oral Q4H PRN Patrcia Dolly, FNP      . hydrOXYzine (ATARAX/VISTARIL) tablet 25 mg  25 mg Oral TID PRN Patrcia Dolly, FNP   25 mg at 06/24/19 2147  . loperamide (IMODIUM) capsule 2-4 mg  2-4 mg Oral PRN Patrcia Dolly, FNP      . magnesium hydroxide (MILK OF MAGNESIA) suspension 30 mL  30 mL Oral Daily PRN Patrcia Dolly, FNP      . ondansetron (ZOFRAN-ODT) disintegrating tablet 4 mg  4 mg Oral Q6H PRN Patrcia Dolly, FNP      . QUEtiapine (SEROQUEL) tablet 25 mg  25 mg Oral TID Malvin Johns, MD   25 mg at 06/24/19 1722  . traZODone (DESYREL) tablet 150 mg  150 mg Oral QHS PRN Malvin Johns, MD   150 mg at 06/24/19 2147   PTA Medications: Medications Prior to Admission  Medication Sig Dispense Refill Last Dose  . ARIPiprazole (ABILIFY) 5 MG tablet Take 1 tablet (5 mg total) by mouth daily. (Patient not taking: Reported on 06/23/2019) 90 tablet 1   . FLUoxetine (PROZAC) 20 MG capsule Take 1 capsule (20 mg total) by mouth daily. (Patient not taking: Reported on  06/23/2019) 90 capsule 1   . Prenat Vit-Fe Gly Cys-FA-Omega (ENBRACE HR) CAPS Take 1 capsule by mouth daily at 12 noon. (Patient not taking: Reported on 06/23/2019) 90 capsule 1   . traZODone (DESYREL) 100 MG tablet Take 1 tablet (100 mg total) by mouth at bedtime as needed for sleep. (Patient not taking: Reported on 06/23/2019) 90 tablet 1     Musculoskeletal: Strength & Muscle Tone: within normal limits Gait & Station: normal Patient leans: N/A  Psychiatric Specialty Exam: Physical Exam  ROS  Blood pressure (!) 120/108, pulse (!) 129, temperature 97.8 F (36.6 C), temperature source Oral, resp. rate 20, height 5\' 1"  (1.549 m), weight 89.8 kg, SpO2 98 %.Body mass index is 37.41 kg/m.  General Appearance: Casual  Eye Contact:  Poor  Speech:  Clear and Coherent  Volume:  Increased  Mood:  Angry and Irritable  Affect:  Congruent  Thought Process:  Coherent, Linear and Descriptions of Associations: Circumstantial  Orientation:  Full (Time, Place, and Person)  Thought Content:  Logical  Suicidal Thoughts:  No  Homicidal Thoughts:  No  Memory:  Immediate;   Good Recent;   Fair Remote;   Fair  Judgement:  Poor  Insight:  Shallow  Psychomotor Activity:  Normal  Concentration:  Concentration: Fair and Attention Span: Fair  Recall:  of Knowledge:  Fair  Language:  Fair  Akathisia:  Negative  Handed:  Right  AIMS (if indicated):     Assets:  Communication Skills Physical Health Resilience  ADL's:  Intact  Cognition:  WNL  Sleep:  Number of Hours: 10  Charted that she was not truly suicidal because I believe this is malingering  Treatment Plan Summary: Daily contact with patient to assess and evaluate symptoms and progress in treatment and Medication management  Observation Level/Precautions:  15 minute checks  Laboratory:  UDS  Psychotherapy: Instructed not to be rude to myself or staff/begin rehab based therapies  Medications: Begin fluoxetine and amantadine for  depression and cocaine cravings  Consultations: None necessary  Discharge Concerns: Housing and longer term sobriety  Estimated LOS: 2 to 3 days  Other: 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 binging Axis II issues of secondary gain and malingering  noted   Physician Treatment Plan for Primary Diagnosis: Reports multiple symptoms in the context of secondary gain issues and cocaine binging Long Term Goal(s): Improvement in symptoms so as ready for discharge  Short Term Goals: Ability to verbalize feelings will improve, Ability to disclose and discuss suicidal ideas, Ability to demonstrate self-control will improve and Ability to identify and develop effective coping behaviors will improve  Physician Treatment Plan for Secondary Diagnosis: Active Problems:   Cocaine abuse with cocaine-induced mood disorder (HCC)   MDD (major depressive disorder)  Long Term Goal(s): Improvement in symptoms so as ready for discharge  Short Term Goals: Ability to demonstrate self-control will improve, Ability to identify and develop effective coping behaviors will improve, Ability to maintain clinical measurements within normal limits will improve and Compliance with prescribed medications will improve  I certify that inpatient services furnished can reasonably be expected to improve the patient's condition.    Malvin Johns, MD 9/22/20207:46 AM

## 2019-06-25 NOTE — Progress Notes (Signed)
D: Pt  passive SI / AH- contracts for safety.denies HI/VH. Pt is pleasant and cooperative. Pt  A: Pt was offered support and encouragement. Pt was given scheduled medications. Pt was encourage to attend groups. Q 15 minute checks were done for safety.  R:Pt attends groups and interacts well with peers and staff. Pt is taking medication. Pt has no complaints.Pt receptive to treatment and safety maintained on unit.  Problem: Medication: Goal: Compliance with prescribed medication regimen will improve Outcome: Progressing   Problem: Self-Concept: Goal: Will verbalize positive feelings about self Outcome: Progressing   Problem: Coping: Goal: Coping ability will improve Outcome: Progressing

## 2019-06-25 NOTE — Progress Notes (Signed)
Recreation Therapy Notes  Date: 9.22.20 Time: 1000 Location: 500 Hall Dayroom  Group Topic: Communication  Goal Area(s) Addresses:  Patient will effectively communicate with peers in group.  Patient will verbalize benefit of healthy communication. Patient will verbalize positive effect of healthy communication on post d/c goals.  Patient will identify communication techniques that made activity effective for group.   Intervention:  Paper, pencils  Activity:  Geometrical Drawings.  Patients were given the chance to describe a picture to their peers.  Each patient that described a picture could be as detailed as possible.  The remaining patients could only ask for the person describing the drawing to repeat themselves.  Education: Communication, Discharge Planning  Education Outcome: Acknowledges understanding/In group clarification offered/Needs additional education.   Clinical Observations/Feedback:  Pt did not attend group.    Avyukt Cimo, LRT/CTRS         Eion Timbrook A 06/25/2019 11:05 AM 

## 2019-06-25 NOTE — Progress Notes (Signed)
DAR NOTE: Patient presents with irritable affect and depressed mood.  Verbally abusive towards [name] staff during lunch.  Report suicidal thoughts, auditory and visual hallucinations but contracts for safety.  Described energy level as low and concentration as poor.  Rates depression at 10, hopelessness at 10, and anxiety at 10.  Maintained on routine safety checks.  Medications given as prescribed.  Support and encouragement offered as needed.   Patient stayed in her room for majority of this shift sleeping.  Patient is safe on and off the unit.

## 2019-06-25 NOTE — Progress Notes (Signed)
CSW attempted to meet with patient to complete PSA. Patient was sleeping soundly, she did not respond to CSW knocking at her door or calling her name.  CSW will attempt to follow up at a later time.  Stephanie Acre, LCSW-A Clinical Social Worker

## 2019-06-25 NOTE — Progress Notes (Signed)
Recreation Therapy Notes  INPATIENT RECREATION THERAPY ASSESSMENT  Patient Details Name: Jordan Walton MRN: [id number] DOB: Jan 25, 1991 Today's Date: 06/25/2019       Information Obtained From: Patient  Able to Participate in Assessment/Interview: Yes  Patient Presentation: (Drowsy)  Reason for Admission (Per Patient): (Depression; Anxiety)  Patient Stressors: Other (Comment)(Drugs; Bad influences)  Coping Skills:   Self-Injury, Music, Exercise, Meditate, Substance Abuse, Impulsivity, Talk, Art, Prayer, Read, Hot Bath/Shower  Leisure Interests (2+):  Art - Draw, Music - Listen, Individual - Other (Comment)(Pray)  Frequency of Recreation/Participation: Other (Comment)(Daily)  Awareness of Community Resources:  Yes  Community Resources:  Patent examiner, Other (Comment)(Art museum; Stores)  Current Use: Yes  If no, Barriers?:    Expressed Interest in [employer] Information: No  [employer] of Residence:  [place]  Patient Main Form of Transportation: Walk  Patient Strengths:  Walking; Talk positive  Patient Identified Areas of Improvement:  Stability; Attitude  Patient Goal for Hospitalization:  "find stability"  Current SI (including self-harm):  Yes(Rated 7 out of 10; Contracts for safety)  Current HI:  No  Current AVH: Yes(Hearing voices- don't know what they are saying; Seeing dots)  Staff Intervention Plan: Group Attendance, Collaborate with Interdisciplinary Treatment Team  Consent to Intern Participation: N/A     Victorino Sparrow, LRT/CTRS  Ria Comment, Lukis Bunt A 06/25/2019, 11:26 AM
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
## 2019-06-26 NOTE — Progress Notes (Signed)
D: Pt SI / AVH. Pt stated she still having issues. Pt encouraged to talk to doctor A: Pt was offered support and encouragement. Pt was given scheduled medications. Pt was encourage to attend groups. Q 15 minute checks were done for safety.  R: safety maintained on unit.

## 2019-06-26 NOTE — Progress Notes (Signed)
[facility] MD Progress Note  06/26/2019 11:05 AM Jordan Walton  MRN:  [id number] Subjective:   Patient continues to seek rehab she is more polite to staff she is alert and oriented to person place situation and denies current thoughts of harming self thoughts of harming others.  Overall able to contract but housing is an issue, no cravings tremors or withdrawal Principal Problem: Homelessness substance abuse substance-induced mood disorder Diagnosis: Active Problems:   Cocaine abuse with cocaine-induced mood disorder (HCC)   MDD (major depressive disorder)  Total Time spent with patient: 20 minutes  Past Psychiatric History: Prior similar presentations  Past Medical History:  Past Medical History:  Diagnosis Date  . Crack cocaine use    History reviewed. No pertinent surgical history. Family History: History reviewed. No pertinent family history. Family Psychiatric  History: No new data shared Social History:  Social History   Substance and Sexual Activity  Alcohol Use Yes  . Alcohol/week: 6.0 - 12.0 standard drinks  . Types: 6 - 12 Cans of beer per week     Social History   Substance and Sexual Activity  Drug Use Yes  . Frequency: 7.0 times per week  . Types: "Crack" cocaine   Comment: Daily use -- up to a gram    Social History   Socioeconomic History  . Marital status: Single    Spouse name: Not on file  . Number of children: Not on file  . Years of education: Not on file  . Highest education level: Not on file  Occupational History  . Occupation: Unemployed  Social Needs  . Financial resource strain: Not on file  . Food insecurity    Worry: Not on file    Inability: Not on file  . Transportation needs    Medical: Not on file    Non-medical: Not on file  Tobacco Use  . Smoking status: Current Every Day Smoker    Types: Cigarettes  . Smokeless tobacco: Never Used  Substance and Sexual Activity  . Alcohol use: Yes    Alcohol/week: 6.0 - 12.0 standard  drinks    Types: 6 - 12 Cans of beer per week  . Drug use: Yes    Frequency: 7.0 times per week    Types: "Crack" cocaine    Comment: Daily use -- up to a gram  . Sexual activity: Yes  Lifestyle  . Physical activity    Days per week: Not on file    Minutes per session: Not on file  . Stress: Not on file  Relationships  . Social Herbalist on phone: Not on file    Gets together: Not on file    Attends religious service: Not on file    Active member of club or organization: Not on file    Attends meetings of clubs or organizations: Not on file    Relationship status: Not on file  Other Topics Concern  . Not on file  Social History Narrative   Client lives in [place]; unemployed; not actively seeking under care of psychiatrist, recently stopped attending [facility].   Additional Social History:  Specify valuables returned: See belonging sheet                      Sleep: Good  Appetite:  Good  Current Medications: Current Facility-Administered Medications  Medication Dose Route Frequency Provider Last Rate Last Dose  . acetaminophen (TYLENOL) tablet 650 mg  650 mg Oral Q6H PRN  Patrcia Dolly, FNP   650 mg at 06/26/19 0751  . alum & mag hydroxide-simeth (MAALOX/MYLANTA) 200-200-20 MG/5ML suspension 30 mL  30 mL Oral Q4H PRN Patrcia Dolly, FNP      . amantadine (SYMMETREL) capsule 100 mg  100 mg Oral BID Malvin Johns, MD   100 mg at 06/26/19 0749  . FLUoxetine (PROZAC) capsule 20 mg  20 mg Oral Daily Malvin Johns, MD   20 mg at 06/26/19 0749  . hydrOXYzine (ATARAX/VISTARIL) tablet 25 mg  25 mg Oral TID PRN Patrcia Dolly, FNP   25 mg at 06/26/19 4492  . loperamide (IMODIUM) capsule 2-4 mg  2-4 mg Oral PRN Patrcia Dolly, FNP   2 mg at 06/26/19 0751  . magnesium hydroxide (MILK OF MAGNESIA) suspension 30 mL  30 mL Oral Daily PRN Patrcia Dolly, FNP      . ondansetron (ZOFRAN-ODT) disintegrating tablet 4 mg  4 mg Oral Q6H PRN Patrcia Dolly, FNP      . QUEtiapine  (SEROQUEL) tablet 25 mg  25 mg Oral TID Malvin Johns, MD   25 mg at 06/26/19 0749  . traZODone (DESYREL) tablet 150 mg  150 mg Oral QHS PRN Malvin Johns, MD   150 mg at 06/26/19 0100    Lab Results: No results found for this or any previous visit (from the past 48 hour(s)).  Blood Alcohol level:  Lab Results  Component Value Date   ETH <10 06/23/2019   ETH <10 05/16/2019    Metabolic Disorder Labs: Lab Results  Component Value Date   HGBA1C 6.3 (H) 04/13/2019   MPG 134.11 04/13/2019   No results found for: PROLACTIN Lab Results  Component Value Date   CHOL 191 04/13/2019   TRIG 125 04/13/2019   HDL 64 04/13/2019   CHOLHDL 3.0 04/13/2019   VLDL 25 04/13/2019   LDLCALC 102 (H) 04/13/2019    Physical Findings: AIMS: Facial and Oral Movements Muscles of Facial Expression: None, normal Lips and Perioral Area: None, normal Jaw: None, normal Tongue: None, normal,Extremity Movements Upper (arms, wrists, hands, fingers): None, normal Lower (legs, knees, ankles, toes): None, normal, Trunk Movements Neck, shoulders, hips: None, normal, Overall Severity Severity of abnormal movements (highest score from questions above): None, normal Incapacitation due to abnormal movements: None, normal Patient's awareness of abnormal movements (rate only patient's report): No Awareness, Dental Status Current problems with teeth and/or dentures?: No Does patient usually wear dentures?: No  CIWA:  CIWA-Ar Total: 0 COWS:     Musculoskeletal: Strength & Muscle Tone: within normal limits Gait & Station: normal Patient leans: N/A  Psychiatric Specialty Exam: Physical Exam  ROS  Blood pressure (!) 133/93, pulse (!) 105, temperature 97.7 F (36.5 C), temperature source Oral, resp. rate 20, height 5\' 1"  (1.549 m), weight 89.8 kg, SpO2 98 %.Body mass index is 37.41 kg/m.  General Appearance: Casual  Eye Contact:  Good  Speech:  Clear and Coherent  Volume:  Normal  Mood:  Euthymic and And  cordial and not irritable today  Affect:  Congruent  Thought Process:  Goal Directed  Orientation:  Full (Time, Place, and Person)  Thought Content:  Logical  Suicidal Thoughts:  No  Homicidal Thoughts:  No  Memory:  Immediate;   Fair Recent;   Fair Remote;   Fair  Judgement:  Good  Insight:  Good  Psychomotor Activity:  Normal  Concentration:  Concentration: Fair and Attention Span: Fair  Recall:  Good  Fund of  Knowledge:  Good  Language:  Fair  Akathisia:  Negative  Handed:  Right  AIMS (if indicated):     Assets:  Leisure Time Physical Health  ADL's:  Intact  Cognition:  WNL  Sleep:  Number of Hours: 8.75     Treatment Plan Summary: Daily contact with patient to assess and evaluate symptoms and progress in treatment and Medication management  No change in current meds or precautions hold off on discharge in the day to secure housing continue cognitive therapy and current med dosing  Shelita Steptoe, MD 06/26/2019, 11:05 AM

## 2019-06-26 NOTE — Progress Notes (Signed)
D: Pt SI/ AVH- contract for safety. Pt stayed in room this evening.  A: Pt was offered support and encouragement. Pt was encourage to attend groups. Q 15 minute checks were done for safety.  R: safety maintained on unit.  Problem: Medication: Goal: Compliance with prescribed medication regimen will improve Outcome: Progressing   Problem: Self-Concept: Goal: Ability to disclose and discuss suicidal ideas will improve Outcome: Progressing

## 2019-06-26 NOTE — Tx Team (Signed)
Interdisciplinary Treatment and Diagnostic Plan Update  06/26/2019 Time of Session: 10:05am Jordan Walton MRN: [id number]  Principal Diagnosis: <principal problem not specified>  Secondary Diagnoses: Active Problems:   Cocaine abuse with cocaine-induced mood disorder (HCC)   MDD (major depressive disorder)   Current Medications:  Current Facility-Administered Medications  Medication Dose Route Frequency Provider Last Rate Last Dose  . acetaminophen (TYLENOL) tablet 650 mg  650 mg Oral Q6H PRN Emmaline Kluver, FNP   650 mg at 06/26/19 0751  . alum & mag hydroxide-simeth (MAALOX/MYLANTA) 200-200-20 MG/5ML suspension 30 mL  30 mL Oral Q4H PRN Emmaline Kluver, FNP      . amantadine (SYMMETREL) capsule 100 mg  100 mg Oral BID Johnn Hai, MD   100 mg at 06/26/19 0749  . FLUoxetine (PROZAC) capsule 20 mg  20 mg Oral Daily Johnn Hai, MD   20 mg at 06/26/19 0749  . hydrOXYzine (ATARAX/VISTARIL) tablet 25 mg  25 mg Oral TID PRN Emmaline Kluver, FNP   25 mg at 06/26/19 0254  . loperamide (IMODIUM) capsule 2-4 mg  2-4 mg Oral PRN Emmaline Kluver, FNP   2 mg at 06/26/19 0751  . magnesium hydroxide (MILK OF MAGNESIA) suspension 30 mL  30 mL Oral Daily PRN Emmaline Kluver, FNP      . ondansetron (ZOFRAN-ODT) disintegrating tablet 4 mg  4 mg Oral Q6H PRN Emmaline Kluver, FNP      . QUEtiapine (SEROQUEL) tablet 25 mg  25 mg Oral TID Johnn Hai, MD   25 mg at 06/26/19 1229  . traZODone (DESYREL) tablet 150 mg  150 mg Oral QHS PRN Johnn Hai, MD   150 mg at 06/26/19 0213   PTA Medications: Medications Prior to Admission  Medication Sig Dispense Refill Last Dose  . ARIPiprazole (ABILIFY) 5 MG tablet Take 1 tablet (5 mg total) by mouth daily. (Patient not taking: Reported on 06/23/2019) 90 tablet 1   . FLUoxetine (PROZAC) 20 MG capsule Take 1 capsule (20 mg total) by mouth daily. (Patient not taking: Reported on 06/23/2019) 90 capsule 1   . Prenat Vit-Fe Gly Cys-FA-Omega (ENBRACE HR) CAPS Take 1 capsule by  mouth daily at 12 noon. (Patient not taking: Reported on 06/23/2019) 90 capsule 1   . traZODone (DESYREL) 100 MG tablet Take 1 tablet (100 mg total) by mouth at bedtime as needed for sleep. (Patient not taking: Reported on 06/23/2019) 90 tablet 1     Patient Stressors: Financial difficulties Substance abuse  Patient Strengths: Capable of independent living Communication skills  Treatment Modalities: Medication Management, Group therapy, Case management,  1 to 1 session with clinician, Psychoeducation, Recreational therapy.   Physician Treatment Plan for Primary Diagnosis: <principal problem not specified> Long Term Goal(s): Improvement in symptoms so as ready for discharge Improvement in symptoms so as ready for discharge   Short Term Goals: Ability to verbalize feelings will improve Ability to disclose and discuss suicidal ideas Ability to demonstrate self-control will improve Ability to identify and develop effective coping behaviors will improve Ability to demonstrate self-control will improve Ability to identify and develop effective coping behaviors will improve Ability to maintain clinical measurements within normal limits will improve Compliance with prescribed medications will improve  Medication Management: Evaluate patient's response, side effects, and tolerance of medication regimen.  Therapeutic Interventions: 1 to 1 sessions, Unit Group sessions and Medication administration.  Evaluation of Outcomes: Progressing  Physician Treatment Plan for Secondary Diagnosis: Active Problems:   Cocaine abuse with  cocaine-induced mood disorder (HCC)   MDD (major depressive disorder)  Long Term Goal(s): Improvement in symptoms so as ready for discharge Improvement in symptoms so as ready for discharge   Short Term Goals: Ability to verbalize feelings will improve Ability to disclose and discuss suicidal ideas Ability to demonstrate self-control will improve Ability to identify  and develop effective coping behaviors will improve Ability to demonstrate self-control will improve Ability to identify and develop effective coping behaviors will improve Ability to maintain clinical measurements within normal limits will improve Compliance with prescribed medications will improve     Medication Management: Evaluate patient's response, side effects, and tolerance of medication regimen.  Therapeutic Interventions: 1 to 1 sessions, Unit Group sessions and Medication administration.  Evaluation of Outcomes: Progressing   RN Treatment Plan for Primary Diagnosis: <principal problem not specified> Long Term Goal(s): Knowledge of disease and therapeutic regimen to maintain health will improve  Short Term Goals: Ability to participate in decision making will improve, Ability to verbalize feelings will improve, Ability to disclose and discuss suicidal ideas, Ability to identify and develop effective coping behaviors will improve and Compliance with prescribed medications will improve  Medication Management: RN will administer medications as ordered by provider, will assess and evaluate patient's response and provide education to patient for prescribed medication. RN will report any adverse and/or side effects to prescribing provider.  Therapeutic Interventions: 1 on 1 counseling sessions, Psychoeducation, Medication administration, Evaluate responses to treatment, Monitor vital signs and CBGs as ordered, Perform/monitor CIWA, COWS, AIMS and Fall Risk screenings as ordered, Perform wound care treatments as ordered.  Evaluation of Outcomes: Progressing   LCSW Treatment Plan for Primary Diagnosis: <principal problem not specified> Long Term Goal(s): Safe transition to appropriate next level of care at discharge, Engage patient in therapeutic group addressing interpersonal concerns.  Short Term Goals: Engage patient in aftercare planning with referrals and resources and Increase  skills for wellness and recovery  Therapeutic Interventions: Assess for all discharge needs, 1 to 1 time with Social worker, Explore available resources and support systems, Assess for adequacy in community support network, Educate family and significant other(s) on suicide prevention, Complete Psychosocial Assessment, Interpersonal group therapy.  Evaluation of Outcomes: Progressing   Progress in Treatment: Attending groups: No. Participating in groups: No. Taking medication as prescribed: Yes. Toleration medication: Yes. Family/Significant other contact made: No, will contact:  pt's father Patient understands diagnosis: Yes. Discussing patient identified problems/goals with staff: Yes. Medical problems stabilized or resolved: Yes. Denies suicidal/homicidal ideation: Yes. Issues/concerns per patient self-inventory: No. Other:   New problem(s) identified: No, Describe:  None  New Short Term/Long Term Goal(s): Medication stabilization, elimination of SI thoughts, and development of a comprehensive mental wellness plan.   Patient Goals:  "Gain focus back and gain motor skills back"  Discharge Plan or Barriers: CSW will continue to follow up for appropriate referrals and possible discharge planning  Reason for Continuation of Hospitalization: Depression Medication stabilization  Estimated Length of Stay: 1-2 days   Attendees: Patient: Jordan Walton  06/26/2019   Physician: Dr. Malvin Johns, MD 06/26/2019   Nursing: Ethelene Browns, RN 06/26/2019   RN Care Manager: 06/26/2019   Social Worker: Stephannie Peters, LCSW 06/26/2019   Recreational Therapist:  06/26/2019   MSW Intern: Earlyne Iba 06/26/2019   Other:  06/26/2019   Other: 06/26/2019      Scribe for Treatment Team: Delphia Grates, LCSW 06/26/2019 12:35 PM

## 2019-06-26 NOTE — Progress Notes (Signed)
Adult Psychoeducational Group Note  Date:  06/26/2019 Time:  1:32 AM  Group Topic/Focus:  Wrap-Up Group:   The focus of this group is to help patients review their daily goal of treatment and discuss progress on daily workbooks.  Participation Level:  Did Not Attend  Participation Quality:  Did Not Attend  Affect:  Did Not Attend  Cognitive:  Did Not Attend  Insight: None  Engagement in Group:  Did Not Attend  Modes of Intervention:  Did Not Attend  Additional Comments:  Pt did not attend evening wrap up group.  Candy Sledge 06/26/2019, 1:32 AM

## 2019-06-26 NOTE — Progress Notes (Signed)
Recreation Therapy Notes  Date: 9.23.20 Time: 1000 Location: [address] Day Room  Group Topic: Anger Management  Goal Area(s) Addresses:  Patient will identify triggers for anger.  Patient will identify a situation that makes them angry.  Patient will identify what other emotions comes with anger.   Intervention:  Worksheet  Activity: Intro to Anger Management.  Patients were to identify any person, situation or topic leads to their anger.  Patients then identified what actions they express when angry, problems they have encountered because of anger and identify 5 positive coping skills they could use when angry.  Education: Anger Management, Discharge Planning   Education Outcome: Acknowledges education/In group clarification offered/Needs additional education.   Clinical Observations/Feedback:  Pt did not attend group.    Victorino Sparrow, LRT/CTRS         Ria Comment, Xoie Kreuser A 06/26/2019 11:03 AM

## 2019-06-26 NOTE — Care Management (Signed)
CMA sent referral to [facility] will follow up.    CMA will notify CSW, Ardelle Anton.      Linder Prajapati Care Management Assistant  Email:[email or web address] Office: [phone]

## 2019-06-26 NOTE — Progress Notes (Signed)
DAR NOTE: Patient presents with anxious affect and depressed mood.  Denies suicidal thoughts, auditory and visual hallucinations.  Rates depression at 10, hopelessness at 10, and anxiety at 10.  Maintained on routine safety checks.  Medications given as prescribed.  Support and encouragement offered as needed.    States goal for today is "placement."  Patient is withdrawn and isolates to her room.  Minimal interaction with staff and peers.  Patient is safe on and off the unit.

## 2019-06-27 ENCOUNTER — Other Ambulatory Visit: Payer: Self-pay

## 2019-06-27 ENCOUNTER — Emergency Department (HOSPITAL_COMMUNITY)
Admission: EM | Admit: 2019-06-27 | Discharge: 2019-06-28 | Disposition: A | Discharge status: Home / Self Care | Payer: Self-pay | Attending: Emergency Medicine | Admitting: Emergency Medicine

## 2019-06-27 ENCOUNTER — Encounter (HOSPITAL_COMMUNITY): Payer: Self-pay | Admitting: Emergency Medicine

## 2019-06-27 DIAGNOSIS — F329 Major depressive disorder, single episode, unspecified: Secondary | ICD-10-CM | POA: Insufficient documentation

## 2019-06-27 DIAGNOSIS — R45851 Suicidal ideations: Secondary | ICD-10-CM | POA: Insufficient documentation

## 2019-06-27 DIAGNOSIS — L0291 Cutaneous abscess, unspecified: Secondary | ICD-10-CM

## 2019-06-27 DIAGNOSIS — Z23 Encounter for immunization: Secondary | ICD-10-CM | POA: Insufficient documentation

## 2019-06-27 DIAGNOSIS — Z20828 Contact with and (suspected) exposure to other viral communicable diseases: Secondary | ICD-10-CM | POA: Insufficient documentation

## 2019-06-27 DIAGNOSIS — L02415 Cutaneous abscess of right lower limb: Secondary | ICD-10-CM | POA: Insufficient documentation

## 2019-06-27 DIAGNOSIS — L02416 Cutaneous abscess of left lower limb: Secondary | ICD-10-CM | POA: Insufficient documentation

## 2019-06-27 LAB — CBC WITH DIFFERENTIAL/PLATELET
Abs Immature Granulocytes: 0.04 10*3/uL (ref 0.00–0.07)
Basophils Absolute: 0.1 10*3/uL (ref 0.0–0.1)
Basophils Relative: 0 %
Eosinophils Absolute: 0.2 10*3/uL (ref 0.0–0.5)
Eosinophils Relative: 2 %
HCT: 45 % (ref 36.0–46.0)
Hemoglobin: 13.5 g/dL (ref 12.0–15.0)
Immature Granulocytes: 0 %
Lymphocytes Relative: 35 %
Lymphs Abs: 4.1 10*3/uL — ABNORMAL HIGH (ref 0.7–4.0)
MCH: 27.8 pg (ref 26.0–34.0)
MCHC: 30 g/dL (ref 30.0–36.0)
MCV: 92.8 fL (ref 80.0–100.0)
Monocytes Absolute: 0.6 10*3/uL (ref 0.1–1.0)
Monocytes Relative: 5 %
Neutro Abs: 6.7 10*3/uL (ref 1.7–7.7)
Neutrophils Relative %: 58 %
Platelets: 364 10*3/uL (ref 150–400)
RBC: 4.85 MIL/uL (ref 3.87–5.11)
RDW: 14.7 % (ref 11.5–15.5)
WBC: 11.6 10*3/uL — ABNORMAL HIGH (ref 4.0–10.5)
nRBC: 0 % (ref 0.0–0.2)

## 2019-06-27 LAB — COMPREHENSIVE METABOLIC PANEL
ALT: 15 U/L (ref 0–44)
AST: 16 U/L (ref 15–41)
Albumin: 3.7 g/dL (ref 3.5–5.0)
Alkaline Phosphatase: 56 U/L (ref 38–126)
Anion gap: 7 (ref 5–15)
BUN: 13 mg/dL (ref 6–20)
CO2: 24 mmol/L (ref 22–32)
Calcium: 8.8 mg/dL — ABNORMAL LOW (ref 8.9–10.3)
Chloride: 104 mmol/L (ref 98–111)
Creatinine, Ser: 0.96 mg/dL (ref 0.44–1.00)
GFR calc Af Amer: >60 mL/min (ref >60)
GFR calc non Af Amer: >60 mL/min (ref >60)
Glucose, Bld: 125 mg/dL — ABNORMAL HIGH (ref 70–99)
Potassium: 4.2 mmol/L (ref 3.5–5.1)
Sodium: 135 mmol/L (ref 135–145)
Total Bilirubin: 0.3 mg/dL (ref 0.3–1.2)
Total Protein: 7.3 g/dL (ref 6.5–8.1)

## 2019-06-27 LAB — ETHANOL: Alcohol, Ethyl (B): <10 mg/dL (ref <10)

## 2019-06-27 LAB — I-STAT BETA HCG BLOOD, ED (MC, WL, AP ONLY): I-stat hCG, quantitative: <5 m[IU]/mL (ref <5)

## 2019-06-27 LAB — RAPID URINE DRUG SCREEN, HOSP PERFORMED
Amphetamines: NOT DETECTED
Barbiturates: NOT DETECTED
Benzodiazepines: NOT DETECTED
Cocaine: NOT DETECTED
Opiates: NOT DETECTED
Tetrahydrocannabinol: NOT DETECTED

## 2019-06-27 MED ORDER — FLUOXETINE HCL 20 MG PO CAPS
20.0000 mg | ORAL_CAPSULE | Freq: Every day | ORAL | Status: DC
  Administered 2019-06-28: 20 mg via ORAL
  Filled 2019-06-27: qty 1

## 2019-06-27 MED ORDER — AMANTADINE HCL 100 MG PO CAPS: 100.0000 mg | ORAL_CAPSULE | Freq: Three times a day (TID) | ORAL | 1 refills | Status: DC

## 2019-06-27 MED ORDER — QUETIAPINE FUMARATE 25 MG PO TABS: 25.0000 mg | ORAL_TABLET | Freq: Three times a day (TID) | ORAL | 1 refills | Status: DC

## 2019-06-27 MED ORDER — DOXYCYCLINE HYCLATE 100 MG PO TABS
100.0000 mg | ORAL_TABLET | Freq: Two times a day (BID) | ORAL | Status: DC
  Administered 2019-06-28: 03:00:00 100 mg via ORAL
  Filled 2019-06-27: qty 1

## 2019-06-27 MED ORDER — FLUOXETINE HCL 20 MG PO CAPS: 20.0000 mg | ORAL_CAPSULE | Freq: Every day | ORAL | 1 refills | Status: DC

## 2019-06-27 MED ORDER — AMANTADINE HCL 100 MG PO CAPS
100.0000 mg | ORAL_CAPSULE | Freq: Three times a day (TID) | ORAL | Status: DC
  Administered 2019-06-28: 03:00:00 100 mg via ORAL
  Filled 2019-06-27: qty 1

## 2019-06-27 MED ORDER — AMANTADINE HCL 100 MG PO CAPS: 100.0000 mg | ORAL_CAPSULE | Freq: Three times a day (TID) | ORAL | Status: DC

## 2019-06-27 MED ORDER — ACETAMINOPHEN 325 MG PO TABS: 650.0000 mg | ORAL_TABLET | ORAL | Status: DC | PRN

## 2019-06-27 MED ORDER — DOXYCYCLINE HYCLATE 100 MG PO TABS: 100.0000 mg | ORAL_TABLET | Freq: Once | ORAL | Status: DC

## 2019-06-27 MED ORDER — TETANUS-DIPHTH-ACELL PERTUSSIS 5-2.5-18.5 LF-MCG/0.5 IM SUSP
0.5000 mL | Freq: Once | INTRAMUSCULAR | Status: AC
  Administered 2019-06-27: 20:00:00 0.5 mL via INTRAMUSCULAR
  Filled 2019-06-27: qty 0.5

## 2019-06-27 MED ORDER — TRAZODONE HCL 100 MG PO TABS
100.0000 mg | ORAL_TABLET | Freq: Every evening | ORAL | Status: DC | PRN
  Filled 2019-06-27 (×2): qty 4

## 2019-06-27 NOTE — Care Management (Signed)
CMA sent referral to [facility].   CMA will follow up.   CMA has notified CSW, [place].   Cecilie Heidel Care Management Assistant  Email:[email or web address] Office: [phone]

## 2019-06-27 NOTE — Care Management (Signed)
CMA spoke with Jordan Walton at [facility]. Patient has been declined for treatment. Per Jordan Walton, facility can not help patient with her needs at this time.    CMA has notified CSW, Ardelle Anton.     Lamaria Hildebrandt Care Management Assistant  Email:[email or web address] Office: [phone]

## 2019-06-27 NOTE — Progress Notes (Signed)
Recreation Therapy Notes  Date: 9.24.20 Time: 0945 Location: 500 Hall Dayroom   Group Topic: Communication, Team Building, Problem Solving  Goal Area(s) Addresses:  Patient will effectively work with peer towards shared goal.  Patient will identify skill used to make activity successful.  Patient will identify how skills used during activity can be used to reach post d/c goals.   Intervention: STEM Activity   Activity: [employer]. Patients were provided the following materials: 5 drinking straws, 5 rubber bands, 5 paper clips, 2 index cards and 2 drinking cups. Using the provided materials patients were asked to build a launching mechanisms to launch a ping pong ball approximately 12 feet. Patients were divided into teams of 3-5.   Education: Education officer, community, Dentist.   Education Outcome: Acknowledges education/In group clarification offered/Needs additional education.   Clinical Observations/Feedback:  Pt did not attend group.    Victorino Sparrow, LRT/CTRS         Victorino Sparrow A 06/27/2019 10:55 AM

## 2019-06-27 NOTE — Progress Notes (Signed)
Patient ID: Jordan Walton, female   DOB: [birth year], 28 y.o.   MRN: [id number]   CSW contacted pt's father for an update on housing options for the patient since she is discharging today.  Pt's father stated that he called [facility] and they denied her (Social Work Environmental consultant at [facility] confirmed). Pt's father stated that he contacted Shayla at [facility] told him for [facility] to send over information for possible admissions. CSW let Social Work Environmental consultant know and fax was submitted over to [employer].

## 2019-06-27 NOTE — Progress Notes (Signed)
D:  Patient decided she did not want to leave [facility].  Patient talked to MD, SW, and staff about her problems. A:  Medications administered per MD orders.  Emotional support and encouragement given patient. R:  Safety maintained with 15 minute checks.

## 2019-06-27 NOTE — ED Triage Notes (Signed)
Patient reports SI with plan to jump off of a bridge. Reports d/c from [facility] today. Also c/o wounds to lower legs with pain. No drainage noted.

## 2019-06-27 NOTE — Discharge Summary (Signed)
Physician Discharge Summary Note  Patient:  Jordan Walton is an 28 y.o., female MRN:  [id number] DOB:  04-30-1991 Patient phone:  [phone] (home)  Patient address:   [place] [address],  Total Time spent with patient: 45 minutes  Date of Admission:  06/24/2019 Date of Discharge: 06/27/2019  Reason for Admission:    As discussed in the HPI this was a repeat admission for Jordan Walton 28 year old homeless individual who was abusing crack cocaine and binging on it.  Her admission was really prompted by issues of secondary gain and homelessness she had hoped to find some rehab prior to discharge.  Initially she was irritable and poorly cooperative and I informed her that if she was verbally abusive to the staff she would be discharged immediately as I did not believe she was genuinely suicidal, and to her credit she became cordial throughout the rest of her stay  Principal Problem: Homelessness substance abuse issues of secondary gain history of depression Discharge Diagnoses: Active Problems:   Cocaine abuse with cocaine-induced mood disorder (HCC)   MDD (major depressive disorder)   Past Psychiatric History: Last here in July with a similar presentation  Past Medical History:  Past Medical History:  Diagnosis Date  . Crack cocaine use    History reviewed. No pertinent surgical history. Family History: History reviewed. No pertinent family history. Family Psychiatric  History: no new data Social History:  Social History   Substance and Sexual Activity  Alcohol Use Yes  . Alcohol/week: 6.0 - 12.0 standard drinks  . Types: 6 - 12 Cans of beer per week     Social History   Substance and Sexual Activity  Drug Use Yes  . Frequency: 7.0 times per week  . Types: "Crack" cocaine   Comment: Daily use -- up to a gram    Social History   Socioeconomic History  . Marital status: Single    Spouse name: Not on file  . Number of children: Not on file  . Years of  education: Not on file  . Highest education level: Not on file  Occupational History  . Occupation: Unemployed  Social Needs  . Financial resource strain: Not on file  . Food insecurity    Worry: Not on file    Inability: Not on file  . Transportation needs    Medical: Not on file    Non-medical: Not on file  Tobacco Use  . Smoking status: Current Every Day Smoker    Types: Cigarettes  . Smokeless tobacco: Never Used  Substance and Sexual Activity  . Alcohol use: Yes    Alcohol/week: 6.0 - 12.0 standard drinks    Types: 6 - 12 Cans of beer per week  . Drug use: Yes    Frequency: 7.0 times per week    Types: "Crack" cocaine    Comment: Daily use -- up to a gram  . Sexual activity: Yes  Lifestyle  . Physical activity    Days per week: Not on file    Minutes per session: Not on file  . Stress: Not on file  Relationships  . Social Herbalist on phone: Not on file    Gets together: Not on file    Attends religious service: Not on file    Active member of club or organization: Not on file    Attends meetings of clubs or organizations: Not on file    Relationship status: Not on file  Other Topics Concern  .  Not on file  Social History Narrative   Client lives in [place]; unemployed; not actively seeking under care of psychiatrist, recently stopped attending [facility].    Hospital Course:   As discussed initially the patient was verbally abusing and insisting she would does not go to be forced to leave the hospital at any particular time so forth but she became more cordial and cooperative when she was informed that she would be discharged for verbal abusiveness.  She had no cocaine cravings but was given amantadine to help prevent them she was continued on fluoxetine at the point of discharge.  She was given quetiapine to help stabilize mood.  By the date of the 24th she had some rehab options he was discharged home she had no thoughts of harming self or others could  contract fully and again no cravings tremors or withdrawal symptoms  Physical Findings: AIMS: Facial and Oral Movements Muscles of Facial Expression: None, normal Lips and Perioral Area: None, normal Jaw: None, normal Tongue: None, normal,Extremity Movements Upper (arms, wrists, hands, fingers): None, normal Lower (legs, knees, ankles, toes): None, normal, Trunk Movements Neck, shoulders, hips: None, normal, Overall Severity Severity of abnormal movements (highest score from questions above): None, normal Incapacitation due to abnormal movements: None, normal Patient's awareness of abnormal movements (rate only patient's report): No Awareness, Dental Status Current problems with teeth and/or dentures?: No Does patient usually wear dentures?: No  CIWA:  CIWA-Ar Total: 2 COWS:     Musculoskeletal: Strength & Muscle Tone: within normal limits Gait & Station: normal Patient leans: N/A  Psychiatric Specialty Exam: Physical Exam  ROS  Blood pressure 116/72, pulse (!) 119, temperature 97.7 F (36.5 C), temperature source Oral, resp. rate 20, height 5\' 1"  (1.549 m), weight 89.8 kg, SpO2 98 %.Body mass index is 37.41 kg/m.  General Appearance: Casual  Eye Contact:  Good  Speech:  Normal Rate  Volume:  Normal  Mood:  Euthymic  Affect:  Congruent  Thought Process:  Coherent  Orientation:  Full (Time, Place, and Person)  Thought Content:  Logical and Tangential  Suicidal Thoughts:  No  Homicidal Thoughts:  No  Memory:  Immediate;   Fair Recent;   Fair Remote;   Fair  Judgement:  Fair  Insight:  Fair  Psychomotor Activity:  Normal  Concentration:  Concentration: Fair and Attention Span: Fair  Recall:  of Knowledge:  Fair  Language:  Fair  Akathisia:  Negative  Handed:  Right  AIMS (if indicated):     Assets:  Communication Skills Desire for Improvement Leisure Time Physical Health Resilience  ADL's:  Intact  Cognition:  WNL  Sleep:  Number of Hours: 7.75      Have you used any form of tobacco in the last 30 days? (Cigarettes, Smokeless Tobacco, Cigars, and/or Pipes): Yes  Has this patient used any form of tobacco in the last 30 days? (Cigarettes, Smokeless Tobacco, Cigars, and/or Pipes) Yes, No  Blood Alcohol level:  Lab Results  Component Value Date   ETH <10 06/23/2019   ETH <10 05/16/2019    Metabolic Disorder Labs:  Lab Results  Component Value Date   HGBA1C 6.3 (H) 04/13/2019   MPG 134.11 04/13/2019   No results found for: PROLACTIN Lab Results  Component Value Date   CHOL 191 04/13/2019   TRIG 125 04/13/2019   HDL 64 04/13/2019   CHOLHDL 3.0 04/13/2019   VLDL 25 04/13/2019   LDLCALC 102 (H) 04/13/2019    See  Psychiatric Specialty Exam and Suicide Risk Assessment completed by Attending Physician prior to discharge.  Discharge destination:  Home  Is patient on multiple antipsychotic therapies at discharge:  No   Has Patient had three or more failed trials of antipsychotic monotherapy by history:  No  Recommended Plan for Multiple Antipsychotic Therapies: NA   Allergies as of 06/27/2019   No Known Allergies     Medication List    STOP taking these medications   ARIPiprazole 5 MG tablet Commonly known as: ABILIFY   EnBrace HR Caps     TAKE these medications     Indication  amantadine 100 MG capsule Commonly known as: SYMMETREL Take 1 capsule (100 mg total) by mouth 3 (three) times daily.  Indication: Extrapyramidal Reaction caused by Medications   FLUoxetine 20 MG capsule Commonly known as: PROZAC Take 1 capsule (20 mg total) by mouth daily.  Indication: Depression   QUEtiapine 25 MG tablet Commonly known as: SEROQUEL Take 1 tablet (25 mg total) by mouth 3 (three) times daily.  Indication: Depressive Phase of Manic-Depression   traZODone 100 MG tablet Commonly known as: DESYREL Take 1 tablet (100 mg total) by mouth at bedtime as needed for sleep.  Indication: Trouble Sleeping        Signed: Malvin JohnsFARAH,Kenlyn Lose, MD 06/27/2019, 7:50 AM

## 2019-06-27 NOTE — ED Notes (Signed)
Patient has been wand by security.  Patient is dressed out and has on safety socks.  Patient has two personal belonging bags behind the triage nurses station and a leather black bag.

## 2019-06-27 NOTE — ED Notes (Signed)
Writer spoke with Mal[place] from [facility] to confirm status of pt being accepted at that facility and was advised that the pt was seen at the crisis center and did not meet criteria for admission and was told to return to [facility] for further care. Merleen Nicely PA notified of [facility] statement.

## 2019-06-27 NOTE — Progress Notes (Signed)
Discharge:  Patient discharged with lift.  Patient denied SI and HI.  Denied A/V hallucinations.  Suicide prevention information given and discussed with patient who stated she understood and had no questions.  Patient stated she received all her belongings, clothing, toiletries, misc items.  Patient stated she appreciated all assistance received from [facility] staff.  All required discharge information given to patient at discharge.
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
## 2019-06-27 NOTE — Progress Notes (Signed)
Recreation Therapy Notes  INPATIENT RECREATION TR PLAN  Patient Details Name: Jordan Walton MRN: [id number] DOB: March 14, 1991 Today's Date: 06/27/2019  Rec Therapy Plan Is patient appropriate for Therapeutic Recreation?: Yes Treatment times per week: about 3 days Estimated Length of Stay: 5-7 days TR Treatment/Interventions: Group participation (Comment)  Discharge Criteria Pt will be discharged from therapy if:: Discharged Treatment plan/goals/alternatives discussed and agreed upon by:: Patient/family  Discharge Summary Short term goals set: See patient care plan Short term goals met: Not met Reason goals not met: Pt did not attend group sessions. Therapeutic equipment acquired: N/A Reason patient discharged from therapy: Discharge from hospital Pt/family agrees with progress & goals achieved: Yes Date patient discharged from therapy: 06/27/19     Victorino Sparrow, LRT/CTRS  Ria Comment, Cyrilla Durkin A 06/27/2019, 10:44 AM

## 2019-06-27 NOTE — BHH Group Notes (Signed)
LCSW Group Therapy Note  Date and Time: 06/27/2019 @ 1:30pm  Type of Therapy and Topic: Group Therapy: Feelings Around Returning Home & Establishing a Supportive Framework and Supporting Oneself When Supports Not Available  Participation Level: BHH PARTICIPATION LEVEL: Did Not Attend  Mood: Did not attend     Description of Group:  Patients first processed thoughts and feelings about upcoming discharge. These included fears of upcoming changes, lack of change, new living environments, judgements and expectations from others and overall stigma of mental health issues. The group then discussed the definition of a supportive framework, what that looks and feels like, and how do to discern it from an unhealthy non-supportive network. The group identified different types of supports as well as what to do when your family/friends are less than helpful or unavailable     Therapeutic Goals   1.  Patient will identify one healthy supportive network that they can use at discharge.  2.  Patient will identify one factor of a supportive framework and how to tell it from an unhealthy network.  3.  Patient able to identify one coping skill to use when they do not have positive supports from others.  4.  Patient will demonstrate ability to communicate their needs through discussion and/or role plays.     Summary of Patient Progress:     Patient did not attend group today.        Therapeutic Modalities  Cognitive Behavioral Therapy  Motivational Interviewing   Kymberly Blomberg, MSW, LCSW  

## 2019-06-27 NOTE — Progress Notes (Signed)
Patient ID: Jordan Walton, female   DOB: 21-Aug-1991, 28 y.o.   MRN: [id number]   CSW contacted pt's father to discuss aftercare and housing for the patient. Pt's father stated that [facility] at [facility] will get back to him once her boss is out of a meeting and can give him a decision.  Pt's father stated that Lujean Rave from [facility] gave him a list of long term placement options that the patient needs to contact. Pt's father gave those names and contact information to the patient to call and told her that she needs to get out of bed and find herself somewhere to go.  CSW asked pt's father to continue to stay in contact since the patient is going to be discharged today.
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
## 2019-06-27 NOTE — Progress Notes (Signed)
Patient ID: Jordan Walton, female   DOB: 07/08/1991, 28 y.o.   MRN: [id number]   CSW was unable to get in touch with the pt's father. CSW contacted at least 4 times with MSW intern. CSW printed out [facility], [facility], and [facility]. Patient stated that she will just go to [facility] and needs a ride there.

## 2019-06-27 NOTE — BHH Suicide Risk Assessment (Signed)
[facility] Discharge Suicide Risk Assessment   Principal Problem: <principal problem not specified> Discharge Diagnoses: Active Problems:   Cocaine abuse with cocaine-induced mood disorder (HCC)   MDD (major depressive disorder)   Total Time spent with patient: 45 minutes Musculoskeletal: Strength & Muscle Tone: within normal limits Gait & Station: normal Patient leans: N/A  Psychiatric Specialty Exam: Physical Exam  ROS  Blood pressure 116/72, pulse (!) 119, temperature 97.7 F (36.5 C), temperature source Oral, resp. rate 20, height 5\' 1"  (1.549 m), weight 89.8 kg, SpO2 98 %.Body mass index is 37.41 kg/m.  General Appearance: Casual  Eye Contact:  Good  Speech:  Normal Rate  Volume:  Normal  Mood:  Euthymic  Affect:  Congruent  Thought Process:  Coherent  Orientation:  Full (Time, Place, and Person)  Thought Content:  Logical and Tangential  Suicidal Thoughts:  No  Homicidal Thoughts:  No  Memory:  Immediate;   Fair Recent;   Fair Remote;   Fair  Judgement:  Fair  Insight:  Fair  Psychomotor Activity:  Normal  Concentration:  Concentration: Fair and Attention Span: Fair  Recall:  [employer] of Knowledge:  Fair  Language:  Fair  Akathisia:  Negative  Handed:  Right  AIMS (if indicated):     Assets:  Communication Skills Desire for Improvement Leisure Time Physical Health Resilience  ADL's:  Intact  Cognition:  WNL  Sleep:  Number of Hours: 7.75     Mental Status Per Nursing Assessment::   On Admission:  Suicidal ideation indicated by patient  Demographic Factors:  Low socioeconomic status  Loss Factors: Decrease in vocational status  Historical Factors: NA  Risk Reduction Factors:   Religious beliefs about death  Continued Clinical Symptoms:  Alcohol/Substance Abuse/Dependencies  Cognitive Features That Contribute To Risk:  Polarized thinking    Suicide Risk:  Minimal: No identifiable suicidal ideation.  Patients presenting with no risk factors  but with morbid ruminations; may be classified as minimal risk based on the severity of the depressive symptoms    Plan Of Care/Follow-up recommendations:  Activity:  full  Jonica Bickhart, MD 06/27/2019, 7:53 AM

## 2019-06-27 NOTE — Progress Notes (Signed)
Patient ID: Jordan Walton, female   DOB: Jan 20, 1991, 28 y.o.   MRN: [id number]   CSW was contacted by Social Work Environmental consultant to inform patient that Myrlene Broker from [facility] wants to speak with her.   CSW attempted to transfer Shayla twice. CSW finally was able to transfer Shayla to the patient to speak to her.  Myrlene Broker called Social Work Environmental consultant and stated that the patient hung up on her.   CSW met with the patient and asked what Shayla from [facility] told her. Patient stated that [facility] told her that she cannot have a bed until Monday and that Myrlene Broker will be calling her father.  CSW attempted to contact pt's father twice and was unsuccessful. CSW informed Social Work Barista.

## 2019-06-27 NOTE — Progress Notes (Signed)
  [facility] Adult Case Management Discharge Plan :  Will you be returning to the same living situation after discharge:  No.; Patient is going to go to [facility]. Patient self-sabotaged her discharge throughout the day. CSW tried multiple times to secure placement and somewhere for the patient to live.  At discharge, do you have transportation home?: Yes,  [employer] Do you have the ability to pay for your medications: No.  Release of information consent forms completed and in the chart;  Patient's signature needed at discharge.  Patient to Follow up at: Follow-up Information    [facility] Follow up.   Specialty: Professional Counselor Why: Please follow up for outpatient services during walk-in hours; Monday-Friday 8:30a-12:00p and 1:00-2:30p. Be sure to bring your photo ID, insurance card, SSN, and current medications.  Contact information: [facility] [phone]        [facility] Follow up.   Specialty: Addiction Medicine Why: A referral has been made. If you wish to seek treatment please follow up and speak with [facility] regarding possible admission.  Contact information: [place]  [phone]           Next level of care provider has access to [place] and Suicide Prevention discussed: Yes,  pt's father  Have you used any form of tobacco in the last 30 days? (Cigarettes, Smokeless Tobacco, Cigars, and/or Pipes): Yes  Has patient been referred to the Quitline?: Patient refused referral  Patient has been referred for addiction treatment: Yes  Trecia Rogers, LCSW 06/27/2019, 3:27 PM

## 2019-06-27 NOTE — Plan of Care (Signed)
Pt did not attend recreational therapy group sessions.   Surie Suchocki, LRT/CTRS 

## 2019-06-28 LAB — SARS CORONAVIRUS 2 BY RT PCR (HOSPITAL ORDER, PERFORMED IN ~~LOC~~ HOSPITAL LAB): SARS Coronavirus 2: NEGATIVE

## 2019-06-28 MED ORDER — DOXYCYCLINE HYCLATE 100 MG PO CAPS: 100.0000 mg | ORAL_CAPSULE | Freq: Two times a day (BID) | ORAL | 0 refills | Status: DC
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
## 2019-06-28 NOTE — ED Provider Notes (Signed)
  2:38 AM TTS has evaluated, recommends to follow-up with her OP services that were already set up for her at discharge yesterday from [facility].  See their separate note.  She will be discharged home with prescription for doxycycline for abscesses on the legs.  She may return here for any new/acute changes.   Larene Pickett, PA-C 06/28/19 [id number]    Randal Buba, April, MD 06/28/19 [id number]

## 2019-06-28 NOTE — Discharge Instructions (Addendum)
Follow-up with your counseling center as recommended at discharge from [facility]. Take doxycycline as prescribed, keep abscesses clean with soap and warm water. Follow-up with your primary care doctor. Return here for any new/acute changes.

## 2019-09-09 ENCOUNTER — Encounter (HOSPITAL_COMMUNITY): Payer: Self-pay | Admitting: Emergency Medicine

## 2019-09-09 ENCOUNTER — Emergency Department (HOSPITAL_COMMUNITY)
Admission: EM | Admit: 2019-09-09 | Discharge: 2019-09-10 | Disposition: A | Discharge status: Home / Self Care | Payer: Self-pay | Attending: Emergency Medicine | Admitting: Emergency Medicine

## 2019-09-09 ENCOUNTER — Other Ambulatory Visit: Payer: Self-pay

## 2019-09-09 DIAGNOSIS — F1414 Cocaine abuse with cocaine-induced mood disorder: Secondary | ICD-10-CM | POA: Diagnosis present

## 2019-09-09 DIAGNOSIS — Z79899 Other long term (current) drug therapy: Secondary | ICD-10-CM | POA: Insufficient documentation

## 2019-09-09 DIAGNOSIS — F17210 Nicotine dependence, cigarettes, uncomplicated: Secondary | ICD-10-CM | POA: Insufficient documentation

## 2019-09-09 DIAGNOSIS — F329 Major depressive disorder, single episode, unspecified: Secondary | ICD-10-CM | POA: Insufficient documentation

## 2019-09-09 DIAGNOSIS — F1910 Other psychoactive substance abuse, uncomplicated: Secondary | ICD-10-CM | POA: Insufficient documentation

## 2019-09-09 DIAGNOSIS — R45851 Suicidal ideations: Secondary | ICD-10-CM | POA: Insufficient documentation

## 2019-09-09 DIAGNOSIS — Z046 Encounter for general psychiatric examination, requested by authority: Secondary | ICD-10-CM | POA: Insufficient documentation

## 2019-09-09 LAB — CBC
HCT: 39 % (ref 36.0–46.0)
Hemoglobin: 12.3 g/dL (ref 12.0–15.0)
MCH: 27.4 pg (ref 26.0–34.0)
MCHC: 31.5 g/dL (ref 30.0–36.0)
MCV: 86.9 fL (ref 80.0–100.0)
Platelets: 472 10*3/uL — ABNORMAL HIGH (ref 150–400)
RBC: 4.49 MIL/uL (ref 3.87–5.11)
RDW: 14.7 % (ref 11.5–15.5)
WBC: 11.1 10*3/uL — ABNORMAL HIGH (ref 4.0–10.5)
nRBC: 0 % (ref 0.0–0.2)

## 2019-09-09 LAB — COMPREHENSIVE METABOLIC PANEL
ALT: 19 U/L (ref 0–44)
AST: 23 U/L (ref 15–41)
Albumin: 3.5 g/dL (ref 3.5–5.0)
Alkaline Phosphatase: 70 U/L (ref 38–126)
Anion gap: 8 (ref 5–15)
BUN: 10 mg/dL (ref 6–20)
CO2: 24 mmol/L (ref 22–32)
Calcium: 9.4 mg/dL (ref 8.9–10.3)
Chloride: 108 mmol/L (ref 98–111)
Creatinine, Ser: 1.07 mg/dL — ABNORMAL HIGH (ref 0.44–1.00)
GFR calc Af Amer: >60 mL/min (ref >60)
GFR calc non Af Amer: >60 mL/min (ref >60)
Glucose, Bld: 99 mg/dL (ref 70–99)
Potassium: 4.1 mmol/L (ref 3.5–5.1)
Sodium: 140 mmol/L (ref 135–145)
Total Bilirubin: 0.2 mg/dL — ABNORMAL LOW (ref 0.3–1.2)
Total Protein: 7.9 g/dL (ref 6.5–8.1)

## 2019-09-09 LAB — ETHANOL: Alcohol, Ethyl (B): <10 mg/dL (ref <10)

## 2019-09-09 LAB — ACETAMINOPHEN LEVEL: Acetaminophen (Tylenol), Serum: <10 ug/mL — ABNORMAL LOW (ref 10–30)

## 2019-09-09 LAB — I-STAT BETA HCG BLOOD, ED (MC, WL, AP ONLY): I-stat hCG, quantitative: <5 m[IU]/mL (ref <5)

## 2019-09-09 LAB — SALICYLATE LEVEL: Salicylate Lvl: <7 mg/dL (ref 2.8–30.0)

## 2019-09-09 NOTE — BH Assessment (Signed)
[facility] Assessment Progress Note  Case was staffed with Lord DNP who recommended patient be observed and monitored.         

## 2019-09-09 NOTE — ED Notes (Signed)
Patient is resting comfortably. 

## 2019-09-09 NOTE — ED Notes (Signed)
Pt changed into wine colored scrubs and was wanded by [employer]. Items given to [facility].

## 2019-09-09 NOTE — ED Provider Notes (Signed)
[place] EMERGENCY DEPARTMENT Provider Note   CSN: [id number] Arrival date & time: 09/09/19  0906     History   Chief Complaint Chief Complaint  Patient presents with  . Suicidal  . Drug Problem    HPI Jordan Walton is a 28 y.o. female.     Patient is a 28 year old female who presents with suicidal ideations.  She has a history of cocaine abuse and states that she started using cocaine again yesterday.  She is having suicidal ideations although she cannot really elaborate on these or what is causing her to be more depressed other than she is upset about using the cocaine.  She will wake up appropriately and answer questions during interview but then goes right back to sleep.  History is limited due to this.  She is currently denying any recent illnesses or physical complaints.     Past Medical History:  Diagnosis Date  . Crack cocaine use     Patient Active Problem List   Diagnosis Date Noted  . MDD (major depressive disorder) 06/24/2019  . Substance induced mood disorder ([place]) 04/11/2019  . Cocaine abuse ([place]) 04/13/2017  . Cocaine abuse with cocaine-induced mood disorder ([place]) 04/13/2017    History reviewed. No pertinent surgical history.   OB History   No obstetric history on file.      Home Medications    Prior to Admission medications   Medication Sig Start Date End Date Taking? Authorizing Provider  amantadine (SYMMETREL) 100 MG capsule Take 1 capsule (100 mg total) by mouth 3 (three) times daily. 06/27/19   Johnn Hai, MD  doxycycline (VIBRAMYCIN) 100 MG capsule Take 1 capsule (100 mg total) by mouth 2 (two) times daily. 06/28/19   Larene Pickett, PA-C  FLUoxetine (PROZAC) 20 MG capsule Take 1 capsule (20 mg total) by mouth daily. 06/27/19   Johnn Hai, MD  QUEtiapine (SEROQUEL) 25 MG tablet Take 1 tablet (25 mg total) by mouth 3 (three) times daily. Patient not taking: Reported on 06/27/2019 06/27/19   Johnn Hai, MD   traZODone (DESYREL) 100 MG tablet Take 1 tablet (100 mg total) by mouth at bedtime as needed for sleep. Patient not taking: Reported on 06/23/2019 04/15/19   Johnn Hai, MD    Family History No family history on file.  Social History Social History   Tobacco Use  . Smoking status: Current Every Day Smoker    Types: Cigarettes  . Smokeless tobacco: Never Used  Substance Use Topics  . Alcohol use: Yes    Alcohol/week: 6.0 - 12.0 standard drinks    Types: 6 - 12 Cans of beer per week  . Drug use: Yes    Frequency: 7.0 times per week    Types: "Crack" cocaine    Comment: Daily use -- up to a gram     Allergies   Patient has no known allergies.   Review of Systems Review of Systems  Constitutional: Negative for chills, diaphoresis, fatigue and fever.  HENT: Negative for congestion, rhinorrhea and sneezing.   Eyes: Negative.   Respiratory: Negative for cough, chest tightness and shortness of breath.   Cardiovascular: Negative for chest pain and leg swelling.  Gastrointestinal: Negative for abdominal pain, blood in stool, diarrhea, nausea and vomiting.  Genitourinary: Negative for difficulty urinating, flank pain, frequency and hematuria.  Musculoskeletal: Negative for arthralgias and back pain.  Skin: Negative for rash.  Neurological: Negative for dizziness, speech difficulty, weakness, numbness and headaches.  Psychiatric/Behavioral: Positive  for dysphoric mood and suicidal ideas.     Physical Exam Updated Vital Signs BP 111/66 (BP Location: Right Arm)   Pulse 88   Temp 98.8 F (37.1 C) (Oral)   Resp 18   SpO2 96%   Physical Exam Constitutional:      Appearance: She is well-developed.     Comments: Sleepy, but will wake up and answer questions appropriatelky  HENT:     Head: Normocephalic and atraumatic.  Eyes:     Pupils: Pupils are equal, round, and reactive to light.  Neck:     Musculoskeletal: Normal range of motion and neck supple.  Cardiovascular:      Rate and Rhythm: Normal rate and regular rhythm.     Heart sounds: Normal heart sounds.  Pulmonary:     Effort: Pulmonary effort is normal. No respiratory distress.     Breath sounds: Normal breath sounds. No wheezing or rales.  Chest:     Chest wall: No tenderness.  Abdominal:     General: Bowel sounds are normal.     Palpations: Abdomen is soft.     Tenderness: There is no abdominal tenderness. There is no guarding or rebound.  Musculoskeletal: Normal range of motion.  Lymphadenopathy:     Cervical: No cervical adenopathy.  Skin:    General: Skin is warm and dry.     Findings: No rash.  Neurological:     Mental Status: She is alert and oriented to person, place, and time.      ED Treatments / Results  Labs (all labs ordered are listed, but only abnormal results are displayed) Labs Reviewed  COMPREHENSIVE METABOLIC PANEL - Abnormal; Notable for the following components:      Result Value   Creatinine, Ser 1.07 (*)    Total Bilirubin 0.2 (*)    All other components within normal limits  ACETAMINOPHEN LEVEL - Abnormal; Notable for the following components:   Acetaminophen (Tylenol), Serum <10 (*)    All other components within normal limits  CBC - Abnormal; Notable for the following components:   WBC 11.1 (*)    Platelets 472 (*)    All other components within normal limits  ETHANOL  SALICYLATE LEVEL  RAPID URINE DRUG SCREEN, HOSP PERFORMED  I-STAT BETA HCG BLOOD, ED ([facility], [facility], [facility] ONLY)  CBG MONITORING, ED    EKG EKG Interpretation  Date/Time:  Monday September 09 2019 09:16:43 EST Ventricular Rate:  85 PR Interval:  162 QRS Duration: 76 QT Interval:  358 QTC Calculation: 426 R Axis:   67 Text Interpretation:  Poor data quality, interpretation may be adversely affected Normal sinus rhythm Normal ECG since last tracing no significant change Confirmed by Rolan Bucco ([id number]) on 09/09/2019 10:09:28 AM   Radiology No results found.  Procedures Procedures  (including critical care time)  Medications Ordered in ED Medications - No data to display   Initial Impression / Assessment and Plan / ED Course  I have reviewed the triage vital signs and the nursing notes.  Pertinent labs & imaging results that were available during my care of the patient were reviewed by me and considered in my medical decision making (see chart for details).        Patient is medically cleared and awaiting TTS evaluation.  Final Clinical Impressions(s) / ED Diagnoses   Final diagnoses:  Suicidal thoughts  Substance abuse [facility])    ED Discharge Orders    None       Rolan Bucco, MD  09/09/19 1457  
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
## 2019-09-09 NOTE — ED Notes (Signed)
Pt given burgundy scrubs, and security called to wand patient.

## 2019-09-09 NOTE — ED Notes (Addendum)
Crack pipe found in pt's bra while performing an EKG. Pt informed that it was found. Pt very apologetic. Informed Chelsea - RN. Security informed of crack pipe and this Tech was told to dispose of it in a sharps container. Pipe placed in sharps container. Pt informed that crack pipe was disposed of. Pt said, "Thank you".

## 2019-09-09 NOTE — ED Triage Notes (Addendum)
Pt arrives via [facility], pt just recently got out of rehab for cocaine use, began using again last night, woke up this am feeling suicidal. Pt a/ox4, resp e/u, nad. Pt coughing in triage.

## 2019-09-10 MED ORDER — FLUOXETINE HCL 20 MG PO CAPS
20.0000 mg | ORAL_CAPSULE | Freq: Every day | ORAL | Status: DC
  Administered 2019-09-10: 20 mg via ORAL
  Filled 2019-09-10: qty 1

## 2019-09-10 MED ORDER — QUETIAPINE FUMARATE 25 MG PO TABS
25.0000 mg | ORAL_TABLET | Freq: Three times a day (TID) | ORAL | Status: DC
  Administered 2019-09-10: 25 mg via ORAL
  Filled 2019-09-10 (×3): qty 1

## 2019-09-10 NOTE — TOC Transition Note (Signed)
Transition of Care [facility]) - CM/SW Discharge Note   Patient Details  Name: Jordan Walton MRN: [id number] Date of Birth: 1991/07/10  Transition of Care [facility]) CM/SW Contact:  Vergie Living, LCSW Phone Number: 09/10/2019, 9:22 AM   Clinical Narrative:   CSW and [facility] spoke with PT about discharge plan. CSW provided MH and SA resources and notified patient about ARCavailability list.  PT requested taxi upon discharge.  Request was passed to ED staff.          Patient Goals and CMS Choice        Discharge Placement                       Discharge Plan and Services                                     Social Determinants of Health (SDOH) Interventions     Readmission Risk Interventions No flowsheet data found.

## 2019-09-10 NOTE — ED Notes (Signed)
Lunch Tray Ordered @ 1042. 

## 2019-09-10 NOTE — ED Provider Notes (Signed)
11:10 AM Nursing reports that patient has not been psychiatrically for discharge home.  We will go assess the patient and likely discharge for outpatient psychiatric management.  11:24 AM I went to assess the patient and she is feeling much better.  She is used with discharge.  She will be discharged to follow-up as an outpatient with psychiatry team.  Patient discharged in good condition.   Clinical Impression: 1. Suicidal thoughts   2. Substance abuse ([place])     Disposition: Discharge  Condition: Good  I have discussed the results, Dx and Tx plan with the pt(& family if present). He/she/they expressed understanding and agree(s) with the plan. Discharge instructions discussed at great length. Strict return precautions discussed and pt &/or family have verbalized understanding of the instructions. No further questions at time of discharge.    New Prescriptions   No medications on file    Follow Up: your outpatient psychiatry team.     [place] [address] [id number] [facility] [place]        Tegeler, Gwenyth Allegra, MD 09/10/19 1125

## 2019-09-10 NOTE — BHH Counselor (Signed)
Per Waylan Boga, PMHNP patient does not meet in patient criteria and is psychiatrically cleared. CSW met with patient this morning to provide resources.

## 2019-09-10 NOTE — Discharge Instructions (Signed)
The behavioral health team felt that you are safe to go home today after observation.  Please follow-up with the outpatient psychiatry team.  If any symptoms change or worsen or you have more thoughts of hurting yourself, please return to nearest emergency department.

## 2019-09-10 NOTE — Discharge Planning (Signed)
EDCM provided taxi voucher to ED staff for transportation to local location.

## 2019-09-10 NOTE — Progress Notes (Signed)
Recommend patient follow up with outpatient services for substance use disorder.  Homeless resources needed.  Psychiatric admission not warranted.  Waylan Boga, PMHNP

## 2019-09-10 NOTE — ED Notes (Signed)
Pt belongings inventoried and placed in locker 1 

## 2019-09-10 NOTE — ED Notes (Signed)
Breakfast ordered 

## 2019-11-09 ENCOUNTER — Other Ambulatory Visit: Payer: Self-pay

## 2019-11-09 ENCOUNTER — Encounter (HOSPITAL_COMMUNITY): Payer: Self-pay | Admitting: *Deleted

## 2019-11-09 ENCOUNTER — Inpatient Hospital Stay (HOSPITAL_COMMUNITY)
Admission: AD | Admit: 2019-11-09 | Discharge: 2019-11-19 | DRG: 832 | Disposition: A | Discharge status: Home / Self Care | Payer: Federal, State, Local not specified - Other | Source: Intra-hospital | Attending: Psychiatry | Admitting: Psychiatry

## 2019-11-09 ENCOUNTER — Emergency Department (HOSPITAL_COMMUNITY)
Admission: EM | Admit: 2019-11-09 | Discharge: 2019-11-09 | Disposition: A | Discharge status: Other Acute Inpatient Hospital | Payer: Medicaid - Dental | Attending: Emergency Medicine | Admitting: Emergency Medicine

## 2019-11-09 ENCOUNTER — Encounter (HOSPITAL_COMMUNITY): Payer: Self-pay | Admitting: Psychiatry

## 2019-11-09 DIAGNOSIS — O99311 Alcohol use complicating pregnancy, first trimester: Secondary | ICD-10-CM | POA: Diagnosis present

## 2019-11-09 DIAGNOSIS — O26891 Other specified pregnancy related conditions, first trimester: Secondary | ICD-10-CM | POA: Insufficient documentation

## 2019-11-09 DIAGNOSIS — F339 Major depressive disorder, recurrent, unspecified: Secondary | ICD-10-CM | POA: Diagnosis present

## 2019-11-09 DIAGNOSIS — Z590 Homelessness: Secondary | ICD-10-CM

## 2019-11-09 DIAGNOSIS — F250 Schizoaffective disorder, bipolar type: Secondary | ICD-10-CM | POA: Insufficient documentation

## 2019-11-09 DIAGNOSIS — Z560 Unemployment, unspecified: Secondary | ICD-10-CM

## 2019-11-09 DIAGNOSIS — G4700 Insomnia, unspecified: Secondary | ICD-10-CM | POA: Diagnosis present

## 2019-11-09 DIAGNOSIS — Z20822 Contact with and (suspected) exposure to covid-19: Secondary | ICD-10-CM | POA: Insufficient documentation

## 2019-11-09 DIAGNOSIS — F1424 Cocaine dependence with cocaine-induced mood disorder: Secondary | ICD-10-CM | POA: Diagnosis present

## 2019-11-09 DIAGNOSIS — F17210 Nicotine dependence, cigarettes, uncomplicated: Secondary | ICD-10-CM | POA: Diagnosis present

## 2019-11-09 DIAGNOSIS — Z818 Family history of other mental and behavioral disorders: Secondary | ICD-10-CM

## 2019-11-09 DIAGNOSIS — F1910 Other psychoactive substance abuse, uncomplicated: Secondary | ICD-10-CM

## 2019-11-09 DIAGNOSIS — Z3201 Encounter for pregnancy test, result positive: Secondary | ICD-10-CM

## 2019-11-09 DIAGNOSIS — Z3491 Encounter for supervision of normal pregnancy, unspecified, first trimester: Secondary | ICD-10-CM

## 2019-11-09 DIAGNOSIS — Z79899 Other long term (current) drug therapy: Secondary | ICD-10-CM

## 2019-11-09 DIAGNOSIS — K5900 Constipation, unspecified: Secondary | ICD-10-CM | POA: Diagnosis present

## 2019-11-09 DIAGNOSIS — O99351 Diseases of the nervous system complicating pregnancy, first trimester: Secondary | ICD-10-CM | POA: Diagnosis present

## 2019-11-09 DIAGNOSIS — F1994 Other psychoactive substance use, unspecified with psychoactive substance-induced mood disorder: Secondary | ICD-10-CM

## 2019-11-09 DIAGNOSIS — R45851 Suicidal ideations: Secondary | ICD-10-CM | POA: Diagnosis present

## 2019-11-09 DIAGNOSIS — F1020 Alcohol dependence, uncomplicated: Secondary | ICD-10-CM | POA: Insufficient documentation

## 2019-11-09 DIAGNOSIS — Z3A01 Less than 8 weeks gestation of pregnancy: Secondary | ICD-10-CM | POA: Insufficient documentation

## 2019-11-09 DIAGNOSIS — R519 Headache, unspecified: Secondary | ICD-10-CM | POA: Diagnosis not present

## 2019-11-09 DIAGNOSIS — F1420 Cocaine dependence, uncomplicated: Secondary | ICD-10-CM | POA: Insufficient documentation

## 2019-11-09 DIAGNOSIS — O99321 Drug use complicating pregnancy, first trimester: Secondary | ICD-10-CM | POA: Diagnosis present

## 2019-11-09 DIAGNOSIS — O99341 Other mental disorders complicating pregnancy, first trimester: Secondary | ICD-10-CM | POA: Diagnosis not present

## 2019-11-09 DIAGNOSIS — R251 Tremor, unspecified: Secondary | ICD-10-CM | POA: Diagnosis not present

## 2019-11-09 DIAGNOSIS — Z3A09 9 weeks gestation of pregnancy: Secondary | ICD-10-CM

## 2019-11-09 DIAGNOSIS — F10239 Alcohol dependence with withdrawal, unspecified: Secondary | ICD-10-CM | POA: Diagnosis present

## 2019-11-09 DIAGNOSIS — O99331 Smoking (tobacco) complicating pregnancy, first trimester: Secondary | ICD-10-CM | POA: Diagnosis present

## 2019-11-09 DIAGNOSIS — Z3490 Encounter for supervision of normal pregnancy, unspecified, unspecified trimester: Secondary | ICD-10-CM

## 2019-11-09 DIAGNOSIS — F419 Anxiety disorder, unspecified: Secondary | ICD-10-CM | POA: Diagnosis present

## 2019-11-09 HISTORY — DX: Herpesviral infection, unspecified: B00.9

## 2019-11-09 LAB — COMPREHENSIVE METABOLIC PANEL
ALT: 13 U/L (ref 0–44)
AST: 18 U/L (ref 15–41)
Albumin: 3.5 g/dL (ref 3.5–5.0)
Alkaline Phosphatase: 62 U/L (ref 38–126)
Anion gap: 11 (ref 5–15)
BUN: 7 mg/dL (ref 6–20)
CO2: 21 mmol/L — ABNORMAL LOW (ref 22–32)
Calcium: 9.4 mg/dL (ref 8.9–10.3)
Chloride: 103 mmol/L (ref 98–111)
Creatinine, Ser: 0.87 mg/dL (ref 0.44–1.00)
GFR calc Af Amer: >60 mL/min (ref >60)
GFR calc non Af Amer: >60 mL/min (ref >60)
Glucose, Bld: 92 mg/dL (ref 70–99)
Potassium: 3.9 mmol/L (ref 3.5–5.1)
Sodium: 135 mmol/L (ref 135–145)
Total Bilirubin: 0.8 mg/dL (ref 0.3–1.2)
Total Protein: 6.9 g/dL (ref 6.5–8.1)

## 2019-11-09 LAB — RESPIRATORY PANEL BY RT PCR (FLU A&B, COVID)
Influenza A by PCR: NEGATIVE
Influenza B by PCR: NEGATIVE
SARS Coronavirus 2 by RT PCR: NEGATIVE

## 2019-11-09 LAB — RAPID URINE DRUG SCREEN, HOSP PERFORMED
Amphetamines: NOT DETECTED
Barbiturates: NOT DETECTED
Benzodiazepines: NOT DETECTED
Cocaine: POSITIVE — AB
Opiates: NOT DETECTED
Tetrahydrocannabinol: NOT DETECTED

## 2019-11-09 LAB — CBC
HCT: 43 % (ref 36.0–46.0)
Hemoglobin: 13.7 g/dL (ref 12.0–15.0)
MCH: 28.4 pg (ref 26.0–34.0)
MCHC: 31.9 g/dL (ref 30.0–36.0)
MCV: 89 fL (ref 80.0–100.0)
Platelets: 451 10*3/uL — ABNORMAL HIGH (ref 150–400)
RBC: 4.83 MIL/uL (ref 3.87–5.11)
RDW: 15.1 % (ref 11.5–15.5)
WBC: 14.1 10*3/uL — ABNORMAL HIGH (ref 4.0–10.5)
nRBC: 0 % (ref 0.0–0.2)

## 2019-11-09 LAB — I-STAT BETA HCG BLOOD, ED (MC, WL, AP ONLY): I-stat hCG, quantitative: >2000 m[IU]/mL — ABNORMAL HIGH (ref <5)

## 2019-11-09 LAB — ACETAMINOPHEN LEVEL: Acetaminophen (Tylenol), Serum: <10 ug/mL — ABNORMAL LOW (ref 10–30)

## 2019-11-09 LAB — SALICYLATE LEVEL: Salicylate Lvl: <7 mg/dL — ABNORMAL LOW (ref 7.0–30.0)

## 2019-11-09 LAB — ETHANOL: Alcohol, Ethyl (B): <10 mg/dL (ref <10)

## 2019-11-09 MED ORDER — MAGNESIUM HYDROXIDE 400 MG/5ML PO SUSP: 30.0000 mL | Freq: Every day | ORAL | Status: DC | PRN

## 2019-11-09 MED ORDER — ACETAMINOPHEN 325 MG PO TABS: 650.0000 mg | ORAL_TABLET | Freq: Four times a day (QID) | ORAL | Status: DC | PRN

## 2019-11-09 MED ORDER — HYDROXYZINE HCL 25 MG PO TABS: 25.0000 mg | ORAL_TABLET | Freq: Three times a day (TID) | ORAL | Status: DC | PRN

## 2019-11-09 MED ORDER — ALUM & MAG HYDROXIDE-SIMETH 200-200-20 MG/5ML PO SUSP: 30.0000 mL | ORAL | Status: DC | PRN

## 2019-11-09 NOTE — Progress Notes (Signed)
Per Nira Conn, NP pt is recommended for OBS and to be reassessed by psych due to hx of suicide attempts and pt ongoing SI. AC is checking to see if we can bring pt to [facility] OBS unit. EDP Bero, Elmer Sow, MD and Chuck Hint Rhona Raider, RN have been advised.   Princess Bruins, MSW, LCSW Therapeutic Triage Specialist  [phone]

## 2019-11-09 NOTE — Progress Notes (Signed)
Pt tentatively accepted after 8am to to 404-1 pending negative covid test. Attending provider is Dr. Lucianne Muss, MD. Call to report [phone]. ED have been advised.  Princess Bruins, MSW, LCSW Therapeutic Triage Specialist  [phone]

## 2019-11-09 NOTE — ED Notes (Signed)
Pt awake and eating lunch.
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
## 2019-11-09 NOTE — BH Assessment (Signed)
Tele Assessment Note   Patient Name: Jordan Walton MRN: [id number] Referring Physician: Sabas Sous, MD Location of Patient: [facility] Location of Provider: Behavioral Health TTS Department  Jordan Walton is an 29 y.o. female who presents to the ED voluntarily. Pt is falling asleep throughout the assessment and is difficult to redirect. ED staff remains at bedside to assist with keeping pt engaged. Pt endorses SI with thoughts of walking into traffic. Per reports, pt attempted to step in front of a car PTA but the car swerved and missed her. Pt has a hx of ED visits and inpt hospitalizations c/o depression, polysubstance abuse, and suicide attempts. Pt states she does not have a current MH provider and states she is supposed to be taking medication but she is not taking it. Pt endorses AH that tell her she is no good. Pt has been admitted to [facility], [facility], and mult others over the past several years. Pt identifies her primary stressor as being homeless. Pt states she has no resources and no support.    Per Nira Conn, NP pt is recommended for OBS and to be reassessed by psych due to hx of suicide attempts and pt ongoing SI. AC is checking to see if we can bring pt to [facility] OBS unit. EDP Bero, Elmer Sow, MD and Chuck Hint Rhona Raider, RN have been advised.   Diagnosis: Schizoaffective d/o, Bipolar type; Cocaine use d/o, severe; Alcohol use d/o, moderate  Past Medical History:  Past Medical History:  Diagnosis Date  . Crack cocaine use     History reviewed. No pertinent surgical history.  Family History: No family history on file.  Social History:  reports that she has been smoking cigarettes. She has never used smokeless tobacco. She reports current alcohol use of about 6.0 - 12.0 standard drinks of alcohol per week. She reports current drug use. Frequency: 7.00 times per week. Drug: "Crack" cocaine.  Additional Social History:  Alcohol / Drug Use Pain Medications: See  MAR Prescriptions: See MAR Over the Counter: See MAR History of alcohol / drug use?: Yes Substance #1 Name of Substance 1: Cocaine 1 - Age of First Use: 17 1 - Amount (size/oz): excessive 1 - Frequency: daily 1 - Duration: ongoing 1 - Last Use / Amount: 11/08/19 Substance #2 Name of Substance 2: Alcohol 2 - Age of First Use: 18 2 - Amount (size/oz): 1 beer 2 - Frequency: occasional 2 - Duration: ongoing 2 - Last Use / Amount: 11/08/19  CIWA: CIWA-Ar BP: 130/73 Pulse Rate: 95 COWS:    Allergies: No Known Allergies  Home Medications: (Not in a hospital admission)   OB/GYN Status:  No LMP recorded.  General Assessment Data Assessment unable to be completed: Yes Reason for not completing assessment: TTS attempted to assess pt who is falling asleep and unable to stay aroused to engage in the assessment Location of Assessment: [facility] ED TTS Assessment: In system Is this a Tele or Face-to-Face Assessment?: Tele Assessment Is this an Initial Assessment or a Re-assessment for this encounter?: Initial Assessment Patient Accompanied by:: N/A Language Other than English: No Living Arrangements: Homeless/Shelter What gender do you identify as?: Female Marital status: Single Pregnancy Status: No Living Arrangements: Alone Can pt return to current living arrangement?: Yes Admission Status: Voluntary Is patient capable of signing voluntary admission?: Yes Referral Source: Self/Family/Friend Insurance type: none     Crisis Care Plan Living Arrangements: Alone Name of Psychiatrist: none Name of Therapist: none  Education Status Is patient  currently in school?: No Is the patient employed, unemployed or receiving disability?: Unemployed  Risk to self with the past 6 months Suicidal Ideation: Yes-Currently Present Has patient been a risk to self within the past 6 months prior to admission? : Yes Suicidal Intent: Yes-Currently Present Has patient had any suicidal intent within  the past 6 months prior to admission? : Yes Is patient at risk for suicide?: Yes Suicidal Plan?: Yes-Currently Present Has patient had any suicidal plan within the past 6 months prior to admission? : Yes Specify Current Suicidal Plan: pt tried to walk in front of traffic Access to Means: Yes Specify Access to Suicidal Means: pt has access to traffic  What has been your use of drugs/alcohol within the last 12 months?: cocaine, alcohol Previous Attempts/Gestures: Yes How many times?: 3 Other Self Harm Risks: hx of suicide attempts, substance abuse, homeless Triggers for Past Attempts: Other personal contacts, Unpredictable Intentional Self Injurious Behavior: None Family Suicide History: No Recent stressful life event(s): Job Loss, Financial Problems, Turmoil (Comment)(homeless, cocaine abuse) Persecutory voices/beliefs?: Yes Depression: Yes Depression Symptoms: Despondent, Feeling worthless/self pity Substance abuse history and/or treatment for substance abuse?: Yes Suicide prevention information given to non-admitted patients: Not applicable  Risk to Others within the past 6 months Homicidal Ideation: No Does patient have any lifetime risk of violence toward others beyond the six months prior to admission? : Yes (comment)(pt says when she is angry) Thoughts of Harm to Others: No Current Homicidal Intent: No Current Homicidal Plan: No Access to Homicidal Means: No History of harm to others?: No Assessment of Violence: None Noted Violent Behavior Description: pt says when she is angry she thinks of hurting the person who is making her angry  Does patient have access to weapons?: No Criminal Charges Pending?: No Does patient have a court date: No Is patient on probation?: No  Psychosis Hallucinations: Auditory Delusions: None noted  Mental Status Report Appearance/Hygiene: Bizarre Eye Contact: Poor Motor Activity: Unsteady Speech: Slow, Slurred Level of Consciousness:  Drowsy Mood: Preoccupied Affect: Constricted, Flat Anxiety Level: None Thought Processes: Irrelevant Judgement: Impaired Orientation: Place, Person, Time Obsessive Compulsive Thoughts/Behaviors: Minimal  Cognitive Functioning Concentration: Fair Memory: Remote Intact, Recent Intact Is patient IDD: No Insight: Fair Impulse Control: Poor Appetite: Good Have you had any weight changes? : No Change Sleep: Decreased Total Hours of Sleep: 1 Vegetative Symptoms: None  ADLScreening [facility] Assessment Services) Patient's cognitive ability adequate to safely complete daily activities?: Yes Patient able to express need for assistance with ADLs?: Yes Independently performs ADLs?: Yes (appropriate for developmental age)  Prior Inpatient Therapy Prior Inpatient Therapy: Yes Prior Therapy Dates: 2020, 2019 and others Prior Therapy Facilty/Provider(s): [facility], [facility] Reason for Treatment: POLYSUBSTANCE ABUSE, BIPOLAR  Prior Outpatient Therapy Prior Outpatient Therapy: Yes Prior Therapy Dates: 2020 Prior Therapy Facilty/Provider(s): [place] Reason for Treatment: MED MANAGEMENT Does patient have an ACCT team?: No Does patient have Intensive In-House Services?  : No Does patient have [facility] services? : No Does patient have P4CC services?: No  ADL Screening (condition at time of admission) Patient's cognitive ability adequate to safely complete daily activities?: Yes Is the patient deaf or have difficulty hearing?: No Does the patient have difficulty seeing, even when wearing glasses/contacts?: No Does the patient have difficulty concentrating, remembering, or making decisions?: No Patient able to express need for assistance with ADLs?: Yes Does the patient have difficulty dressing or bathing?: No Independently performs ADLs?: Yes (appropriate for developmental age) Does the patient have difficulty walking or climbing  stairs?: No Weakness of Legs: None Weakness of Arms/Hands: None  Home  Assistive Devices/Equipment Home Assistive Devices/Equipment: None    Abuse/Neglect Assessment (Assessment to be complete while patient is alone) Abuse/Neglect Assessment Can Be Completed: Yes Physical Abuse: Denies Verbal Abuse: Denies Sexual Abuse: Denies Exploitation of patient/patient's resources: Denies Self-Neglect: Denies     Merchant navy officer (For Healthcare) Does Patient Have a Medical Advance Directive?: No Would patient like information on creating a medical advance directive?: No - Patient declined          Disposition: Per Nira Conn, NP pt is recommended for OBS. AC is checking to see if we can bring pt to [facility] OBS unit. EDP Bero, Elmer Sow, MD and Chuck Hint Rhona Raider, RN have been advised.  Disposition Initial Assessment Completed for this Encounter: Yes Disposition of Patient: (continued OBS) Patient refused recommended treatment: No  This service was provided via telemedicine using a 2-way, interactive audio and video technology.  Names of all persons participating in this telemedicine service and their role in this encounter. Name:  Jordan Walton Role: Patient  Name: Princess Bruins Role: TTS          Karolee Ohs 11/09/2019 6:31 AM

## 2019-11-09 NOTE — Progress Notes (Signed)
Patient ID: Jordan Walton, female   DOB: [birth year], 29 y.o.   MRN: [id number] Pt A&O x 4, no distress noted, sleeping at present.  Pt upset due to pregnancy diagnosis.  Remains passive SI.  Monitoring for safety.

## 2019-11-09 NOTE — Progress Notes (Signed)
TTS attempted to assess pt who is falling asleep and unable to stay aroused to engage in the assessment. TTS called the pt's name several times and she does not respond. Cichorski, Rhona Raider, RN made aware the pt is sleeping and does not respond to TTS when her name is called.   Princess Bruins, MSW, LCSW Therapeutic Triage Specialist  [phone]

## 2019-11-09 NOTE — Progress Notes (Signed)
  Admission DAR NOTE: Pt alert and oriented by 3 very tearful at admission. 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 Skin assessment done and belongings searched per protocol.  Items deemed contraband secured in locke. On arrival to the unit Pt refused unit orientation stating that "this is not my first time here bitch go back to [place] where you belong  became very agitated and verbally abusive to staff took multiple times to redirect patient and deescalate. Other staff member were called for show of support to help  in the de-escalation process.  Emotional support and availability offered to Patient as needed. " Fluids  offered, tolerated well. Q15 minutes safety checks initiated without self harm gestures.

## 2019-11-09 NOTE — ED Notes (Signed)
Pt ate lunch. ALL belongings - 1 labeled belongings bag - Safe Transport - Pt aware.

## 2019-11-09 NOTE — ED Notes (Signed)
Breakfast ordered 

## 2019-11-09 NOTE — Progress Notes (Signed)
Pt has OBS bed at [facility] - Room 404-01.  Report can be called to [phone]

## 2019-11-09 NOTE — ED Notes (Signed)
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 States she does not want another baby and that she is unable to care for the one she has - is w/father. Pt voiced understanding and agreement w/tx plan - accepted to [facility] - Signed consent form - Copy faxed to [facility] - Copy sent to Medical Records - Original placed in envelope for [facility]. Called pt's father, Tenny Craw - [phone] - and advised pt will call him from [facility] per pt's request.

## 2019-11-10 DIAGNOSIS — F1994 Other psychoactive substance use, unspecified with psychoactive substance-induced mood disorder: Secondary | ICD-10-CM | POA: Diagnosis not present

## 2019-11-10 DIAGNOSIS — R519 Headache, unspecified: Secondary | ICD-10-CM | POA: Diagnosis not present

## 2019-11-10 DIAGNOSIS — O99351 Diseases of the nervous system complicating pregnancy, first trimester: Secondary | ICD-10-CM | POA: Diagnosis present

## 2019-11-10 DIAGNOSIS — F17210 Nicotine dependence, cigarettes, uncomplicated: Secondary | ICD-10-CM | POA: Diagnosis present

## 2019-11-10 DIAGNOSIS — F1424 Cocaine dependence with cocaine-induced mood disorder: Secondary | ICD-10-CM | POA: Diagnosis present

## 2019-11-10 DIAGNOSIS — F10239 Alcohol dependence with withdrawal, unspecified: Secondary | ICD-10-CM | POA: Diagnosis present

## 2019-11-10 DIAGNOSIS — Z560 Unemployment, unspecified: Secondary | ICD-10-CM | POA: Diagnosis not present

## 2019-11-10 DIAGNOSIS — Z590 Homelessness: Secondary | ICD-10-CM | POA: Diagnosis not present

## 2019-11-10 DIAGNOSIS — O99341 Other mental disorders complicating pregnancy, first trimester: Secondary | ICD-10-CM | POA: Diagnosis present

## 2019-11-10 DIAGNOSIS — Z818 Family history of other mental and behavioral disorders: Secondary | ICD-10-CM | POA: Diagnosis not present

## 2019-11-10 DIAGNOSIS — K5900 Constipation, unspecified: Secondary | ICD-10-CM | POA: Diagnosis present

## 2019-11-10 DIAGNOSIS — O99331 Smoking (tobacco) complicating pregnancy, first trimester: Secondary | ICD-10-CM | POA: Diagnosis present

## 2019-11-10 DIAGNOSIS — R45851 Suicidal ideations: Secondary | ICD-10-CM | POA: Diagnosis present

## 2019-11-10 DIAGNOSIS — O99321 Drug use complicating pregnancy, first trimester: Secondary | ICD-10-CM | POA: Diagnosis present

## 2019-11-10 DIAGNOSIS — F250 Schizoaffective disorder, bipolar type: Secondary | ICD-10-CM | POA: Diagnosis present

## 2019-11-10 DIAGNOSIS — O99311 Alcohol use complicating pregnancy, first trimester: Secondary | ICD-10-CM | POA: Diagnosis present

## 2019-11-10 DIAGNOSIS — Z79899 Other long term (current) drug therapy: Secondary | ICD-10-CM | POA: Diagnosis not present

## 2019-11-10 DIAGNOSIS — R251 Tremor, unspecified: Secondary | ICD-10-CM | POA: Diagnosis not present

## 2019-11-10 DIAGNOSIS — F419 Anxiety disorder, unspecified: Secondary | ICD-10-CM | POA: Diagnosis present

## 2019-11-10 DIAGNOSIS — G4700 Insomnia, unspecified: Secondary | ICD-10-CM | POA: Diagnosis present

## 2019-11-10 DIAGNOSIS — Z3A09 9 weeks gestation of pregnancy: Secondary | ICD-10-CM | POA: Diagnosis not present

## 2019-11-10 DIAGNOSIS — F332 Major depressive disorder, recurrent severe without psychotic features: Secondary | ICD-10-CM | POA: Diagnosis not present

## 2019-11-10 MED ORDER — COMPLETENATE 29-1 MG PO CHEW
1.0000 | CHEWABLE_TABLET | Freq: Every day | ORAL | Status: DC
  Filled 2019-11-10 (×2): qty 1

## 2019-11-10 MED ORDER — THIAMINE HCL 100 MG/ML IJ SOLN: 100.0000 mg | Freq: Once | INTRAMUSCULAR | Status: DC

## 2019-11-10 MED ORDER — LOPERAMIDE HCL 2 MG PO CAPS: 2.0000 mg | ORAL_CAPSULE | ORAL | Status: DC | PRN

## 2019-11-10 MED ORDER — HYDROXYZINE HCL 25 MG PO TABS: 25.0000 mg | ORAL_TABLET | Freq: Four times a day (QID) | ORAL | Status: DC | PRN

## 2019-11-10 MED ORDER — THIAMINE HCL 100 MG PO TABS
100.0000 mg | ORAL_TABLET | Freq: Every day | ORAL | Status: DC
  Administered 2019-11-11 – 2019-11-19 (×9): 100 mg via ORAL
  Filled 2019-11-10 (×13): qty 1

## 2019-11-10 MED ORDER — LORAZEPAM 1 MG PO TABS
1.0000 mg | ORAL_TABLET | Freq: Four times a day (QID) | ORAL | Status: AC | PRN
  Administered 2019-11-10 – 2019-11-12 (×3): 1 mg via ORAL
  Filled 2019-11-10 (×3): qty 1

## 2019-11-10 MED ORDER — FOLIC ACID 1 MG PO TABS
1.0000 mg | ORAL_TABLET | Freq: Every day | ORAL | Status: DC
  Administered 2019-11-10 – 2019-11-19 (×10): 1 mg via ORAL
  Filled 2019-11-10 (×13): qty 1

## 2019-11-10 MED ORDER — ONDANSETRON 4 MG PO TBDP: 4.0000 mg | ORAL_TABLET | Freq: Four times a day (QID) | ORAL | Status: DC | PRN

## 2019-11-10 NOTE — Progress Notes (Signed)
   11/10/19 2150  COVID-19 Daily Checkoff  Have you had a fever (temp > 37.80C/100F)  in the past 24 hours?  No  If you have had runny nose, nasal congestion, sneezing in the past 24 hours, has it worsened? No  COVID-19 EXPOSURE  Have you traveled outside the state in the past 14 days? No  Have you been in contact with someone with a confirmed diagnosis of COVID-19 or PUI in the past 14 days without wearing appropriate PPE? No  Have you been living in the same home as a person with confirmed diagnosis of COVID-19 or a PUI (household contact)? No  Have you been diagnosed with COVID-19? No

## 2019-11-10 NOTE — [facility] Suicide Risk Assessment (Signed)
[facility] Admission Suicide Risk Assessment   Nursing information obtained from:    Demographic factors:  Adolescent or young adult, Low socioeconomic status, Unemployed Current Mental Status:  Suicidal ideation indicated by patient Loss Factors:  Financial problems / change in socioeconomic status(Pregnancy) Historical Factors:  Prior suicide attempts, Impulsivity Risk Reduction Factors:  NA  Total Time spent with patient: 45 minutes Principal Problem: Substance induced mood disorder (HCC) Diagnosis:  Principal Problem:   Substance induced mood disorder (HCC) Active Problems:   Schizoaffective disorder, bipolar type (HCC)  Subjective Data:   Continued Clinical Symptoms:  Alcohol Use Disorder Identification Test Final Score (AUDIT): 19 The "Alcohol Use Disorders Identification Test", Guidelines for Use in Primary Care, Second Edition.  World Science writer [facility]). Score between 0-7:  no or low risk or alcohol related problems. Score between 8-15:  moderate risk of alcohol related problems. Score between 16-19:  high risk of alcohol related problems. Score 20 or above:  warrants further diagnostic evaluation for alcohol dependence and treatment.   CLINICAL FACTORS:  29 year old female, presented to ED reporting worsening depression, suicidal ideations, neuro-vegetative symptoms, demeaning auditory hallucinations , daily alcohol ( up to 15 beers per day) and cocaine use . She was found to be pregnant in ED. Patient reports LMP was in early January.  Significant psychosocial stressors include homelessness, poor support network.  History of prior psychiatric admissions, most recently in September 2020 for similar presentation- has been diagnosed with Substance Induced Mood Disorder. Had not been taking psychiatric medications prior to admission.   Psychiatric Specialty Exam: Physical Exam  Review of Systems  Blood pressure (!) 142/80, pulse 85, temperature 98.6 F (37 C), temperature  source Oral, resp. rate 16, height 4\' 11"  (1.499 m), weight 88 kg, SpO2 98 %.Body mass index is 39.18 kg/m.  See admit note MSE   COGNITIVE FEATURES THAT CONTRIBUTE TO RISK:  Closed-mindedness and Loss of executive function    SUICIDE RISK:   Moderate:  Frequent suicidal ideation with limited intensity, and duration, some specificity in terms of plans, no associated intent, good self-control, limited dysphoria/symptomatology, some risk factors present, and identifiable protective factors, including available and accessible social support.  PLAN OF CARE: Patient will be admitted to inpatient psychiatric unit for stabilization and safety. Will provide and encourage milieu participation. Provide medication management and maked adjustments as needed. Will also provide medication management to address WDL if needed . Will follow daily.    I certify that inpatient services furnished can reasonably be expected to improve the patient's condition.   , MD 11/10/2019, 5:27 PM
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
## 2019-11-10 NOTE — Progress Notes (Signed)
Pt transferred from obs by Vesta Mixer., RN. Pt denies active SI/HI. Pt provided with clothing from her locker. Required documents reviewed with the pt by Rayfield Citizen, RN. Pt oriented to the unit and remains safe.   Specimen cup provided to the pt. Education was provided.

## 2019-11-10 NOTE — Progress Notes (Signed)
   11/10/19 2152  Psych Admission Type (Psych Patients Only)  Admission Status Voluntary  Psychosocial Assessment  Patient Complaints None  Eye Contact Brief  Facial Expression Flat  Affect Appropriate to circumstance  Speech Logical/coherent  Interaction Assertive  Motor Activity Other (Comment) (WDL)  Appearance/Hygiene In scrubs  Behavior Characteristics Appropriate to situation  Mood Labile  Thought Process  Coherency WDL  Content WDL  Delusions None reported or observed  Perception WDL  Hallucination Auditory  Judgment Poor  Danger to Self  Current suicidal ideation? Denies  Self-Injurious Behavior No self-injurious ideation or behavior indicators observed or expressed   Agreement Not to Harm Self Yes  Description of Agreement Verbal  Danger to Others  Danger to Others None reported or observed

## 2019-11-10 NOTE — Consult Note (Addendum)
[facility] Face-to-Face Psychiatry Consult   Reason for Consult: Substance abuse and  suicidal ideation  referring Physician: [facility] observation Patient Identification: Jordan Walton MRN:  [id number] Principal Diagnosis: Substance induced mood disorder (HCC) Diagnosis:  Principal Problem:   Substance induced mood disorder (HCC) Active Problems:   Schizoaffective disorder, bipolar type (HCC)   Total Time spent with patient: 15 minutes  Subjective:   Jordan Walton is a 29 y.o. female was seen and evaluated by nurse practitioner.  Patient continues to endorse suicidal ideations with plans to overdose on cocaine.  Patient reports homelessness as a cause for her stressors.  Patient is requesting to stay additional day in order to secure additional outpatient resources. [name] reported " I am pregnant and I need to go back to [facility] to find out how far along I am."  " Women shelter and help to stop using cocaine."  Case staffed with attending psychiatrist who recommends inpatient admission.  Patient to transfer to 300 unit when available.  Support, encouragement and reassurance was provided.  HPI:  Per admission assessment note: TTS Assessment: Jordan Renee Walkeris an 29 y.o.femalewho presents to the ED voluntarily.Pt is falling asleep throughout the assessment and is difficult to redirect. ED staff remains at bedside to assist with keeping pt engaged. Pt endorses SI with thoughts of walking into traffic. Per reports, pt attempted to step in front of a car PTA but the car swerved and missed her. Pt has a hx of ED visits and inpt hospitalizations c/o depression, polysubstance abuse, and suicide attempts. Pt states she does not have a current MH provider and states she is supposed to be taking medication but she is not taking it. Pt endorses AH that tell her she is no good. Pt has been admitted to [facility], [facility], and mult others over the past several years. Pt identifies her primary  stressor as being homeless. Pt states she has no resources and no support.   Past Psychiatric History:  Risk to Self:   Risk to Others:   Prior Inpatient Therapy:   Prior Outpatient Therapy:    Past Medical History:  Past Medical History:  Diagnosis Date  . Crack cocaine use    History reviewed. No pertinent surgical history. Family History: History reviewed. No pertinent family history. Family Psychiatric  History:  Social History:  Social History   Substance and Sexual Activity  Alcohol Use Yes  . Alcohol/week: 6.0 - 12.0 standard drinks  . Types: 6 - 12 Cans of beer per week     Social History   Substance and Sexual Activity  Drug Use Yes  . Frequency: 7.0 times per week  . Types: "Crack" cocaine   Comment: Daily use -- up to a gram    Social History   Socioeconomic History  . Marital status: Single    Spouse name: Not on file  . Number of children: Not on file  . Years of education: Not on file  . Highest education level: Not on file  Occupational History  . Occupation: Unemployed  Tobacco Use  . Smoking status: Current Every Day Smoker    Types: Cigarettes  . Smokeless tobacco: Never Used  Substance and Sexual Activity  . Alcohol use: Yes    Alcohol/week: 6.0 - 12.0 standard drinks    Types: 6 - 12 Cans of beer per week  . Drug use: Yes    Frequency: 7.0 times per week    Types: "Crack" cocaine    Comment:  Daily use -- up to a gram  . Sexual activity: Yes  Other Topics Concern  . Not on file  Social History Narrative   Client lives in [place]; unemployed; not actively seeking under care of psychiatrist, recently stopped attending [facility].   Social Determinants of Health   Financial Resource Strain:   . Difficulty of Paying Living Expenses: Not on file  Food Insecurity:   . Worried About Charity fundraiser in the Last Year: Not on file  . Ran Out of Food in the Last Year: Not on file  Transportation Needs:   . Lack of Transportation  (Medical): Not on file  . Lack of Transportation (Non-Medical): Not on file  Physical Activity:   . Days of Exercise per Week: Not on file  . Minutes of Exercise per Session: Not on file  Stress:   . Feeling of Stress : Not on file  Social Connections:   . Frequency of Communication with Friends and Family: Not on file  . Frequency of Social Gatherings with Friends and Family: Not on file  . Attends Religious Services: Not on file  . Active Member of Clubs or Organizations: Not on file  . Attends Archivist Meetings: Not on file  . Marital Status: Not on file   Additional Social History:    Allergies:  No Known Allergies  Labs:  Results for orders placed or performed during the hospital encounter of 11/09/19 (from the past 48 hour(s))  Rapid urine drug screen (hospital performed)     Status: Abnormal   Collection Time: 11/09/19  3:07 AM  Result Value Ref Range   Opiates NONE DETECTED NONE DETECTED   Cocaine POSITIVE (A) NONE DETECTED   Benzodiazepines NONE DETECTED NONE DETECTED   Amphetamines NONE DETECTED NONE DETECTED   Tetrahydrocannabinol NONE DETECTED NONE DETECTED   Barbiturates NONE DETECTED NONE DETECTED    Comment: (NOTE) DRUG SCREEN FOR MEDICAL PURPOSES ONLY.  IF CONFIRMATION IS NEEDED FOR ANY PURPOSE, NOTIFY LAB WITHIN 5 DAYS. LOWEST DETECTABLE LIMITS FOR URINE DRUG SCREEN Drug Class                     Cutoff (ng/mL) Amphetamine and metabolites    1000 Barbiturate and metabolites    200 Benzodiazepine                 710 Tricyclics and metabolites     300 Opiates and metabolites        300 Cocaine and metabolites        300 THC                            50 Performed at [facility], [place] [address] [place], [place] [address]   Comprehensive metabolic panel     Status: Abnormal   Collection Time: 11/09/19  3:58 AM  Result Value Ref Range   Sodium 135 135 - 145 mmol/L   Potassium 3.9 3.5 - 5.1 mmol/L   Chloride 103 98 - 111 mmol/L    CO2 21 (L) 22 - 32 mmol/L   Glucose, Bld 92 70 - 99 mg/dL   BUN 7 6 - 20 mg/dL   Creatinine, Ser 0.87 0.44 - 1.00 mg/dL   Calcium 9.4 8.9 - 10.3 mg/dL   Total Protein 6.9 6.5 - 8.1 g/dL   Albumin 3.5 3.5 - 5.0 g/dL   AST 18 15 - 41 U/L  ALT 13 0 - 44 U/L   Alkaline Phosphatase 62 38 - 126 U/L   Total Bilirubin 0.8 0.3 - 1.2 mg/dL   GFR calc non Af Amer >60 >60 mL/min   GFR calc Af Amer >60 >60 mL/min   Anion gap 11 5 - 15    Comment: Performed at [facility], [address], [place], [place] [address]  Ethanol     Status: None   Collection Time: 11/09/19  3:58 AM  Result Value Ref Range   Alcohol, Ethyl (B) <10 <10 mg/dL    Comment: (NOTE) Lowest detectable limit for serum alcohol is 10 mg/dL. For medical purposes only. Performed at [facility], [address], [place], [place] [address]   Salicylate level     Status: Abnormal   Collection Time: 11/09/19  3:58 AM  Result Value Ref Range   Salicylate Lvl <7.0 (L) 7.0 - 30.0 mg/dL    Comment: Performed at [facility], [address] [place], [place] [address]  Acetaminophen level     Status: Abnormal   Collection Time: 11/09/19  3:58 AM  Result Value Ref Range   Acetaminophen (Tylenol), Serum <10 (L) 10 - 30 ug/mL    Comment: (NOTE) Therapeutic concentrations vary significantly. A range of 10-30 ug/mL  may be an effective concentration for many patients. However, some  are best treated at concentrations outside of this range. Acetaminophen concentrations >150 ug/mL at 4 hours after ingestion  and >50 ug/mL at 12 hours after ingestion are often associated with  toxic reactions. Performed at [facility], [address], [place], [place] [address]   cbc     Status: Abnormal   Collection Time: 11/09/19  3:58 AM  Result Value Ref Range   WBC 14.1 (H) 4.0 - 10.5 K/uL   RBC 4.83 3.87 - 5.11 MIL/uL   Hemoglobin 13.7 12.0 - 15.0 g/dL   HCT 60.1 09.3 - 23.5 %   MCV 89.0 80.0 - 100.0 fL   MCH 28.4  26.0 - 34.0 pg   MCHC 31.9 30.0 - 36.0 g/dL   RDW 57.3 22.0 - 25.4 %   Platelets 451 (H) 150 - 400 K/uL   nRBC 0.0 0.0 - 0.2 %    Comment: Performed at [facility], [address] [place], [place] [address]  I-Stat beta hCG blood, ED     Status: Abnormal   Collection Time: 11/09/19  4:06 AM  Result Value Ref Range   I-stat hCG, quantitative >2,000.0 (H) <5 mIU/mL   Comment 3            Comment:   GEST. AGE      CONC.  (mIU/mL)   <=1 WEEK        5 - 50     2 WEEKS       50 - 500     3 WEEKS       100 - 10,000     4 WEEKS     1,000 - 30,000        FEMALE AND NON-PREGNANT FEMALE:     LESS THAN 5 mIU/mL   Respiratory Panel by RT PCR (Flu A&B, Covid) - Nasopharyngeal Swab     Status: None   Collection Time: 11/09/19  5:24 AM   Specimen: Nasopharyngeal Swab  Result Value Ref Range   SARS Coronavirus 2 by RT PCR NEGATIVE NEGATIVE    Comment: (NOTE) SARS-CoV-2 target nucleic acids  are NOT DETECTED. The SARS-CoV-2 RNA is generally detectable in upper respiratoy specimens during the acute phase of infection. The lowest concentration of SARS-CoV-2 viral copies this assay can detect is 131 copies/mL. A negative result does not preclude SARS-Cov-2 infection and should not be used as the sole basis for treatment or other patient management decisions. A negative result may occur with  improper specimen collection/handling, submission of specimen other than nasopharyngeal swab, presence of viral mutation(s) within the areas targeted by this assay, and inadequate number of viral copies (<131 copies/mL). A negative result must be combined with clinical observations, patient history, and epidemiological information. The expected result is Negative. Fact Sheet for Patients:  https://www.moore.com/ Fact Sheet for Healthcare Providers:  https://www.young.biz/ This test is not yet ap proved or cleared by the [place] FDA and  has been authorized  for detection and/or diagnosis of SARS-CoV-2 by FDA under an Emergency Use Authorization (EUA). This EUA will remain  in effect (meaning this test can be used) for the duration of the COVID-19 declaration under Section 564(b)(1) of the Act, 21 U.S.C. section 360bbb-3(b)(1), unless the authorization is terminated or revoked sooner.    Influenza A by PCR NEGATIVE NEGATIVE   Influenza B by PCR NEGATIVE NEGATIVE    Comment: (NOTE) The Xpert Xpress SARS-CoV-2/FLU/RSV assay is intended as an aid in  the diagnosis of influenza from Nasopharyngeal swab specimens and  should not be used as a sole basis for treatment. Nasal washings and  aspirates are unacceptable for Xpert Xpress SARS-CoV-2/FLU/RSV  testing. Fact Sheet for Patients: https://www.moore.com/ Fact Sheet for Healthcare Providers: https://www.young.biz/ This test is not yet approved or cleared by the [place] FDA and  has been authorized for detection and/or diagnosis of SARS-CoV-2 by  FDA under an Emergency Use Authorization (EUA). This EUA will remain  in effect (meaning this test can be used) for the duration of the  Covid-19 declaration under Section 564(b)(1) of the Act, 21  U.S.C. section 360bbb-3(b)(1), unless the authorization is  terminated or revoked. Performed at [facility], [address] [place], [place] [address]     Current Facility-Administered Medications  Medication Dose Route Frequency Provider Last Rate Last Admin  . acetaminophen (TYLENOL) tablet 650 mg  650 mg Oral Q6H PRN Nira Conn A, NP      . alum & mag hydroxide-simeth (MAALOX/MYLANTA) 200-200-20 MG/5ML suspension 30 mL  30 mL Oral Q4H PRN Nira Conn A, NP      . magnesium hydroxide (MILK OF MAGNESIA) suspension 30 mL  30 mL Oral Daily PRN Jackelyn Poling, NP        Musculoskeletal: Strength & Muscle Tone: within normal limits Gait & Station: normal Patient leans: N/A  Psychiatric Specialty  Exam: Physical Exam  Constitutional: She appears well-developed.  Psychiatric: She has a normal mood and affect. Her behavior is normal.    Review of Systems  Blood pressure (!) 142/80, pulse 85, temperature 98.6 F (37 C), temperature source Oral, resp. rate 16, height 4\' 11"  (1.499 m), weight 88 kg, SpO2 98 %.Body mass index is 39.18 kg/m.  General Appearance: Casual  Eye Contact:  Good  Speech:  Clear and Coherent  Volume:  Normal  Mood:  Anxious and Depressed  Affect:  Congruent  Thought Process:  Coherent  Orientation:  Full (Time, Place, and Person)  Thought Content:  Logical  Suicidal Thoughts:  Yes.  with intent/plan  Homicidal Thoughts:  No  Memory:  Immediate;   Fair Recent;  Fair  Judgement:  Fair  Insight:  Fair  Psychomotor Activity:  Normal  Concentration:  Concentration: Fair  Recall:  [employer] of Knowledge:  Fair  Language:  Fair  Akathisia:  No  Handed:  Right  AIMS (if indicated):     Assets:  Communication Skills Desire for Improvement Physical Health Resilience  ADL's:  Intact  Cognition:  WNL  Sleep:       Disposition: - Inpatient admission  -CSW to follow-up with additional outpatient resources   Oneta Rack, NP 11/10/2019 4:10 PM

## 2019-11-11 LAB — RPR: RPR Ser Ql: NONREACTIVE

## 2019-11-11 MED ORDER — ARIPIPRAZOLE 5 MG PO TABS
5.0000 mg | ORAL_TABLET | Freq: Every day | ORAL | Status: DC
  Filled 2019-11-11 (×2): qty 1

## 2019-11-11 MED ORDER — SERTRALINE HCL 50 MG PO TABS
50.0000 mg | ORAL_TABLET | Freq: Every day | ORAL | Status: DC
  Administered 2019-11-11 – 2019-11-14 (×4): 50 mg via ORAL
  Filled 2019-11-11 (×6): qty 1

## 2019-11-11 MED ORDER — PRENATAL MULTIVITAMIN CH
1.0000 | ORAL_TABLET | Freq: Every day | ORAL | Status: DC
  Administered 2019-11-11 – 2019-11-19 (×9): 1 via ORAL
  Filled 2019-11-11 (×12): qty 1

## 2019-11-11 MED ORDER — LURASIDONE HCL 20 MG PO TABS
20.0000 mg | ORAL_TABLET | Freq: Every day | ORAL | Status: DC
  Administered 2019-11-12: 09:00:00 20 mg via ORAL
  Filled 2019-11-11 (×2): qty 1

## 2019-11-11 NOTE — Progress Notes (Signed)
Recreation Therapy Notes  Date:  2.8.21 Time: 0930 Location: 300 Hall Dayroom  Group Topic: Stress Management  Goal Area(s) Addresses:  Patient will identify positive stress management techniques. Patient will identify benefits of using stress management post d/c.  Intervention: Stress Management  Activity :  Meditation.  LRT played a meditation that focused on making the most of your day and making every moment count.  Patients were to listen and follow along as meditation played to engage in activity.  Education:  Stress Management, Discharge Planning.   Education Outcome: Acknowledges Education  Clinical Observations/Feedback:  Pt did not attend group activity.    Caroll Rancher, LRT/CTRS         Lillia Abed, Ajayla Iglesias A 11/11/2019 10:50 AM

## 2019-11-11 NOTE — Progress Notes (Addendum)
[facility] MD Progress Note  11/11/2019 10:16 AM Jordan Walton  MRN:  [id number] Subjective:  Patient describes feeling " very depressed" and also reports hallucinations of demeaning nature. Currently denies suicidal ideations, contracts for safety on unit. Objective : I have discussed case with treatment team and have met with patient. 29 year old female, presented to ED reporting worsening depression, suicidal ideations, neuro-vegetative symptoms, demeaning auditory hallucinations , daily alcohol ( up to 15 beers per day) and cocaine use . She was found to be pregnant in ED. Patient reports LMP was in early January.  Significant psychosocial stressors include homelessness, poor support network.  History of prior psychiatric admissions, most recently in September 2020 for similar presentation- has been diagnosed with Substance Induced Mood Disorder. Had not been taking psychiatric medications prior to admission.  She continues to report depression and endorses auditory hallucinations . Currently  presents depressed, with a constricted/vaguely irritable affect. Does not appear internally preoccupied at this time. Denies medication side effects .  She presents with mild distal tremors, no diaphoresis, no restlessness or psychomotor  agitation . She is interested in starting medications to address depression and hallucinations. We have reviewed risk/benefit considerations in the context of pregnancy, and have reviewed with pharmacist to elect medications felt to have a more favorable safety profile during pregnancy.  Limited milieu or group participation at this time, spends most time in bed at this time Labs ordered , currently pending .  Principal Problem: Substance induced mood disorder (HCC) Diagnosis: Principal Problem:   Substance induced mood disorder (HCC) Active Problems:   Schizoaffective disorder, bipolar type ([place])  Total Time spent with patient: 20 minutes  Past Psychiatric  History:   Past Medical History:  Past Medical History:  Diagnosis Date  . Crack cocaine use    History reviewed. No pertinent surgical history. Family History: History reviewed. No pertinent family history. Family Psychiatric  History:  Social History:  Social History   Substance and Sexual Activity  Alcohol Use Yes  . Alcohol/week: 6.0 - 12.0 standard drinks  . Types: 6 - 12 Cans of beer per week     Social History   Substance and Sexual Activity  Drug Use Yes  . Frequency: 7.0 times per week  . Types: "Crack" cocaine   Comment: Daily use -- up to a gram    Social History   Socioeconomic History  . Marital status: Single    Spouse name: Not on file  . Number of children: Not on file  . Years of education: Not on file  . Highest education level: Not on file  Occupational History  . Occupation: Unemployed  Tobacco Use  . Smoking status: Current Every Day Smoker    Types: Cigarettes  . Smokeless tobacco: Never Used  Substance and Sexual Activity  . Alcohol use: Yes    Alcohol/week: 6.0 - 12.0 standard drinks    Types: 6 - 12 Cans of beer per week  . Drug use: Yes    Frequency: 7.0 times per week    Types: "Crack" cocaine    Comment: Daily use -- up to a gram  . Sexual activity: Yes  Other Topics Concern  . Not on file  Social History Narrative   Client lives in [place]; unemployed; not actively seeking under care of psychiatrist, recently stopped attending [facility].   Social Determinants of Health   Financial Resource Strain:   . Difficulty of Paying Living Expenses: Not on file  Food Insecurity:   .  Worried About Charity fundraiser in the Last Year: Not on file  . Ran Out of Food in the Last Year: Not on file  Transportation Needs:   . Lack of Transportation (Medical): Not on file  . Lack of Transportation (Non-Medical): Not on file  Physical Activity:   . Days of Exercise per Week: Not on file  . Minutes of Exercise per Session: Not on file   Stress:   . Feeling of Stress : Not on file  Social Connections:   . Frequency of Communication with Friends and Family: Not on file  . Frequency of Social Gatherings with Friends and Family: Not on file  . Attends Religious Services: Not on file  . Active Member of Clubs or Organizations: Not on file  . Attends Archivist Meetings: Not on file  . Marital Status: Not on file   Additional Social History:   Sleep: Good  Appetite:  Fair  Current Medications: Current Facility-Administered Medications  Medication Dose Route Frequency Provider Last Rate Last Admin  . folic acid (FOLVITE) tablet 1 mg  1 mg Oral Daily Robinn Overholt A, MD   1 mg at 11/11/19 0900  . LORazepam (ATIVAN) tablet 1 mg  1 mg Oral Q6H PRN [place] Ciavarella, Myer Peer, MD   1 mg at 11/10/19 2208  . prenatal multivitamin tablet 1 tablet  1 tablet Oral Daily Sonnia Strong, Myer Peer, MD   1 tablet at 11/11/19 0900  . thiamine (B-1) injection 100 mg  100 mg Intramuscular Once Mckyla Deckman A, MD      . thiamine tablet 100 mg  100 mg Oral Daily Kamila Broda, Myer Peer, MD   100 mg at 11/11/19 0900    Lab Results: No results found for this or any previous visit (from the past 48 hour(s)).  Blood Alcohol level:  Lab Results  Component Value Date   ETH <10 11/09/2019   ETH <10 66/59/9357    Metabolic Disorder Labs: Lab Results  Component Value Date   HGBA1C 6.3 (H) 04/13/2019   MPG 134.11 04/13/2019   No results found for: PROLACTIN Lab Results  Component Value Date   CHOL 191 04/13/2019   TRIG 125 04/13/2019   HDL 64 04/13/2019   CHOLHDL 3.0 04/13/2019   VLDL 25 04/13/2019   LDLCALC 102 (H) 04/13/2019    Physical Findings: AIMS: Facial and Oral Movements Muscles of Facial Expression: None, normal Lips and Perioral Area: None, normal Jaw: None, normal Tongue: None, normal,Extremity Movements Upper (arms, wrists, hands, fingers): None, normal Lower (legs, knees, ankles, toes): None, normal, Trunk  Movements Neck, shoulders, hips: None, normal, Overall Severity Severity of abnormal movements (highest score from questions above): None, normal Incapacitation due to abnormal movements: None, normal Patient's awareness of abnormal movements (rate only patient's report): No Awareness, Dental Status Current problems with teeth and/or dentures?: No Does patient usually wear dentures?: No  CIWA:  CIWA-Ar Total: 0 COWS:  COWS Total Score: 1  Musculoskeletal: Strength & Muscle Tone: within normal limits mild distal tremors, no psychomotor agitation or restlessness, no diaphoresis Gait & Station: normal Patient leans: N/A  Psychiatric Specialty Exam: Physical Exam  Review of Systems denies headache, no chest pain, no shortness of breath, denies cramping or any vaginal bleeding , no fever or chills  Blood pressure (!) 142/80, pulse 85, temperature 98.6 F (37 C), temperature source Oral, resp. rate 16, height _0  (1.499 m), weight 88 kg, SpO2 98 %.Body mass index is 39.18 kg/m.  General  Appearance: Fairly Groomed  Eye Contact:  Fair  Speech:  Normal Rate  Volume:  Decreased  Mood:  Depressed and Dysphoric  Affect:  Congruent  Thought Process:  Linear and Descriptions of Associations: Intact  Orientation:  Other:  alert and attentive, oriented to February 2021, and to " [facility] [employer] Content:  reports intermittent auditory hallucinations, does not currently present overtly internally preoccupied, no delusions expressed   Suicidal Thoughts:  No currently denies SI and contracts for safety on unit   Homicidal Thoughts:  No  Memory:  recent and remote fair   Judgement:  Fair  Insight:  Fair  Psychomotor Activity:  Decreased- mild distal tremors   Concentration:  Concentration: Fair and Attention Span: Fair  Recall:  [employer] of Knowledge:  Fair  Language:  Fair  Akathisia:  Negative  Handed:  Right  AIMS (if indicated):     Assets:  Desire for  Improvement Resilience  ADL's:  Intact  Cognition:  WNL  Sleep:  Number of Hours: 6.25   Assessment -  29 year old female, presented to ED reporting worsening depression, suicidal ideations, neuro-vegetative symptoms, demeaning auditory hallucinations , daily alcohol ( up to 15 beers per day) and cocaine use . She was found to be pregnant in ED. Patient reports LMP was in early January.  Significant psychosocial stressors include homelessness, poor support network.  History of prior psychiatric admissions, most recently in September 2020 for similar presentation- has been diagnosed with Substance Induced Mood Disorder. Had not been taking psychiatric medications prior to admission.  Patient remains depressed, with constricted/labile affect , reporting auditory hallucinations. Currently not overtly internally preoccupied or thought disordered. Other than some distal tremors, she is not presenting with significant alcohol WDL symptoms at this time. BP 127/73, pulse 93.  Patient is expressing interest in starting medication management for depression/mood and for psychotic symptoms. Risk/benefit considerations have been reviewed . I have reviewed case with treatment team and pharmacist . Zoloft and Latuda considered as having a generally favorable pregnancy risk profile. Patient made aware of potential risks, to include possible third trimester/perinatal risks such as neonatal WDL.  Treatment Plan Summary: Daily contact with patient to assess and evaluate symptoms and progress in treatment, Medication management, Plan inpatient treatment  and medications as below Encourage group and milieu participation Encourage efforts to work on sobriety and relapse prevention Treatment team working on disposition planning options- patient currently homeless  Continue Ativan PRN for alcohol WDL as needed per CIWA protocol. Also thiamine , folate  Continue prenatal vitamin daily Start Zoloft 50 mgrs QDAY for  depression Start Latuda 20 mgrs QDAY for mood disorder and psychosis Labs ordered, currently pending   Jenne Campus, MD 11/11/2019, 10:16 AM

## 2019-11-11 NOTE — [facility] Group Notes (Signed)
LCSW Group Therapy Notes  Type of Therapy and Topic: Group Therapy: Healthy Vs. Unhealthy Coping Strategies  Participation Level: [facility] PARTICIPATION LEVEL: Active  Description of Group: In this group, patients will be encouraged to explore their healthy and unhealthy coping strategics. Coping strategies are actions that we take to deal with stress, problems, or uncomfortable emotions in our daily lives. Each patient will be challenged to read some scenarios and discuss the unhealthy and healthy coping strategies within those scenarios. Also, each patient will be challenged to describe current healthy and unhealthy strategies that they use in their own lives and discuss the outcomes and barriers to those strategies. This group will be process-oriented, with patients participating in exploration of their own experiences as well as giving and receiving support and challenge from other group members.  Therapeutic Goals: Patient will identify personal healthy and unhealthy coping strategies. Patient will identify healthy and unhealthy coping strategies, in others, through scenarios. Patient will identify expected outcomes of healthy and unhealthy coping strategies. Patient will identify barriers to using healthy coping strategies.  Summary of Patient Progress:  Due to the COVID-19 pandemic and acuity of the unit, this group has been supplemented with worksheets.  Therapeutic Modalities:  Cognitive Behavioral Therapy Solution Focused Therapy Motivational Interviewing 

## 2019-11-11 NOTE — Tx Team (Signed)
Interdisciplinary Treatment and Diagnostic Plan Update  11/11/2019 Time of Session:  Jordan Walton MRN: [id number]  Principal Diagnosis: Substance induced mood disorder ([place])  Secondary Diagnoses: Principal Problem:   Substance induced mood disorder ([place]) Active Problems:   Schizoaffective disorder, bipolar type ([place])   Current Medications:  Current Facility-Administered Medications  Medication Dose Route Frequency Provider Last Rate Last Admin  . folic acid (FOLVITE) tablet 1 mg  1 mg Oral Daily Cobos, Fernando A, MD   1 mg at 11/11/19 0900  . LORazepam (ATIVAN) tablet 1 mg  1 mg Oral Q6H PRN Cobos, Myer Peer, MD   1 mg at 11/10/19 2208  . prenatal multivitamin tablet 1 tablet  1 tablet Oral Daily Cobos, Myer Peer, MD   1 tablet at 11/11/19 0900  . thiamine (B-1) injection 100 mg  100 mg Intramuscular Once Cobos, Fernando A, MD      . thiamine tablet 100 mg  100 mg Oral Daily Cobos, Myer Peer, MD   100 mg at 11/11/19 0900   PTA Medications: No medications prior to admission.    Patient Stressors:    Patient Strengths:    Treatment Modalities: Medication Management, Group therapy, Case management,  1 to 1 session with clinician, Psychoeducation, Recreational therapy.   Physician Treatment Plan for Primary Diagnosis: Substance induced mood disorder ([place]) Long Term Goal(s): Improvement in symptoms so as ready for discharge Improvement in symptoms so as ready for discharge   Short Term Goals: Ability to identify changes in lifestyle to reduce recurrence of condition will improve Ability to identify triggers associated with substance abuse/mental health issues will improve Ability to identify changes in lifestyle to reduce recurrence of condition will improve Ability to verbalize feelings will improve Ability to disclose and discuss suicidal ideas Ability to demonstrate self-control will improve Ability to identify and develop effective coping behaviors will  improve Ability to maintain clinical measurements within normal limits will improve  Medication Management: Evaluate patient's response, side effects, and tolerance of medication regimen.  Therapeutic Interventions: 1 to 1 sessions, Unit Group sessions and Medication administration.  Evaluation of Outcomes: Not Met  Physician Treatment Plan for Secondary Diagnosis: Principal Problem:   Substance induced mood disorder ([place]) Active Problems:   Schizoaffective disorder, bipolar type ([place])  Long Term Goal(s): Improvement in symptoms so as ready for discharge Improvement in symptoms so as ready for discharge   Short Term Goals: Ability to identify changes in lifestyle to reduce recurrence of condition will improve Ability to identify triggers associated with substance abuse/mental health issues will improve Ability to identify changes in lifestyle to reduce recurrence of condition will improve Ability to verbalize feelings will improve Ability to disclose and discuss suicidal ideas Ability to demonstrate self-control will improve Ability to identify and develop effective coping behaviors will improve Ability to maintain clinical measurements within normal limits will improve     Medication Management: Evaluate patient's response, side effects, and tolerance of medication regimen.  Therapeutic Interventions: 1 to 1 sessions, Unit Group sessions and Medication administration.  Evaluation of Outcomes: Not Met   RN Treatment Plan for Primary Diagnosis: Substance induced mood disorder ([place]) Long Term Goal(s): Knowledge of disease and therapeutic regimen to maintain health will improve  Short Term Goals: Ability to demonstrate self-control, Ability to participate in decision making will improve, Ability to verbalize feelings will improve, Ability to identify and develop effective coping behaviors will improve and Compliance with prescribed medications will improve  Medication Management:  RN will administer medications  as ordered by provider, will assess and evaluate patient's response and provide education to patient for prescribed medication. RN will report any adverse and/or side effects to prescribing provider.  Therapeutic Interventions: 1 on 1 counseling sessions, Psychoeducation, Medication administration, Evaluate responses to treatment, Monitor vital signs and CBGs as ordered, Perform/monitor CIWA, COWS, AIMS and Fall Risk screenings as ordered, Perform wound care treatments as ordered.  Evaluation of Outcomes: Not Met   LCSW Treatment Plan for Primary Diagnosis: Substance induced mood disorder ([place]) Long Term Goal(s): Safe transition to appropriate next level of care at discharge, Engage patient in therapeutic group addressing interpersonal concerns.  Short Term Goals: Engage patient in aftercare planning with referrals and resources  Therapeutic Interventions: Assess for all discharge needs, 1 to 1 time with Social worker, Explore available resources and support systems, Assess for adequacy in community support network, Educate family and significant other(s) on suicide prevention, Complete Psychosocial Assessment, Interpersonal group therapy.  Evaluation of Outcomes: Not Met   Progress in Treatment: Attending groups: No. New to unit  Participating in groups: No. Taking medication as prescribed: Yes. Toleration medication: Yes. Family/Significant other contact made: No, will contact:  if patient consents to collateral contacts Patient understands diagnosis: Yes. Discussing patient identified problems/goals with staff: Yes. Medical problems stabilized or resolved: Yes. Denies suicidal/homicidal ideation: Yes. Issues/concerns per patient self-inventory: No. Other:   New problem(s) identified:None   New Short Term/Long Term Goal(s): Detox, medication stabilization, elimination of SI thoughts, development of comprehensive mental wellness plan.    Patient  Goals: Patient declined to meet at this time.   Discharge Plan or Barriers: Patient recently admitted. CSW will continue to follow and assess for appropriate referrals and possible discharge planning.    Reason for Continuation of Hospitalization: Aggression Anxiety Depression Medication stabilization Suicidal ideation Withdrawal symptoms  Estimated Length of Stay: 3-5 days   Attendees: Patient:  Patient declined to meet at this time.  11/11/2019 9:47 AM  Physician: Dr. Neita Garnet, MD 11/11/2019 9:47 AM  Nursing:  11/11/2019 9:47 AM  RN Care Manager: 11/11/2019 9:47 AM  Social Worker: Radonna Ricker, LCSW 11/11/2019 9:47 AM  Recreational Therapist:  11/11/2019 9:47 AM  Other:  11/11/2019 9:47 AM  Other:  11/11/2019 9:47 AM  Other: 11/11/2019 9:47 AM    Scribe for Treatment Team: Marylee Floras, [place] 11/11/2019 9:47 AM

## 2019-11-11 NOTE — Progress Notes (Signed)
Patient denied HI.  Suicidal thoughts off/on, contracts for safety.  Does hear people tell her she is no good and to hurt herself at times.  Does see shadows occasionally. Medications administered per MD orders.  Emotional support and encouragement given patient.   Safety maintained with 15 minute checks.  Emotional support and encouragement given patient.

## 2019-11-11 NOTE — Progress Notes (Signed)
Spiritual care group on grief and loss facilitated by chaplain Zeniah Briney  Group Goal:  Support / Education around grief and loss Members engage in facilitated group support and psycho-social education.  Group Description:  Following introductions and group rules, group members engaged in facilitated group dialog and support around topic of loss, with particular support around experiences of loss in their lives. Group Identified types of loss (relationships / self / things) and identified patterns, circumstances, and changes that precipitate losses. Reflected on thoughts / feelings around loss, normalized grief responses, and recognized variety in grief experience. Patient Progress: Did not attend  

## 2019-11-11 NOTE — [facility] Group Notes (Signed)
Pt did not attend wrap up group this evening. Pt was in bed resting. 

## 2019-11-11 NOTE — Progress Notes (Signed)
Patient's dad Jordan Walton phone [phone] would like call 11/12/2019 about patient's status, etc.   Patient is aware

## 2019-11-11 NOTE — Plan of Care (Signed)
Nurse discussed anxiety, depression and coping skills with patient.  

## 2019-11-11 NOTE — Progress Notes (Signed)
   11/11/19 2200  Psych Admission Type (Psych Patients Only)  Admission Status Voluntary  Psychosocial Assessment  Patient Complaints Anxiety  Eye Contact Brief  Facial Expression Flat  Affect Appropriate to circumstance  Speech Logical/coherent  Interaction Assertive  Motor Activity Other (Comment) (WDL)  Appearance/Hygiene In scrubs  Behavior Characteristics Anxious  Mood Anxious  Thought Process  Coherency WDL  Content WDL  Delusions None reported or observed  Perception WDL  Hallucination Auditory  Judgment Poor  Danger to Self  Current suicidal ideation? Denies  Self-Injurious Behavior No self-injurious ideation or behavior indicators observed or expressed   Agreement Not to Harm Self Yes  Description of Agreement Verbal  Danger to Others  Danger to Others None reported or observed

## 2019-11-12 ENCOUNTER — Inpatient Hospital Stay (HOSPITAL_COMMUNITY): Payer: Federal, State, Local not specified - Other

## 2019-11-12 LAB — HIV-1 RNA QUANT-NO REFLEX-BLD
HIV 1 RNA Quant: <20 copies/mL
LOG10 HIV-1 RNA: UNDETERMINED log10copy/mL

## 2019-11-12 LAB — GC/CHLAMYDIA PROBE AMP (~~LOC~~) NOT AT ARMC
Chlamydia: NEGATIVE
Comment: NEGATIVE
Comment: NORMAL
Neisseria Gonorrhea: NEGATIVE

## 2019-11-12 MED ORDER — LURASIDONE HCL 40 MG PO TABS
40.0000 mg | ORAL_TABLET | Freq: Every day | ORAL | Status: DC
  Administered 2019-11-13 – 2019-11-14 (×2): 40 mg via ORAL
  Filled 2019-11-12 (×4): qty 1

## 2019-11-12 MED ORDER — HYDROXYZINE HCL 50 MG PO TABS
50.0000 mg | ORAL_TABLET | Freq: Every evening | ORAL | Status: DC | PRN
  Administered 2019-11-13 – 2019-11-18 (×6): 50 mg via ORAL
  Filled 2019-11-12 (×7): qty 1

## 2019-11-12 MED ORDER — HYDROXYZINE HCL 25 MG PO TABS
25.0000 mg | ORAL_TABLET | Freq: Four times a day (QID) | ORAL | Status: DC | PRN
  Administered 2019-11-13 – 2019-11-16 (×3): 25 mg via ORAL
  Filled 2019-11-12 (×4): qty 1

## 2019-11-12 NOTE — Progress Notes (Signed)
Psychoeducational Group Note  Date:  11/12/2019 Time:  2112  Group Topic/Focus:  Wrap-Up Group:   The focus of this group is to help patients review their daily goal of treatment and discuss progress on daily workbooks.  Participation Level: Did Not Attend  Participation Quality:  Not Applicable  Affect:  Not Applicable  Cognitive:  Not Applicable  Insight:  Not Applicable  Engagement in Group: Not Applicable  Additional Comments:  The patient did not attend group this evening.   Hazle Coca S 11/12/2019, 9:12 PM

## 2019-11-12 NOTE — [facility] Suicide Risk Assessment (Signed)
[facility] INPATIENT:  Family/Significant Other Suicide Prevention Education  Suicide Prevention Education:  Education Completed; with father, Rex Magee [phone]) has been identified by the patient as the family member/significant other with whom the patient will be residing, and identified as the person(s) who will aid the patient in the event of a mental health crisis (suicidal ideations/suicide attempt).  With written consent from the patient, the family member/significant other has been provided the following suicide prevention education, prior to the and/or following the discharge of the patient.  The suicide prevention education provided includes the following:  Suicide risk factors  Suicide prevention and interventions  National Suicide Hotline telephone number  [facility] assessment telephone number  [facility] Emergency Assistance 911  [facility] and/or Residential Mobile Crisis Unit telephone number  Request made of family/significant other to:  Remove weapons (e.g., guns, rifles, knives), all items previously/currently identified as safety concern.    Remove drugs/medications (over-the-counter, prescriptions, illicit drugs), all items previously/currently identified as a safety concern.  The family member/significant other verbalizes understanding of the suicide prevention education information provided.  The family member/significant other agrees to remove the items of safety concern listed above.  Patient's father reports he is very concerned about his daughter. He reports he was aware of his daughter's early pregnancy and would like her to have a "Secure" discharge plan" for the patient and her unborn child's safety. He shared that the patient has struggled with addiction for many years. He currently has custody of the patient's 11 year old daughter.   Patient's father states hew would also like to see the patient discharge to a residential  treatment program. Patient's father states he will continue to be supportive. There was no further questions or concerns at this time.    Jordan Walton 11/12/2019, 1:12 PM
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
## 2019-11-12 NOTE — Progress Notes (Deleted)
   11/12/19 2300  Psych Admission Type (Psych Patients Only)  Admission Status Voluntary  Psychosocial Assessment  Patient Complaints Anxiety  Eye Contact Brief  Facial Expression Flat  Affect Appropriate to circumstance  Speech Logical/coherent  Interaction Assertive  Motor Activity Other (Comment) (WDL)  Appearance/Hygiene In scrubs  Behavior Characteristics Anxious  Mood Anxious  Thought Process  Coherency WDL  Content WDL  Delusions None reported or observed  Perception WDL  Hallucination Auditory  Judgment Poor  Danger to Self  Current suicidal ideation? Denies  Self-Injurious Behavior No self-injurious ideation or behavior indicators observed or expressed   Agreement Not to Harm Self Yes  Description of Agreement Verbal  Danger to Others  Danger to Others None reported or observed

## 2019-11-12 NOTE — Progress Notes (Signed)
Recreation Therapy Notes  Animal-Assisted Activity (AAA) Program Checklist/Progress Notes Patient Eligibility Criteria Checklist & Daily Group note for Rec Tx Intervention  Date: 2.9.21 Time: 1430 Location: [address]   AAA/T Program Assumption of Risk Form signed by Engineer, production or Parent Legal Guardian  YES   Patient is free of allergies or sever asthma  YES  Patient reports no fear of animals  YES   Patient reports no history of cruelty to animals  YES  Patient understands his/her participation is voluntary  YES  Patient washes hands before animal contact  YES   Patient washes hands after animal contact   YES  Education: Charity fundraiser, Appropriate Animal Interaction   Education Outcome: Acknowledges understanding/In group clarification offered/Needs additional education.   Clinical Observations/Feedback: Pt did not attend activity.    Caroll Rancher, LRT/CTRS         Caroll Rancher A 11/12/2019 3:36 PM

## 2019-11-12 NOTE — Progress Notes (Signed)
Patient denied SI and HI, contracts for safety.  Denied A/V hallucinations.  Denied pain. Medications administered per MD orders.  Emotional support and encouragement given patient. Safety maintained with 15 minute checks.   

## 2019-11-12 NOTE — [facility] Counselor (Signed)
[facility] is reviewing patient for residential substance use treatment. [facility] is requesting ultrasound scans for their admissions decision, as patient is pregnant.  Enid Cutter, MSW, LCSW-A Clinical Social Worker [facility]  [phone]

## 2019-11-12 NOTE — Progress Notes (Signed)
Jordan Walton Progress Note  11/12/2019 10:38 AM Jordan Walton  MRN:  [id number] Subjective: Patient is a 29 year old female with a past psychiatric history significant for substance-induced mood disorder, alcohol dependence, cocaine dependence who was admitted on 11/11/2019 secondary to depression, anxiety, homelessness and recent discovery of pregnancy.  Patient is seen and examined.  Patient is a 29 year old female with the above-stated past psychiatric history who is seen in follow-up.  She stated she did not sleep well last night.  She stated that she feels tremulous and is well "twitchy".  She stated that the medications were not helping with this.  She is currently on lorazepam, Latuda, and sertraline.  Her vital signs are stable, she is afebrile.  Her most recent CIWA this a.m. was 1.  Nursing notes reflect that she slept 4.75 hours last night.  We also discussed her application to get into [facility].  Review of her admission laboratories revealed a mildly low bicarb at 21, a mildly elevated white blood cell count at 14.1, elevated platelets at 451,000.  Her urine pregnancy test was greater than 2000.  RPR was negative.  Blood alcohol on admission was less than 10.  Drug screen was positive for cocaine.  We do not have an EKG on her.  Principal Problem: Substance induced mood disorder (HCC) Diagnosis: Principal Problem:   Substance induced mood disorder (HCC) Active Problems:   Schizoaffective disorder, bipolar type (HCC)  Total Time spent with patient: 30 minutes  Past Psychiatric History: See admission H&P  Past Medical History:  Past Medical History:  Diagnosis Date  . Crack cocaine use    History reviewed. No pertinent surgical history. Family History: History reviewed. No pertinent family history. Family Psychiatric  History: See admission H&P Social History:  Social History   Substance and Sexual Activity  Alcohol Use Yes  . Alcohol/week: 6.0 - 12.0 standard drinks  . Types: 6 -  12 Cans of beer per week     Social History   Substance and Sexual Activity  Drug Use Yes  . Frequency: 7.0 times per week  . Types: "Crack" cocaine   Comment: Daily use -- up to a gram    Social History   Socioeconomic History  . Marital status: Single    Spouse name: Not on file  . Number of children: Not on file  . Years of education: Not on file  . Highest education level: Not on file  Occupational History  . Occupation: Unemployed  Tobacco Use  . Smoking status: Current Every Day Smoker    Types: Cigarettes  . Smokeless tobacco: Never Used  Substance and Sexual Activity  . Alcohol use: Yes    Alcohol/week: 6.0 - 12.0 standard drinks    Types: 6 - 12 Cans of beer per week  . Drug use: Yes    Frequency: 7.0 times per week    Types: "Crack" cocaine    Comment: Daily use -- up to a gram  . Sexual activity: Yes  Other Topics Concern  . Not on file  Social History Narrative   Client lives in [place]; unemployed; not actively seeking under care of psychiatrist, recently stopped attending [facility].   Social Determinants of Health   Financial Resource Strain:   . Difficulty of Paying Living Expenses: Not on file  Food Insecurity:   . Worried About Programme researcher, broadcasting/film/video in the Last Year: Not on file  . Ran Out of Food in the Last Year: Not on file  Transportation  Needs:   . Lack of Transportation (Medical): Not on file  . Lack of Transportation (Non-Medical): Not on file  Physical Activity:   . Days of Exercise per Week: Not on file  . Minutes of Exercise per Session: Not on file  Stress:   . Feeling of Stress : Not on file  Social Connections:   . Frequency of Communication with Friends and Family: Not on file  . Frequency of Social Gatherings with Friends and Family: Not on file  . Attends Religious Services: Not on file  . Active Member of Clubs or Organizations: Not on file  . Attends Banker Meetings: Not on file  . Marital Status: Not on file    Additional Social History:                         Sleep: Poor  Appetite:  Fair  Current Medications: Current Facility-Administered Medications  Medication Dose Route Frequency Provider Last Rate Last Admin  . folic acid (FOLVITE) tablet 1 mg  1 mg Oral Daily Cobos, Fernando A, Walton   1 mg at 11/12/19 0900  . hydrOXYzine (ATARAX/VISTARIL) tablet 25 mg  25 mg Oral Q6H PRN Antonieta Pert, Walton      . hydrOXYzine (ATARAX/VISTARIL) tablet 50 mg  50 mg Oral QHS PRN Antonieta Pert, Walton      . LORazepam (ATIVAN) tablet 1 mg  1 mg Oral Q6H PRN Cobos, Rockey Situ, Walton   1 mg at 11/11/19 1850  . [START ON 11/13/2019] lurasidone (LATUDA) tablet 40 mg  40 mg Oral Q breakfast Antonieta Pert, Walton      . prenatal multivitamin tablet 1 tablet  1 tablet Oral Daily Cobos, Rockey Situ, Walton   1 tablet at 11/12/19 0900  . sertraline (ZOLOFT) tablet 50 mg  50 mg Oral Daily Cobos, Rockey Situ, Walton   50 mg at 11/12/19 0900  . thiamine tablet 100 mg  100 mg Oral Daily Cobos, Rockey Situ, Walton   100 mg at 11/12/19 0900    Lab Results:  Results for orders placed or performed during the hospital encounter of 11/09/19 (from the past 48 hour(s))  RPR     Status: None   Collection Time: 11/11/19  6:12 AM  Result Value Ref Range   RPR Ser Ql NON REACTIVE NON REACTIVE    Comment: Performed at [facility], [address] [place], [place] [address]    Blood Alcohol level:  Lab Results  Component Value Date   [facility] <10 11/09/2019   ETH <10 09/09/2019    Metabolic Disorder Labs: Lab Results  Component Value Date   HGBA1C 6.3 (H) 04/13/2019   MPG 134.11 04/13/2019   No results found for: PROLACTIN Lab Results  Component Value Date   CHOL 191 04/13/2019   TRIG 125 04/13/2019   HDL 64 04/13/2019   CHOLHDL 3.0 04/13/2019   VLDL 25 04/13/2019   LDLCALC 102 (H) 04/13/2019    Physical Findings: AIMS: Facial and Oral Movements Muscles of Facial Expression: None, normal Lips and Perioral  Area: None, normal Jaw: None, normal Tongue: None, normal,Extremity Movements Upper (arms, wrists, hands, fingers): None, normal Lower (legs, knees, ankles, toes): None, normal, Trunk Movements Neck, shoulders, hips: None, normal, Overall Severity Severity of abnormal movements (highest score from questions above): None, normal Incapacitation due to abnormal movements: None, normal Patient's awareness of abnormal movements (rate only patient's report): No Awareness, Dental Status Current  problems with teeth and/or dentures?: No Does patient usually wear dentures?: No  CIWA:  CIWA-Ar Total: 1 COWS:  COWS Total Score: 2  Musculoskeletal: Strength & Muscle Tone: within normal limits Gait & Station: normal Patient leans: N/A  Psychiatric Specialty Exam: Physical Exam  Nursing note and vitals reviewed. Constitutional: She is oriented to person, place, and time. She appears well-developed and well-nourished.  HENT:  Head: Normocephalic and atraumatic.  Respiratory: Effort normal.  Neurological: She is alert and oriented to person, place, and time.    Review of Systems  Blood pressure 127/73, pulse 93, temperature 98.6 F (37 C), temperature source Oral, resp. rate 16, height 4\' 11"  (1.499 m), weight 88 kg, SpO2 98 %.Body mass index is 39.18 kg/m.  General Appearance: Disheveled  Eye Contact:  Fair  Speech:  Normal Rate  Volume:  Normal  Mood:  Anxious and Dysphoric  Affect:  Congruent  Thought Process:  Coherent and Descriptions of Associations: Intact  Orientation:  Full (Time, Place, and Person)  Thought Content:  Logical  Suicidal Thoughts:  Yes.  without intent/plan  Homicidal Thoughts:  No  Memory:  Immediate;   Fair Recent;   Fair Remote;   Fair  Judgement:  Intact  Insight:  Lacking  Psychomotor Activity:  Increased  Concentration:  Concentration: Fair and Attention Span: Fair  Recall:  of Knowledge:  Fair  Language:  Good  Akathisia:  Negative   Handed:  Right  AIMS (if indicated):     Assets:  Desire for Improvement Resilience  ADL's:  Intact  Cognition:  WNL  Sleep:  Number of Hours: 4.75     Treatment Plan Summary: Daily contact with patient to assess and evaluate symptoms and progress in treatment, Medication management and Plan : Patient is seen and examined.  Patient is a 29 year old female with the above-stated past psychiatric history who is seen in follow-up.   Diagnosis: #1 substance-induced mood disorder, #2 alcohol withdrawal, #3 alcohol dependence, #4 cocaine dependence, #5 pregnancy  Patient is seen in follow-up.  Patient complains of racing thoughts, tremors, jitteriness.  Her vital signs are stable.  She does have lorazepam available 1 mg p.o. every 6 hours as needed a CIWA greater than 10.  I will write for hydroxyzine 25 mg p.o. every 6 hours as needed anxiety.  And as well she can have 50 mg p.o. nightly as needed insomnia.  She will continue the Zoloft at 50 mg p.o. daily.  I will increase her Latuda to 40 mg p.o. breakfast.  She also remains on a prenatal vitamin as well as thiamine and folic acid.  She did have an RPR done in the emergency department, her white count was elevated on admission, and I will recheck that.  That also goes for her platelet count.  Her CBC from 2 months ago showed a white blood cell count of 11.1 and platelets of 472,000.  6 months ago she was at 419,000, but this normalized.  We are waiting for a bed for her for substance abuse treatment, and hopefully we can get her into treatment.  1.  Continue folic acid 1 mg p.o. daily for nutritional supplementation. 2.  Add hydroxyzine 25 mg p.o. every 6 hours as needed anxiety. 3.  Add hydroxyzine 50 mg p.o. nightly as needed insomnia. 4.  Continue lorazepam 1 mg p.o. every 6 hours as needed a CIWA greater than 10. 5.  Increase Latuda to 40 mg p.o. daily with breakfast for racing  thoughts. 6.  Continue a prenatal vitamin 1 tablet p.o. daily for  nutritional supplementation. 7.  Continue Zoloft 50 mg p.o. daily for anxiety and depression. 8.  Continue thiamine 100 mg p.o. daily for nutritional supplementation. 9.  Disposition planning-in progress.  Sharma Covert, Walton 11/12/2019, 10:38 AM

## 2019-11-12 NOTE — [facility] Counselor (Signed)
CSW called and spoke with admissions at [facility]. Admissions confirms receipt of referral, but the referral has not been reviewed at this time.   Enid Cutter, MSW, LCSW-A Clinical Social Worker [facility] Adult [facility]  [phone]

## 2019-11-13 LAB — CBC WITH DIFFERENTIAL/PLATELET
Abs Immature Granulocytes: 0.04 10*3/uL (ref 0.00–0.07)
Basophils Absolute: 0.1 10*3/uL (ref 0.0–0.1)
Basophils Relative: 0 %
Eosinophils Absolute: 0.2 10*3/uL (ref 0.0–0.5)
Eosinophils Relative: 2 %
HCT: 43.3 % (ref 36.0–46.0)
Hemoglobin: 13.7 g/dL (ref 12.0–15.0)
Immature Granulocytes: 0 %
Lymphocytes Relative: 32 %
Lymphs Abs: 3.7 10*3/uL (ref 0.7–4.0)
MCH: 28.4 pg (ref 26.0–34.0)
MCHC: 31.6 g/dL (ref 30.0–36.0)
MCV: 89.6 fL (ref 80.0–100.0)
Monocytes Absolute: 0.7 10*3/uL (ref 0.1–1.0)
Monocytes Relative: 6 %
Neutro Abs: 7.1 10*3/uL (ref 1.7–7.7)
Neutrophils Relative %: 60 %
Platelets: 398 10*3/uL (ref 150–400)
RBC: 4.83 MIL/uL (ref 3.87–5.11)
RDW: 15 % (ref 11.5–15.5)
WBC: 11.8 10*3/uL — ABNORMAL HIGH (ref 4.0–10.5)
nRBC: 0 % (ref 0.0–0.2)

## 2019-11-13 NOTE — Progress Notes (Signed)
[facility] MD Progress Note  11/13/2019 10:13 AM Jordan Walton  MRN:  [id number]  Subjective: Kieley reports, "I had an OBGYN appointment yesterday. I'm waiting to hear what the result is, if I'm pregnant or not?" When one of the providers informed patient that the result of the ultra-sound had indicated that she is pregnant, eight (+) weeks along. Patient got very upset wondering how this provider came to know about her pregnancy because the attending psychiatrist was to tell if she is pregnant or not. Patient also says she does not want to be asked questions about the reasons for her psychiatric hospitalizations. She yelled, caused one of the providers by calling this provider a "bitch". She banged her door, came out on the hall-way yelling & causing out loudly. She is currently in her room with the door closed. She is being monitored closely for safety.  Objective:  Patient is a 29 year old female with a past psychiatric history significant for substance-induced mood disorder, alcohol dependence, cocaine dependence who was admitted on 11/11/2019 secondary to depression, anxiety, homelessness & recent discovery of pregnancy. Previous notes: Patient is a 29 year old female with the above-stated past psychiatric history who is seen in follow-up.  She stated she did not sleep well last night.  She stated that she feels tremulous and is well "twitchy".  She stated that the medications were not helping with this.  She is currently on lorazepam, Latuda, and sertraline.  Her vital signs are stable, she is afebrile.  Her most recent CIWA this a.m. was 1.  Nursing notes reflect that she slept 4.75 hours last night.  We also discussed her application to get into [facility].  Review of her admission laboratories revealed a mildly low bicarb at 21, a mildly elevated white blood cell count at 14.1, elevated platelets at 451,000.  Her urine pregnancy test was greater than 2000.  RPR was negative.  Blood alcohol on admission  was less than 10.  Drug screen was positive for cocaine.  We do not have an EKG on her.  Principal Problem: Substance induced mood disorder (HCC) Diagnosis: Principal Problem:   Substance induced mood disorder (HCC) Active Problems:   Schizoaffective disorder, bipolar type (HCC)  Total Time spent with patient: 15 minutes  Past Psychiatric History: See admission H&P  Past Medical History:  Past Medical History:  Diagnosis Date  . Crack cocaine use    History reviewed. No pertinent surgical history.  Family History: History reviewed. No pertinent family history.  Family Psychiatric  History: See admission H&P  Social History:  Social History   Substance and Sexual Activity  Alcohol Use Yes  . Alcohol/week: 6.0 - 12.0 standard drinks  . Types: 6 - 12 Cans of beer per week     Social History   Substance and Sexual Activity  Drug Use Yes  . Frequency: 7.0 times per week  . Types: "Crack" cocaine   Comment: Daily use -- up to a gram    Social History   Socioeconomic History  . Marital status: Single    Spouse name: Not on file  . Number of children: Not on file  . Years of education: Not on file  . Highest education level: Not on file  Occupational History  . Occupation: Unemployed  Tobacco Use  . Smoking status: Current Every Day Smoker    Types: Cigarettes  . Smokeless tobacco: Never Used  Substance and Sexual Activity  . Alcohol use: Yes    Alcohol/week: 6.0 - 12.0 standard  drinks    Types: 6 - 12 Cans of beer per week  . Drug use: Yes    Frequency: 7.0 times per week    Types: "Crack" cocaine    Comment: Daily use -- up to a gram  . Sexual activity: Yes  Other Topics Concern  . Not on file  Social History Narrative   Client lives in [place]; unemployed; not actively seeking under care of psychiatrist, recently stopped attending [facility].   Social Determinants of Health   Financial Resource Strain:   . Difficulty of Paying Living Expenses: Not on  file  Food Insecurity:   . Worried About Charity fundraiser in the Last Year: Not on file  . Ran Out of Food in the Last Year: Not on file  Transportation Needs:   . Lack of Transportation (Medical): Not on file  . Lack of Transportation (Non-Medical): Not on file  Physical Activity:   . Days of Exercise per Week: Not on file  . Minutes of Exercise per Session: Not on file  Stress:   . Feeling of Stress : Not on file  Social Connections:   . Frequency of Communication with Friends and Family: Not on file  . Frequency of Social Gatherings with Friends and Family: Not on file  . Attends Religious Services: Not on file  . Active Member of Clubs or Organizations: Not on file  . Attends Archivist Meetings: Not on file  . Marital Status: Not on file   Additional Social History:   Sleep: Fair, 5.25 hours per documentation.  Appetite:  Fair  Current Medications: Current Facility-Administered Medications  Medication Dose Route Frequency Provider Last Rate Last Admin  . folic acid (FOLVITE) tablet 1 mg  1 mg Oral Daily Cobos, Myer Peer, MD   1 mg at 11/13/19 0844  . hydrOXYzine (ATARAX/VISTARIL) tablet 25 mg  25 mg Oral Q6H PRN Sharma Covert, MD      . hydrOXYzine (ATARAX/VISTARIL) tablet 50 mg  50 mg Oral QHS PRN Sharma Covert, MD      . LORazepam (ATIVAN) tablet 1 mg  1 mg Oral Q6H PRN Cobos, Myer Peer, MD   1 mg at 11/12/19 2122  . lurasidone (LATUDA) tablet 40 mg  40 mg Oral Q breakfast Sharma Covert, MD   40 mg at 11/13/19 0843  . prenatal multivitamin tablet 1 tablet  1 tablet Oral Daily Cobos, Myer Peer, MD   1 tablet at 11/13/19 0844  . sertraline (ZOLOFT) tablet 50 mg  50 mg Oral Daily Cobos, Myer Peer, MD   50 mg at 11/13/19 0844  . thiamine tablet 100 mg  100 mg Oral Daily Cobos, Myer Peer, MD   100 mg at 11/13/19 0844   Lab Results:  Results for orders placed or performed during the hospital encounter of 11/09/19 (from the past 48 hour(s))   CBC with Differential/Platelet     Status: Abnormal   Collection Time: 11/13/19  6:16 AM  Result Value Ref Range   WBC 11.8 (H) 4.0 - 10.5 K/uL   RBC 4.83 3.87 - 5.11 MIL/uL   Hemoglobin 13.7 12.0 - 15.0 g/dL   HCT 43.3 36.0 - 46.0 %   MCV 89.6 80.0 - 100.0 fL   MCH 28.4 26.0 - 34.0 pg   MCHC 31.6 30.0 - 36.0 g/dL   RDW 15.0 11.5 - 15.5 %   Platelets 398 150 - 400 K/uL   nRBC 0.0 0.0 - 0.2 %  Neutrophils Relative % 60 %   Neutro Abs 7.1 1.7 - 7.7 K/uL   Lymphocytes Relative 32 %   Lymphs Abs 3.7 0.7 - 4.0 K/uL   Monocytes Relative 6 %   Monocytes Absolute 0.7 0.1 - 1.0 K/uL   Eosinophils Relative 2 %   Eosinophils Absolute 0.2 0.0 - 0.5 K/uL   Basophils Relative 0 %   Basophils Absolute 0.1 0.0 - 0.1 K/uL   Immature Granulocytes 0 %   Abs Immature Granulocytes 0.04 0.00 - 0.07 K/uL    Comment: Performed at [facility], [address], [place], [place] [address]   Blood Alcohol level:  Lab Results  Component Value Date   [facility] <10 11/09/2019   ETH <10 09/09/2019   Metabolic Disorder Labs: Lab Results  Component Value Date   HGBA1C 6.3 (H) 04/13/2019   MPG 134.11 04/13/2019   No results found for: PROLACTIN Lab Results  Component Value Date   CHOL 191 04/13/2019   TRIG 125 04/13/2019   HDL 64 04/13/2019   CHOLHDL 3.0 04/13/2019   VLDL 25 04/13/2019   LDLCALC 102 (H) 04/13/2019   Physical Findings: AIMS: Facial and Oral Movements Muscles of Facial Expression: None, normal Lips and Perioral Area: None, normal Jaw: None, normal Tongue: None, normal,Extremity Movements Upper (arms, wrists, hands, fingers): None, normal Lower (legs, knees, ankles, toes): None, normal, Trunk Movements Neck, shoulders, hips: None, normal, Overall Severity Severity of abnormal movements (highest score from questions above): None, normal Incapacitation due to abnormal movements: None, normal Patient's awareness of abnormal movements (rate only patient's report):  No Awareness, Dental Status Current problems with teeth and/or dentures?: No Does patient usually wear dentures?: No  CIWA:  CIWA-Ar Total: 1 COWS:  COWS Total Score: 3  Musculoskeletal: Strength & Muscle Tone: within normal limits Gait & Station: normal Patient leans: N/A  Psychiatric Specialty Exam: Physical Exam  Nursing note and vitals reviewed. Constitutional: She is oriented to person, place, and time. She appears well-developed and well-nourished.  HENT:  Head: Normocephalic and atraumatic.  Cardiovascular: Normal rate.  Respiratory: Effort normal.  Genitourinary:    Genitourinary Comments: Deferred   Musculoskeletal:        General: Normal range of motion.     Cervical back: Normal range of motion.  Neurological: She is alert and oriented to person, place, and time.  Skin: Skin is warm.    Review of Systems  Constitutional: Negative for chills, diaphoresis and fever.  HENT: Negative for congestion, rhinorrhea, sneezing and sore throat.   Respiratory: Negative for cough, shortness of breath and wheezing.   Cardiovascular: Negative for chest pain and palpitations.  Gastrointestinal: Negative for diarrhea, nausea and vomiting.  Genitourinary: Negative for difficulty urinating.       Patient is [redacted] weeks pregnant per ultra sound results of 11-12-19.  Allergic/Immunologic: Negative for environmental allergies and food allergies.  Neurological: Negative for dizziness, tremors, seizures and headaches.  Psychiatric/Behavioral: Positive for agitation, behavioral problems and dysphoric mood. Negative for confusion, decreased concentration, hallucinations, self-injury, sleep disturbance and suicidal ideas. The patient is nervous/anxious. The patient is not hyperactive.     Blood pressure 127/73, pulse 93, temperature 98.6 F (37 C), temperature source Oral, resp. rate 16, height 4\' 11"  (1.499 m), weight 88 kg, SpO2 98 %.Body mass index is 39.18 kg/m.  General Appearance:  Disheveled  Eye Contact:  Fair  Speech:  Normal Rate  Volume:  Normal  Mood:  Anxious and Dysphoric  Affect:  Congruent and  Depressed  Thought Process:  Coherent and Descriptions of Associations: Intact  Orientation:  Full (Time, Place, and Person)  Thought Content:  Logical  Suicidal Thoughts:  Yes.  without intent/plan  Homicidal Thoughts:  No  Memory:  Immediate;   Fair Recent;   Fair Remote;   Fair  Judgement:  Intact  Insight:  Lacking  Psychomotor Activity:  Increased  Concentration:  Concentration: Poor and Attention Span: Poor  Recall:  [employer] of Knowledge:  Fair  Language:  Good  Akathisia:  Negative  Handed:  Right  AIMS (if indicated):     Assets:  Desire for Improvement Resilience  ADL's:  Intact  Cognition:  WNL  Sleep:  Number of Hours: 5.25   Treatment Plan Summary: Daily contact with patient to assess and evaluate symptoms and progress in treatment, Medication management and Plan : Patient is seen and examined.  Patient is a 29 year old female with the above-stated past psychiatric history who is seen in follow-up.   Diagnosis: #1 substance-induced mood disorder,  #2 alcohol withdrawal,  #3 alcohol dependence,  #4 cocaine dependence,  #5 pregnancy.  Patient is seen in follow-up.  Patient complains of racing thoughts, tremors, jitteriness. Her vital signs are stable.  She does have lorazepam available 1 mg p.o. every 6 hours as needed a CIWA greater than 10.  I will write for hydroxyzine 25 mg p.o. every 6 hours as needed anxiety.  And as well she can have 50 mg p.o. nightly as needed insomnia.  She will continue the Zoloft at 50 mg p.o. daily.  I will increase her Latuda to 40 mg p.o. breakfast.  She also remains on a prenatal vitamin as well as thiamine and folic acid.  She did have an RPR done in the emergency department, her white count was elevated on admission, and I will recheck that.  That also goes for her platelet count.  Her CBC from 2 months ago  showed a white blood cell count of 11.1 and platelets of 472,000.  6 months ago she was at 419,000, but this normalized.  We are waiting for a bed for her for substance abuse treatment, and hopefully we can get her into treatment.  1.  Continue folic acid 1 mg p.o. daily for nutritional supplementation. 2.  Continue hydroxyzine 25 mg p.o. every 6 hours as needed anxiety. 3.  Continue hydroxyzine 50 mg p.o. nightly as needed insomnia. 4.  Continue lorazepam 1 mg p.o. every 6 hours as needed a CIWA greater than 10. 5.  Continue Latuda to 40 mg p.o. daily with breakfast for racing thoughts. 6.  Continue a prenatal vitamin 1 tablet p.o. daily for nutritional supplementation. 7.  Continue Zoloft 50 mg p.o. daily for anxiety and depression. 8.  Continue thiamine 100 mg p.o. daily for nutritional supplementation. 9.  Disposition planning-in progress.  Armandina Stammer, NP, [facility], FNP-BC 11/13/2019, 10:13 AMPatient ID: Jordan Walton, female   DOB: 07-24-1991, 29 y.o.   MRN: [id number]

## 2019-11-13 NOTE — Progress Notes (Signed)
D: Pt denied SI/HI. Pt stated that she felt weak and she wanted to stay in her room and sleep.  A:  Medications administered per MD orders.  Assessed for needs and concerns.  Monitored for safety.  Encouraged pt to reach out if pt needs assistance.  R: Pt is irritable and has trouble focusing.  Pt exhibits heightened emotions at times but she is able to be redirected.  Pt remains safe on the unit.  RN will continue to monitor and provide support as needed.

## 2019-11-13 NOTE — Progress Notes (Signed)
Recreation Therapy Notes  Date:  2.10.21 Time: 0930 Location: [address] Group Room  Group Topic: Stress Management  Goal Area(s) Addresses:  Patient will identify positive stress management techniques. Patient will identify benefits of using stress management post d/c.  Intervention: Stress Management  Activity :  Guided Imagery.  LRT read a script that lead patients to the beach to hear the peaceful waves.  Patients were to listen and follow along as script was read to engage in activity.  Education:  Stress Management, Discharge Planning.   Education Outcome: Acknowledges Education  Clinical Observations/Feedback: Pt did not attend group activity.     Caroll Rancher, LRT/CTRS         Caroll Rancher A 11/13/2019 11:25 AM

## 2019-11-13 NOTE — Progress Notes (Signed)
   11/12/19 2300  Psych Admission Type (Psych Patients Only)  Admission Status Voluntary  Psychosocial Assessment  Patient Complaints Anxiety  Eye Contact Brief  Facial Expression Flat  Affect Appropriate to circumstance  Speech Logical/coherent  Interaction Assertive  Motor Activity Other (Comment) (WDL)  Appearance/Hygiene In scrubs  Behavior Characteristics Anxious  Mood Anxious  Thought Process  Coherency WDL  Content WDL  Delusions None reported or observed  Perception WDL  Hallucination Auditory  Judgment Poor  Confusion None  Danger to Self  Current suicidal ideation? Passive  Self-Injurious Behavior No self-injurious ideation or behavior indicators observed or expressed   Agreement Not to Harm Self Yes  Description of Agreement Verbal  Danger to Others  Danger to Others None reported or observed

## 2019-11-14 DIAGNOSIS — F339 Major depressive disorder, recurrent, unspecified: Secondary | ICD-10-CM | POA: Diagnosis present

## 2019-11-14 MED ORDER — ALUM & MAG HYDROXIDE-SIMETH 200-200-20 MG/5ML PO SUSP: 30.0000 mL | ORAL | Status: DC | PRN

## 2019-11-14 MED ORDER — MAGNESIUM HYDROXIDE 400 MG/5ML PO SUSP
30.0000 mL | Freq: Every day | ORAL | Status: DC | PRN
  Filled 2019-11-14: qty 30

## 2019-11-14 MED ORDER — ACETAMINOPHEN 325 MG PO TABS
650.0000 mg | ORAL_TABLET | Freq: Four times a day (QID) | ORAL | Status: DC | PRN
  Administered 2019-11-16: 17:00:00 650 mg via ORAL
  Filled 2019-11-14: qty 2

## 2019-11-14 MED ORDER — LURASIDONE HCL 60 MG PO TABS
60.0000 mg | ORAL_TABLET | Freq: Every day | ORAL | Status: DC
  Administered 2019-11-15 – 2019-11-19 (×5): 60 mg via ORAL
  Filled 2019-11-14 (×7): qty 1

## 2019-11-14 MED ORDER — SERTRALINE HCL 50 MG PO TABS
75.0000 mg | ORAL_TABLET | Freq: Every day | ORAL | Status: DC
  Administered 2019-11-15 – 2019-11-19 (×5): 75 mg via ORAL
  Filled 2019-11-14 (×8): qty 1

## 2019-11-14 NOTE — Plan of Care (Signed)
Nurse discussed anxiety, depression and coping skills with patient.  

## 2019-11-14 NOTE — Progress Notes (Signed)
   11/14/19 2000  Psych Admission Type (Psych Patients Only)  Admission Status Voluntary  Psychosocial Assessment  Patient Complaints None  Eye Contact Brief  Facial Expression Anxious  Affect Appropriate to circumstance  Speech Logical/coherent  Interaction Assertive  Motor Activity Other (Comment) (WDL)  Appearance/Hygiene Unremarkable  Behavior Characteristics Cooperative  Mood Anxious  Thought Process  Coherency WDL  Content WDL  Delusions None reported or observed  Perception WDL  Hallucination None reported or observed  Judgment Poor  Confusion None  Danger to Self  Current suicidal ideation? Denies  Self-Injurious Behavior No self-injurious ideation or behavior indicators observed or expressed   Agreement Not to Harm Self Yes  Description of Agreement Verbal  Danger to Others  Danger to Others None reported or observed

## 2019-11-14 NOTE — Progress Notes (Signed)
   11/13/19 2348  Psych Admission Type (Psych Patients Only)  Admission Status Voluntary  Psychosocial Assessment  Patient Complaints Anxiety  Eye Contact Brief  Facial Expression Angry;Animated  Affect Appropriate to circumstance  Speech Logical/coherent  Interaction Assertive  Motor Activity Other (Comment) (WDL)  Appearance/Hygiene In scrubs;Unremarkable  Behavior Characteristics Cooperative  Mood Depressed;Anxious  Thought Process  Coherency WDL  Content WDL  Delusions None reported or observed  Perception WDL  Hallucination Auditory;Visual  Judgment Poor  Confusion None  Danger to Self  Current suicidal ideation? Denies  Self-Injurious Behavior No self-injurious ideation or behavior indicators observed or expressed   Agreement Not to Harm Self Yes  Description of Agreement Verbal  Danger to Others  Danger to Others None reported or observed  D: Patient in dayroom reports she had a good day. Pt states she is overwhelmed with her pregnancy. A: Medications administered as prescribed. Support and encouragement provided as needed.  R: Patient remains safe on the unit. Will continue to monitor for safety and stability.

## 2019-11-14 NOTE — Progress Notes (Signed)
D:  Patient denied SI and HI, contracts for safety.  Denied A/V hallucinations.  Denied pain. A:  Medications administered per MD orders.  Emotional support and encouragement given patient. R:  Safety maintained with 15 minute checks.  

## 2019-11-14 NOTE — Progress Notes (Signed)
[facility] MD Progress Note  11/14/2019 11:28 AM Jordan Walton  MRN:  [id number] Subjective:  Patient is a 29 year old female with a past psychiatric history significant for substance-induced mood disorder, alcohol dependence, cocaine dependence who was admitted on 11/11/2019 secondary to depression, anxiety, homelessness and recent discovery of pregnancy.  Objective: Patient is seen and examined.  Patient is a 29 year old female with the above-stated past psychiatric history who is seen in follow-up.  She appears to be slowly improving.  She was significantly irritable yesterday, but today is much more pleasant and seems to be progressing.  She still complains of anxiety symptoms.  She also complains of racing thoughts.  We discussed trying to keep her medication burden as low as possible given the pregnancy, but I told her we would adjust the dosages of some of her medications.  She denied suicidal ideations this morning.  Her vital signs are stable, she is afebrile.  She slept 6 hours last night.  Her CBC from 2/10 showed that her white blood cell count had dropped to 11.8.  Otherwi her chlamydia and gonorrhea came back negative.  Her RPR was negative.  Her HIV was negative.  CBC it was normal.  Differential was normal as well.  Her ultrasound which was performed on 2/9 estimated the age of the pregnancy at approximately 8-1/2 weeks.  We are still awaiting a decision for residential substance abuse treatment for her.  Principal Problem: Substance induced mood disorder (HCC) Diagnosis: Principal Problem:   Substance induced mood disorder (HCC) Active Problems:   Schizoaffective disorder, bipolar type ([place])   Major depressive disorder, recurrent episode ([place])  Total Time spent with patient: 20 minutes  Past Psychiatric History: See admission H&P  Past Medical History:  Past Medical History:  Diagnosis Date  . Crack cocaine use    History reviewed. No pertinent surgical history. Family History:  History reviewed. No pertinent family history. Family Psychiatric  History: See admission H&P Social History:  Social History   Substance and Sexual Activity  Alcohol Use Yes  . Alcohol/week: 6.0 - 12.0 standard drinks  . Types: 6 - 12 Cans of beer per week     Social History   Substance and Sexual Activity  Drug Use Yes  . Frequency: 7.0 times per week  . Types: "Crack" cocaine   Comment: Daily use -- up to a gram    Social History   Socioeconomic History  . Marital status: Single    Spouse name: Not on file  . Number of children: Not on file  . Years of education: Not on file  . Highest education level: Not on file  Occupational History  . Occupation: Unemployed  Tobacco Use  . Smoking status: Current Every Day Smoker    Types: Cigarettes  . Smokeless tobacco: Never Used  Substance and Sexual Activity  . Alcohol use: Yes    Alcohol/week: 6.0 - 12.0 standard drinks    Types: 6 - 12 Cans of beer per week  . Drug use: Yes    Frequency: 7.0 times per week    Types: "Crack" cocaine    Comment: Daily use -- up to a gram  . Sexual activity: Yes  Other Topics Concern  . Not on file  Social History Narrative   Client lives in [place]; unemployed; not actively seeking under care of psychiatrist, recently stopped attending [employer].   Social Determinants of Health   Financial Resource Strain:   . Difficulty of Paying Living Expenses: Not on  file  Food Insecurity:   . Worried About Programme researcher, broadcasting/film/video in the Last Year: Not on file  . Ran Out of Food in the Last Year: Not on file  Transportation Needs:   . Lack of Transportation (Medical): Not on file  . Lack of Transportation (Non-Medical): Not on file  Physical Activity:   . Days of Exercise per Week: Not on file  . Minutes of Exercise per Session: Not on file  Stress:   . Feeling of Stress : Not on file  Social Connections:   . Frequency of Communication with Friends and Family: Not on file  . Frequency of  Social Gatherings with Friends and Family: Not on file  . Attends Religious Services: Not on file  . Active Member of Clubs or Organizations: Not on file  . Attends Banker Meetings: Not on file  . Marital Status: Not on file   Additional Social History:                         Sleep: Fair  Appetite:  Good  Current Medications: Current Facility-Administered Medications  Medication Dose Route Frequency Provider Last Rate Last Admin  . acetaminophen (TYLENOL) tablet 650 mg  650 mg Oral Q6H PRN Nwoko, Agnes I, NP      . alum & mag hydroxide-simeth (MAALOX/MYLANTA) 200-200-20 MG/5ML suspension 30 mL  30 mL Oral Q4H PRN Nwoko, Agnes I, NP      . folic acid (FOLVITE) tablet 1 mg  1 mg Oral Daily Cobos, Rockey Situ, MD   1 mg at 11/14/19 0738  . hydrOXYzine (ATARAX/VISTARIL) tablet 25 mg  25 mg Oral Q6H PRN Antonieta Pert, MD   25 mg at 11/13/19 1752  . hydrOXYzine (ATARAX/VISTARIL) tablet 50 mg  50 mg Oral QHS PRN Antonieta Pert, MD   50 mg at 11/13/19 2129  . lurasidone (LATUDA) tablet 40 mg  40 mg Oral Q breakfast Antonieta Pert, MD   40 mg at 11/14/19 0738  . magnesium hydroxide (MILK OF MAGNESIA) suspension 30 mL  30 mL Oral Daily PRN Armandina Stammer I, NP      . prenatal multivitamin tablet 1 tablet  1 tablet Oral Daily Cobos, Rockey Situ, MD   1 tablet at 11/14/19 0738  . sertraline (ZOLOFT) tablet 50 mg  50 mg Oral Daily Cobos, Rockey Situ, MD   50 mg at 11/14/19 0739  . thiamine tablet 100 mg  100 mg Oral Daily Cobos, Rockey Situ, MD   100 mg at 11/14/19 1191    Lab Results:  Results for orders placed or performed during the hospital encounter of 11/09/19 (from the past 48 hour(s))  CBC with Differential/Platelet     Status: Abnormal   Collection Time: 11/13/19  6:16 AM  Result Value Ref Range   WBC 11.8 (H) 4.0 - 10.5 K/uL   RBC 4.83 3.87 - 5.11 MIL/uL   Hemoglobin 13.7 12.0 - 15.0 g/dL   HCT 47.8 29.5 - 62.1 %   MCV 89.6 80.0 - 100.0 fL   MCH 28.4  26.0 - 34.0 pg   MCHC 31.6 30.0 - 36.0 g/dL   RDW 30.8 65.7 - 84.6 %   Platelets 398 150 - 400 K/uL   nRBC 0.0 0.0 - 0.2 %   Neutrophils Relative % 60 %   Neutro Abs 7.1 1.7 - 7.7 K/uL   Lymphocytes Relative 32 %   Lymphs Abs 3.7 0.7 -  4.0 K/uL   Monocytes Relative 6 %   Monocytes Absolute 0.7 0.1 - 1.0 K/uL   Eosinophils Relative 2 %   Eosinophils Absolute 0.2 0.0 - 0.5 K/uL   Basophils Relative 0 %   Basophils Absolute 0.1 0.0 - 0.1 K/uL   Immature Granulocytes 0 %   Abs Immature Granulocytes 0.04 0.00 - 0.07 K/uL    Comment: Performed at [facility], [address] [place], [place] [address]    Blood Alcohol level:  Lab Results  Component Value Date   [facility] <10 11/09/2019   ETH <10 09/09/2019    Metabolic Disorder Labs: Lab Results  Component Value Date   HGBA1C 6.3 (H) 04/13/2019   MPG 134.11 04/13/2019   No results found for: PROLACTIN Lab Results  Component Value Date   CHOL 191 04/13/2019   TRIG 125 04/13/2019   HDL 64 04/13/2019   CHOLHDL 3.0 04/13/2019   VLDL 25 04/13/2019   LDLCALC 102 (H) 04/13/2019    Physical Findings: AIMS: Facial and Oral Movements Muscles of Facial Expression: None, normal Lips and Perioral Area: None, normal Jaw: None, normal Tongue: None, normal,Extremity Movements Upper (arms, wrists, hands, fingers): None, normal Lower (legs, knees, ankles, toes): None, normal, Trunk Movements Neck, shoulders, hips: None, normal, Overall Severity Severity of abnormal movements (highest score from questions above): None, normal Incapacitation due to abnormal movements: None, normal Patient's awareness of abnormal movements (rate only patient's report): No Awareness, Dental Status Current problems with teeth and/or dentures?: No Does patient usually wear dentures?: No  CIWA:  CIWA-Ar Total: 1 COWS:  COWS Total Score: 3  Musculoskeletal: Strength & Muscle Tone: within normal limits Gait & Station: normal Patient  leans: N/A  Psychiatric Specialty Exam: Physical Exam  Nursing note and vitals reviewed. Constitutional: She is oriented to person, place, and time. She appears well-developed and well-nourished.  HENT:  Head: Normocephalic and atraumatic.  Respiratory: Effort normal.  Neurological: She is alert and oriented to person, place, and time.    Review of Systems  Blood pressure 94/78, pulse 86, temperature 98.2 F (36.8 C), temperature source Oral, resp. rate 16, height 4\' 11"  (1.499 m), weight 88 kg, SpO2 98 %.Body mass index is 39.18 kg/m.  General Appearance: Casual  Eye Contact:  Good  Speech:  Normal Rate  Volume:  Normal  Mood:  Anxious  Affect:  Congruent  Thought Process:  Coherent and Descriptions of Associations: Intact  Orientation:  Full (Time, Place, and Person)  Thought Content:  Logical  Suicidal Thoughts:  No  Homicidal Thoughts:  No  Memory:  Immediate;   Fair Recent;   Fair Remote;   Fair  Judgement:  Intact  Insight:  Fair  Psychomotor Activity:  Normal  Concentration:  Concentration: Fair and Attention Span: Fair  Recall:  of Knowledge:  Good  Language:  Good  Akathisia:  Negative  Handed:  Right  AIMS (if indicated):     Assets:  Communication Skills Desire for Improvement Resilience  ADL's:  Intact  Cognition:  WNL  Sleep:  Number of Hours: 6     Treatment Plan Summary: Daily contact with patient to assess and evaluate symptoms and progress in treatment, Medication management and Plan : Patient is seen and examined.  Patient is a 30 year old female with the above-stated past psychiatric history who is seen in follow-up.  Diagnosis: #1 substance-induced mood disorder, #2 alcohol withdrawal, #3 alcohol dependence, #4 cocaine dependence, #5 pregnancy  Patient is seen  in follow-up.  She is slowly improving.  We are still waiting for response on residential substance abuse treatment.  She continues to complain of racing thoughts, so I will  increase her Latuda to 60 mg p.o. daily.  I will cautiously increase her Zoloft to 75 mg p.o. daily.  No other changes in her medicines except changing the lorazepam to every 12 hours as needed a CIWA greater than 10.  1.  Continue folic acid 1 mg p.o. daily for nutritional supplementation. 2.  Continue hydroxyzine 25 mg p.o. every 6 hours as needed anxiety. 3.  Continue hydroxyzine 50 mg p.o. nightly as needed insomnia. 4.    Decrease to lorazepam 1 mg p.o. every 12 hours as needed a CIWA greater than 10. 5.  Increase Latuda to 60 mg p.o. daily with breakfast for racing thoughts. 6.  Continue a prenatal vitamin 1 tablet p.o. daily for nutritional supplementation. 7.  Increase Zoloft to 75 mg p.o. daily for anxiety and depression. 8.  Continue thiamine 100 mg p.o. daily for nutritional supplementation. 9.  Disposition planning-in progress. Antonieta Pert, MD 11/14/2019, 11:28 AM

## 2019-11-15 DIAGNOSIS — F332 Major depressive disorder, recurrent severe without psychotic features: Secondary | ICD-10-CM

## 2019-11-15 DIAGNOSIS — F250 Schizoaffective disorder, bipolar type: Secondary | ICD-10-CM

## 2019-11-15 NOTE — Progress Notes (Signed)
Patient attended AA meeting and participated.  

## 2019-11-15 NOTE — Plan of Care (Signed)
Progress note  D: pt found in bed; compliant with medication administration. Pt states they slept well. Pt rates their depression/hopelessness/anxiety a 0/2/2 out of 10 respectively. Pt has complaints of blood in their stool and morning sickness. Pt denied these with their previous pregnancy. Pt states their goal for today is to be active. Pt has been viewed in bed for most of the morning. Pt denies si/hi/ah/vh and verbally agrees to approach staff if these become apparent or before harming themself/others while at [facility].  A: Pt provided support and encouragement. Pt given medication per protocol and standing orders. Q66m safety checks implemented and continued.  R: Pt safe on the unit. Will continue to monitor.  Pt progressing in the following metrics  Problem: Education: Goal: Ability to state activities that reduce stress will improve Outcome: Progressing   Problem: Coping: Goal: Ability to identify and develop effective coping behavior will improve Outcome: Progressing   Problem: Self-Concept: Goal: Ability to identify factors that promote anxiety will improve Outcome: Progressing

## 2019-11-15 NOTE — Progress Notes (Signed)
   11/15/19 2000  Psych Admission Type (Psych Patients Only)  Admission Status Voluntary  Psychosocial Assessment  Patient Complaints Anxiety  Eye Contact Fair  Facial Expression Animated;Anxious  Affect Anxious;Appropriate to circumstance  Speech Logical/coherent  Interaction Assertive;Childlike  Motor Activity Fidgety  Appearance/Hygiene In scrubs  Behavior Characteristics Cooperative  Mood Depressed  Thought Process  Coherency WDL  Content Hypochondria  Delusions None reported or observed  Perception WDL  Hallucination None reported or observed  Judgment Poor  Confusion None  Danger to Self  Current suicidal ideation? Denies  Danger to Others  Danger to Others None reported or observed

## 2019-11-15 NOTE — Progress Notes (Signed)
Recreation Therapy Notes  Date:  2.12.21 Time: 0930 Location: 300 Hall Group Room  Group Topic: Stress Management  Goal Area(s) Addresses:  Patient will identify positive stress management techniques. Patient will identify benefits of using stress management post d/c.  Intervention: Stress Management  Activity : Meditation.  LRT played a meditation that focused on choosing to make positive choices.  Patients were to listen and follow along as meditation played.  Education:  Stress Management, Discharge Planning.   Education Outcome: Acknowledges Education  Clinical Observations/Feedback: Pt did not attend activity.    Caroll Rancher, LRT/CTRS         Lillia Abed, Nathaniel Yaden A 11/15/2019 11:06 AM

## 2019-11-15 NOTE — Tx Team (Signed)
Interdisciplinary Treatment and Diagnostic Plan Update  11/15/2019 Time of Session:  Jordan Walton MRN: [id number]  Principal Diagnosis: Substance induced mood disorder ([place])  Secondary Diagnoses: Principal Problem:   Substance induced mood disorder ([place]) Active Problems:   Schizoaffective disorder, bipolar type ([place])   Major depressive disorder, recurrent episode ([place])   Current Medications:  Current Facility-Administered Medications  Medication Dose Route Frequency Provider Last Rate Last Admin  . acetaminophen (TYLENOL) tablet 650 mg  650 mg Oral Q6H PRN Nwoko, Agnes I, NP      . alum & mag hydroxide-simeth (MAALOX/MYLANTA) 200-200-20 MG/5ML suspension 30 mL  30 mL Oral Q4H PRN Nwoko, Agnes I, NP      . folic acid (FOLVITE) tablet 1 mg  1 mg Oral Daily Cobos, Myer Peer, MD   1 mg at 11/15/19 0805  . hydrOXYzine (ATARAX/VISTARIL) tablet 25 mg  25 mg Oral Q6H PRN Sharma Covert, MD   25 mg at 11/14/19 1827  . hydrOXYzine (ATARAX/VISTARIL) tablet 50 mg  50 mg Oral QHS PRN Sharma Covert, MD   50 mg at 11/14/19 2118  . Lurasidone HCl TABS 60 mg  60 mg Oral Q breakfast Sharma Covert, MD   60 mg at 11/15/19 0804  . magnesium hydroxide (MILK OF MAGNESIA) suspension 30 mL  30 mL Oral Daily PRN Lindell Spar I, NP      . prenatal multivitamin tablet 1 tablet  1 tablet Oral Daily Cobos, Myer Peer, MD   1 tablet at 11/15/19 0803  . sertraline (ZOLOFT) tablet 75 mg  75 mg Oral Daily Sharma Covert, MD   75 mg at 11/15/19 0803  . thiamine tablet 100 mg  100 mg Oral Daily Cobos, Myer Peer, MD   100 mg at 11/15/19 0803   PTA Medications: No medications prior to admission.    Patient Stressors:    Patient Strengths:    Treatment Modalities: Medication Management, Group therapy, Case management,  1 to 1 session with clinician, Psychoeducation, Recreational therapy.   Physician Treatment Plan for Primary Diagnosis: Substance induced mood disorder ([place]) Long Term  Goal(s): Improvement in symptoms so as ready for discharge Improvement in symptoms so as ready for discharge   Short Term Goals: Ability to identify changes in lifestyle to reduce recurrence of condition will improve Ability to identify triggers associated with substance abuse/mental health issues will improve Ability to identify changes in lifestyle to reduce recurrence of condition will improve Ability to verbalize feelings will improve Ability to disclose and discuss suicidal ideas Ability to demonstrate self-control will improve Ability to identify and develop effective coping behaviors will improve Ability to maintain clinical measurements within normal limits will improve  Medication Management: Evaluate patient's response, side effects, and tolerance of medication regimen.  Therapeutic Interventions: 1 to 1 sessions, Unit Group sessions and Medication administration.  Evaluation of Outcomes: Progressing  Physician Treatment Plan for Secondary Diagnosis: Principal Problem:   Substance induced mood disorder ([place]) Active Problems:   Schizoaffective disorder, bipolar type ([place])   Major depressive disorder, recurrent episode ([place])  Long Term Goal(s): Improvement in symptoms so as ready for discharge Improvement in symptoms so as ready for discharge   Short Term Goals: Ability to identify changes in lifestyle to reduce recurrence of condition will improve Ability to identify triggers associated with substance abuse/mental health issues will improve Ability to identify changes in lifestyle to reduce recurrence of condition will improve Ability to verbalize feelings will improve Ability to disclose and discuss  suicidal ideas Ability to demonstrate self-control will improve Ability to identify and develop effective coping behaviors will improve Ability to maintain clinical measurements within normal limits will improve     Medication Management: Evaluate patient's response, side  effects, and tolerance of medication regimen.  Therapeutic Interventions: 1 to 1 sessions, Unit Group sessions and Medication administration.  Evaluation of Outcomes: Progressing   RN Treatment Plan for Primary Diagnosis: Substance induced mood disorder (HCC) Long Term Goal(s): Knowledge of disease and therapeutic regimen to maintain health will improve  Short Term Goals: Ability to demonstrate self-control, Ability to participate in decision making will improve, Ability to verbalize feelings will improve, Ability to identify and develop effective coping behaviors will improve and Compliance with prescribed medications will improve  Medication Management: RN will administer medications as ordered by provider, will assess and evaluate patient's response and provide education to patient for prescribed medication. RN will report any adverse and/or side effects to prescribing provider.  Therapeutic Interventions: 1 on 1 counseling sessions, Psychoeducation, Medication administration, Evaluate responses to treatment, Monitor vital signs and CBGs as ordered, Perform/monitor CIWA, COWS, AIMS and Fall Risk screenings as ordered, Perform wound care treatments as ordered.  Evaluation of Outcomes: Progressing   LCSW Treatment Plan for Primary Diagnosis: Substance induced mood disorder (HCC) Long Term Goal(s): Safe transition to appropriate next level of care at discharge, Engage patient in therapeutic group addressing interpersonal concerns.  Short Term Goals: Engage patient in aftercare planning with referrals and resources and Increase skills for wellness and recovery  Therapeutic Interventions: Assess for all discharge needs, 1 to 1 time with Social worker, Explore available resources and support systems, Assess for adequacy in community support network, Educate family and significant other(s) on suicide prevention, Complete Psychosocial Assessment, Interpersonal group therapy.  Evaluation of  Outcomes: Progressing  Progress in Treatment: Attending groups: No.  Participating in groups: No. Taking medication as prescribed: Yes. Toleration medication: Yes. Family/Significant other contact made: Yes, individual(s) contacted:  father, Benita Gutter. Patient understands diagnosis: Yes. Discussing patient identified problems/goals with staff: Yes. Medical problems stabilized or resolved: Yes. Denies suicidal/homicidal ideation: Yes. Issues/concerns per patient self-inventory: No. Other:   New problem(s) identified:None   New Short Term/Long Term Goal(s): Detox, medication stabilization, elimination of SI thoughts, development of comprehensive mental wellness plan.    Patient Goals: Safe discharge plan.  Discharge Plan or Barriers: Referred to [facility] for residential substance use treatment. Patient has been accepted and is waiting for a bed and admission date.   Reason for Continuation of Hospitalization: Anxiety Depression Medication stabilization Suicidal ideation  Estimated Length of Stay: 3-5 days   Attendees: Patient:  Patient declined to meet at this time.  11/15/2019 9:22 AM  Physician: Dr. Nehemiah Massed, MD Dr.Clary 11/15/2019 9:22 AM  Nursing:  11/15/2019 9:22 AM  RN Care Manager: 11/15/2019 9:22 AM  Social Worker: Baldo Daub, LCSW [place], [place] 11/15/2019 9:22 AM  Recreational Therapist:  11/15/2019 9:22 AM  Other:  11/15/2019 9:22 AM  Other:  11/15/2019 9:22 AM  Other: 11/15/2019 9:22 AM    Scribe for Treatment Team: Darreld Mclean, LCSWA 11/15/2019 9:22 AM

## 2019-11-15 NOTE — [facility] Counselor (Signed)
CSW spoke with father, Feliciana Narayan [phone]) to provide updates. Father is pleased to hear that patient is still inpatient and is agreeable to entering additional residential substance use treatment.   Enid Cutter, MSW, LCSW-A Clinical Social Worker [facility]  [phone]

## 2019-11-15 NOTE — Progress Notes (Signed)
Patient's [facility] Coordinator: Darnelle Maffucci [phone]).  Enid Cutter, MSW, LCSW-A Clinical Social Worker [facility]  [phone]

## 2019-11-15 NOTE — [facility] Group Notes (Signed)
LCSW Aftercare Discharge Planning Group Note  11/15/2019   Type of Group and Topic: Psychoeducational Group: Discharge Planning  Participation Level: Did Not Attend  Description of Group  Discharge planning group reviews patient's anticipated discharge plans and assists patients to anticipate and address any barriers to wellness/recovery in the community. Suicide prevention education is reviewed with patients in group.  Therapeutic Goals  1. Patients will state their anticipated discharge plan and mental health aftercare  2. Patients will identify potential barriers to wellness in the community setting  3. Patients will engage in problem solving, solution focused discussion of ways to anticipate and address barriers to wellness/recovery  Summary of Patient Progress  Plan for Discharge/Comments:  Transportation Means:  Supports:  Therapeutic Modalities:  Motivational Interviewing  Ahlijah Raia C Shatara Stanek, LCSWA  11/15/2019 2:02 PM 

## 2019-11-15 NOTE — Progress Notes (Signed)
[facility] MD Progress Note  11/15/2019 1:16 PM Jordan Walton  MRN:  [id number]   Subjective: "I am o'kay. I have a headache."  Objective: Jordan Walton is a 29 year old female with a past psychiatric history significant for substance-induced mood disorder, alcohol dependence, cocaine dependence who was admitted on 11/11/2019 secondary to depression, anxiety, homelessness and recent discovery of pregnancy. On evaluation today, the patient is sitting on the side of the bed in her room. She is alert and oriented x 4, less irritable, but cooperative. She reports her depression as 7 out of 10 and anxiety as 8 out of ten with 10 being he most severe. She reports that she continues to have suicidal thoughts without any intent or plans. She is able to verbally contract for safety while in the hospital. She denies any homicidal thoughts. She denies any auditory visual hallucinations. She reports some nausea. She is encouraged to request saltine crackers to assist with reducing nausea. She reports her appetite as "pretty good." Per chart review, she slept 6.75 hours last night.   From previous note:  She appears to be slowly improving.  She was significantly irritable yesterday, but today is much more pleasant and seems to be progressing.  She still complains of anxiety symptoms.  She also complains of racing thoughts.  We discussed trying to keep her medication burden as low as possible given the pregnancy, but I told her we would adjust the dosages of some of her medications.  She denied suicidal ideations this morning.  Her vital signs are stable, she is afebrile.  She slept 6 hours last night.  Her CBC from 2/10 showed that her white blood cell count had dropped to 11.8.  Otherwi her chlamydia and gonorrhea came back negative.  Her RPR was negative.  Her HIV was negative.  CBC it was normal.  Differential was normal as well.  Her ultrasound which was performed on 2/9 estimated the age of the pregnancy at  approximately 8-1/2 weeks.  We are still awaiting a decision for residential substance abuse treatment for her.  Principal Problem: Substance induced mood disorder (HCC) Diagnosis: Principal Problem:   Substance induced mood disorder (HCC) Active Problems:   Schizoaffective disorder, bipolar type (HCC)   Major depressive disorder, recurrent episode (HCC)  Total Time spent with patient: 15 minutes  Past Psychiatric History: See admission H&P  Past Medical History:  Past Medical History:  Diagnosis Date  . Crack cocaine use    History reviewed. No pertinent surgical history. Family History: History reviewed. No pertinent family history.   Family Psychiatric  History: See admission H&P  Social History:  Social History   Substance and Sexual Activity  Alcohol Use Yes  . Alcohol/week: 6.0 - 12.0 standard drinks  . Types: 6 - 12 Cans of beer per week     Social History   Substance and Sexual Activity  Drug Use Yes  . Frequency: 7.0 times per week  . Types: "Crack" cocaine   Comment: Daily use -- up to a gram    Social History   Socioeconomic History  . Marital status: Single    Spouse name: Not on file  . Number of children: Not on file  . Years of education: Not on file  . Highest education level: Not on file  Occupational History  . Occupation: Unemployed  Tobacco Use  . Smoking status: Current Every Day Smoker    Types: Cigarettes  . Smokeless tobacco: Never Used  Substance and Sexual Activity  .  Alcohol use: Yes    Alcohol/week: 6.0 - 12.0 standard drinks    Types: 6 - 12 Cans of beer per week  . Drug use: Yes    Frequency: 7.0 times per week    Types: "Crack" cocaine    Comment: Daily use -- up to a gram  . Sexual activity: Yes  Other Topics Concern  . Not on file  Social History Narrative   Client lives in [place]; unemployed; not actively seeking under care of psychiatrist, recently stopped attending [employer].   Social Determinants of Health    Financial Resource Strain:   . Difficulty of Paying Living Expenses: Not on file  Food Insecurity:   . Worried About Charity fundraiser in the Last Year: Not on file  . Ran Out of Food in the Last Year: Not on file  Transportation Needs:   . Lack of Transportation (Medical): Not on file  . Lack of Transportation (Non-Medical): Not on file  Physical Activity:   . Days of Exercise per Week: Not on file  . Minutes of Exercise per Session: Not on file  Stress:   . Feeling of Stress : Not on file  Social Connections:   . Frequency of Communication with Friends and Family: Not on file  . Frequency of Social Gatherings with Friends and Family: Not on file  . Attends Religious Services: Not on file  . Active Member of Clubs or Organizations: Not on file  . Attends Archivist Meetings: Not on file  . Marital Status: Not on file   Additional Social History:    Sleep: Good  Appetite:  Good  Current Medications: Current Facility-Administered Medications  Medication Dose Route Frequency Provider Last Rate Last Admin  . acetaminophen (TYLENOL) tablet 650 mg  650 mg Oral Q6H PRN Nwoko, Agnes I, NP      . alum & mag hydroxide-simeth (MAALOX/MYLANTA) 200-200-20 MG/5ML suspension 30 mL  30 mL Oral Q4H PRN Nwoko, Agnes I, NP      . folic acid (FOLVITE) tablet 1 mg  1 mg Oral Daily Cobos, Myer Peer, MD   1 mg at 11/15/19 0805  . hydrOXYzine (ATARAX/VISTARIL) tablet 25 mg  25 mg Oral Q6H PRN Sharma Covert, MD   25 mg at 11/14/19 1827  . hydrOXYzine (ATARAX/VISTARIL) tablet 50 mg  50 mg Oral QHS PRN Sharma Covert, MD   50 mg at 11/14/19 2118  . Lurasidone HCl TABS 60 mg  60 mg Oral Q breakfast Sharma Covert, MD   60 mg at 11/15/19 0804  . magnesium hydroxide (MILK OF MAGNESIA) suspension 30 mL  30 mL Oral Daily PRN Lindell Spar I, NP      . prenatal multivitamin tablet 1 tablet  1 tablet Oral Daily Cobos, Myer Peer, MD   1 tablet at 11/15/19 0803  . sertraline (ZOLOFT)  tablet 75 mg  75 mg Oral Daily Sharma Covert, MD   75 mg at 11/15/19 0803  . thiamine tablet 100 mg  100 mg Oral Daily Cobos, Myer Peer, MD   100 mg at 11/15/19 9371    Lab Results:  No results found for this or any previous visit (from the past 4 hour(s)).  Blood Alcohol level:  Lab Results  Component Value Date   [facility] <10 11/09/2019   ETH <10 69/67/8938    Metabolic Disorder Labs: Lab Results  Component Value Date   HGBA1C 6.3 (H) 04/13/2019   MPG 134.11 04/13/2019  No results found for: PROLACTIN Lab Results  Component Value Date   CHOL 191 04/13/2019   TRIG 125 04/13/2019   HDL 64 04/13/2019   CHOLHDL 3.0 04/13/2019   VLDL 25 04/13/2019   LDLCALC 102 (H) 04/13/2019    Physical Findings: AIMS: Facial and Oral Movements Muscles of Facial Expression: None, normal Lips and Perioral Area: None, normal Jaw: None, normal Tongue: None, normal,Extremity Movements Upper (arms, wrists, hands, fingers): None, normal Lower (legs, knees, ankles, toes): None, normal, Trunk Movements Neck, shoulders, hips: None, normal, Overall Severity Severity of abnormal movements (highest score from questions above): None, normal Incapacitation due to abnormal movements: None, normal Patient's awareness of abnormal movements (rate only patient's report): No Awareness, Dental Status Current problems with teeth and/or dentures?: No Does patient usually wear dentures?: No  CIWA:  CIWA-Ar Total: 1 COWS:  COWS Total Score: 2  Musculoskeletal: Strength & Muscle Tone: within normal limits Gait & Station: normal Patient leans: N/A  Psychiatric Specialty Exam: Physical Exam  Nursing note and vitals reviewed. Constitutional: She is oriented to person, place, and time. She appears well-developed and well-nourished.  HENT:  Head: Normocephalic and atraumatic.  Cardiovascular: Normal rate.  Respiratory: Effort normal.  Musculoskeletal:        General: Normal range of motion.   Neurological: She is alert and oriented to person, place, and time.  Skin: Skin is warm.  Psychiatric: Her speech is normal. Her mood appears anxious. She is not withdrawn and not actively hallucinating. Thought content is not paranoid and not delusional. She exhibits a depressed mood. She expresses suicidal ideation. She expresses no homicidal ideation. She expresses no suicidal plans.    Review of Systems  Constitutional: Negative for activity change, appetite change, chills, diaphoresis, fatigue, fever and unexpected weight change.  HENT: Negative for congestion, rhinorrhea, sneezing and sore throat.   Respiratory: Negative for cough and shortness of breath.   Cardiovascular: Negative for chest pain.  Gastrointestinal: Positive for nausea (related to pregnancy). Negative for diarrhea and vomiting.  Musculoskeletal: Negative.   Skin: Negative.   Neurological: Positive for headaches.  Psychiatric/Behavioral: Positive for agitation, decreased concentration, dysphoric mood (stable with medications) and suicidal ideas (Denies intent/plan. Verbally contracts for safety.). Negative for hallucinations, self-injury and sleep disturbance. The patient is nervous/anxious. The patient is not hyperactive.     Blood pressure 112/72, pulse 89, temperature 98 F (36.7 C), temperature source Oral, resp. rate 16, height 4\' 11"  (1.499 m), weight 88 kg, SpO2 98 %.Body mass index is 39.18 kg/m.  General Appearance: Casual  Eye Contact:  Good  Speech:  Normal Rate  Volume:  Normal  Mood:  States "mood is improving."  Affect:  Congruent  Thought Process:  Coherent and Descriptions of Associations: Intact  Orientation:  Full (Time, Place, and Person)  Thought Content:  Logical  Suicidal Thoughts:  No  Homicidal Thoughts:  No  Memory:  Immediate;   Fair Recent;   Fair Remote;   Fair  Judgement:  Intact  Insight:  Fair  Psychomotor Activity:  Normal  Concentration:  Concentration: Fair and Attention  Span: Fair  Recall:  of Knowledge:  Good  Language:  Good  Akathisia:  Negative  Handed:  Right  AIMS (if indicated):     Assets:  Communication Skills Desire for Improvement Resilience  ADL's:  Intact  Cognition:  WNL  Sleep:  Number of Hours: 6.75   Treatment Plan Summary: Daily contact with patient to assess and evaluate symptoms and  progress in treatment, Medication management and Plan : Patient is seen and examined.  Patient is a 29 year old female with the above-stated past psychiatric history who is seen in follow-up.  Diagnosis:  #1 substance-induced mood disorder,  #2 alcohol withdrawal,  #3 alcohol dependence,  #4 cocaine dependence,  #5 pregnancy  1.  Continue folic acid 1 mg p.o. daily for nutritional supplementation. 2.  Continue hydroxyzine 25 mg p.o. every 6 hours as needed anxiety. 3.  Continue hydroxyzine 50 mg p.o. nightly as needed insomnia. 4.  Discontinue lorazepam 5.  Continue Latuda to 60 mg p.o. daily with breakfast for racing thoughts. 6.  Continue a prenatal vitamin 1 tablet p.o. daily for nutritional supplementation. 7.  Continue Zoloft 75 mg p.o. daily for anxiety and depression. 8.  Continue thiamine 100 mg p.o. daily for nutritional supplementation. 9.  Disposition planning-in progress.  Jackelyn Poling, NP 11/15/2019, 1:16 PM

## 2019-11-16 NOTE — [facility] Counselor (Signed)
Adult Psychoeducational Group Note  Date:  11/16/2019 Time:  3:15 PM  Group Topic/Focus:  Identifying Needs:   The focus of this group is to help patients identify their personal needs that have been historically problematic and identify healthy behaviors to address their needs.  Participation Level:  Did Not Attend   Dione Housekeeper 11/16/2019, 3:15 PM

## 2019-11-16 NOTE — Progress Notes (Signed)
[facility] MD Progress Note  11/16/2019 1:15 PM Elizzie Joseline Mccampbell  MRN:  [id number]   Subjective: Delisa reports, "I'm doing right, just having bad headaches right now"."  Objective: Haivyn Oravec is a 29 year old female with a past psychiatric history significant for substance-induced mood disorder, alcohol dependence, cocaine dependence who was admitted on 11/11/2019 secondary to depression, anxiety, homelessness and recent discovery of pregnancy. On evaluation today, the patient is sitting on the side of the bed in her room. She is alert and oriented x 4, less irritable, but cooperative. She reports her depression as 7 out of 10 and anxiety as 8 out of ten with 10 being he most severe. She reports that she continues to have suicidal thoughts without any intent or plans. She is able to verbally contract for safety while in the hospital. She denies any homicidal thoughts. She denies any auditory visual hallucinations. She reports some nausea. She is encouraged to request saltine crackers to assist with reducing nausea. She reports her appetite as "pretty good." Per chart review, she slept 6.75 hours last night.  Neliah is seen, chart reviewed. The chart findings discussed with the treatment team. She is lying down in her bed. She is verbally responsive. She is not as irritated as she was presenting in the last few days. She is not very visible on the unit as she is most of the time in her bed. She comes out to take her medicines & to go to cafeteria to get her meals. She is being encouraged to attend & participate in the group sessions. Today, she presents alert, making good eye contact. She says she doing alright, however, is complaining of headache pains. She does have hx of migraine headaches & some prn remedy for headaches ordered. She is encouraged to utilize her prn medications for her headaches. She remains pregnant waiting for a bed to open for her at [facility]. She says her depression is improving. She  denies any SIHI, AVH, delusional thoughts or paranoia. She does not appear to be responding to any internal stimuli. She is tolerating her treatment regimen with any adverse effects.  Principal Problem: Substance induced mood disorder (HCC) Diagnosis: Principal Problem:   Substance induced mood disorder (HCC) Active Problems:   Schizoaffective disorder, bipolar type (HCC)   Major depressive disorder, recurrent episode (HCC)  Total Time spent with patient: 15 minutes  Past Psychiatric History: See admission H&P  Past Medical History:  Past Medical History:  Diagnosis Date  . Crack cocaine use    History reviewed. No pertinent surgical history. Family History: History reviewed. No pertinent family history.   Family Psychiatric  History: See admission H&P  Social History:  Social History   Substance and Sexual Activity  Alcohol Use Yes  . Alcohol/week: 6.0 - 12.0 standard drinks  . Types: 6 - 12 Cans of beer per week     Social History   Substance and Sexual Activity  Drug Use Yes  . Frequency: 7.0 times per week  . Types: "Crack" cocaine   Comment: Daily use -- up to a gram    Social History   Socioeconomic History  . Marital status: Single    Spouse name: Not on file  . Number of children: Not on file  . Years of education: Not on file  . Highest education level: Not on file  Occupational History  . Occupation: Unemployed  Tobacco Use  . Smoking status: Current Every Day Smoker    Types: Cigarettes  . Smokeless  tobacco: Never Used  Substance and Sexual Activity  . Alcohol use: Yes    Alcohol/week: 6.0 - 12.0 standard drinks    Types: 6 - 12 Cans of beer per week  . Drug use: Yes    Frequency: 7.0 times per week    Types: "Crack" cocaine    Comment: Daily use -- up to a gram  . Sexual activity: Yes  Other Topics Concern  . Not on file  Social History Narrative   Client lives in [place]; unemployed; not actively seeking under care of psychiatrist,  recently stopped attending [facility].   Social Determinants of Health   Financial Resource Strain:   . Difficulty of Paying Living Expenses: Not on file  Food Insecurity:   . Worried About Programme researcher, broadcasting/film/video in the Last Year: Not on file  . Ran Out of Food in the Last Year: Not on file  Transportation Needs:   . Lack of Transportation (Medical): Not on file  . Lack of Transportation (Non-Medical): Not on file  Physical Activity:   . Days of Exercise per Week: Not on file  . Minutes of Exercise per Session: Not on file  Stress:   . Feeling of Stress : Not on file  Social Connections:   . Frequency of Communication with Friends and Family: Not on file  . Frequency of Social Gatherings with Friends and Family: Not on file  . Attends Religious Services: Not on file  . Active Member of Clubs or Organizations: Not on file  . Attends Banker Meetings: Not on file  . Marital Status: Not on file   Additional Social History:    Sleep: Good  Appetite:  Good  Current Medications: Current Facility-Administered Medications  Medication Dose Route Frequency Provider Last Rate Last Admin  . acetaminophen (TYLENOL) tablet 650 mg  650 mg Oral Q6H PRN Tonisha Silvey I, NP      . alum & mag hydroxide-simeth (MAALOX/MYLANTA) 200-200-20 MG/5ML suspension 30 mL  30 mL Oral Q4H PRN Trameka Dorough I, NP      . folic acid (FOLVITE) tablet 1 mg  1 mg Oral Daily Cobos, Rockey Situ, MD   1 mg at 11/16/19 3154  . hydrOXYzine (ATARAX/VISTARIL) tablet 25 mg  25 mg Oral Q6H PRN Antonieta Pert, MD   25 mg at 11/14/19 1827  . hydrOXYzine (ATARAX/VISTARIL) tablet 50 mg  50 mg Oral QHS PRN Antonieta Pert, MD   50 mg at 11/15/19 2103  . Lurasidone HCl TABS 60 mg  60 mg Oral Q breakfast Antonieta Pert, MD   60 mg at 11/16/19 0820  . magnesium hydroxide (MILK OF MAGNESIA) suspension 30 mL  30 mL Oral Daily PRN Armandina Stammer I, NP      . prenatal multivitamin tablet 1 tablet  1 tablet Oral Daily  Cobos, Rockey Situ, MD   1 tablet at 11/16/19 0820  . sertraline (ZOLOFT) tablet 75 mg  75 mg Oral Daily Antonieta Pert, MD   75 mg at 11/16/19 0086  . thiamine tablet 100 mg  100 mg Oral Daily Cobos, Rockey Situ, MD   100 mg at 11/16/19 7619    Lab Results:  No results found for this or any previous visit (from the past 48 hour(s)).  Blood Alcohol level:  Lab Results  Component Value Date   [facility] <10 11/09/2019   ETH <10 09/09/2019   Metabolic Disorder Labs: Lab Results  Component Value Date   HGBA1C 6.3 (  H) 04/13/2019   MPG 134.11 04/13/2019   No results found for: PROLACTIN Lab Results  Component Value Date   CHOL 191 04/13/2019   TRIG 125 04/13/2019   HDL 64 04/13/2019   CHOLHDL 3.0 04/13/2019   VLDL 25 04/13/2019   LDLCALC 102 (H) 04/13/2019   Physical Findings: AIMS: Facial and Oral Movements Muscles of Facial Expression: None, normal Lips and Perioral Area: None, normal Jaw: None, normal Tongue: None, normal,Extremity Movements Upper (arms, wrists, hands, fingers): None, normal Lower (legs, knees, ankles, toes): None, normal, Trunk Movements Neck, shoulders, hips: None, normal, Overall Severity Severity of abnormal movements (highest score from questions above): None, normal Incapacitation due to abnormal movements: None, normal Patient's awareness of abnormal movements (rate only patient's report): No Awareness, Dental Status Current problems with teeth and/or dentures?: No Does patient usually wear dentures?: No  CIWA:  CIWA-Ar Total: 1 COWS:  COWS Total Score: 2  Musculoskeletal: Strength & Muscle Tone: within normal limits Gait & Station: normal Patient leans: N/A  Psychiatric Specialty Exam: Physical Exam  Nursing note and vitals reviewed. Constitutional: She is oriented to person, place, and time. She appears well-developed and well-nourished.  HENT:  Head: Normocephalic and atraumatic.  Cardiovascular: Normal rate.  Respiratory: Effort normal.   Musculoskeletal:        General: Normal range of motion.  Neurological: She is alert and oriented to person, place, and time.  Skin: Skin is warm.  Psychiatric: Her speech is normal. Her mood appears anxious. She is not withdrawn and not actively hallucinating. Thought content is not paranoid and not delusional. She exhibits a depressed mood. She expresses suicidal ideation. She expresses no homicidal ideation. She expresses no suicidal plans.    Review of Systems  Constitutional: Negative for activity change, appetite change, chills, diaphoresis, fatigue, fever and unexpected weight change.  HENT: Negative for congestion, rhinorrhea, sneezing and sore throat.   Respiratory: Negative for cough and shortness of breath.   Cardiovascular: Negative for chest pain.  Gastrointestinal: Positive for nausea (related to pregnancy). Negative for diarrhea and vomiting.  Musculoskeletal: Negative.   Skin: Negative.   Neurological: Positive for headaches.  Psychiatric/Behavioral: Positive for agitation, decreased concentration, dysphoric mood (stable with medications) and suicidal ideas (Denies intent/plan. Verbally contracts for safety.). Negative for hallucinations, self-injury and sleep disturbance. The patient is nervous/anxious. The patient is not hyperactive.     Blood pressure 117/77, pulse 98, temperature 98.1 F (36.7 C), temperature source Oral, resp. rate 16, height 4\' 11"  (1.499 m), weight 88 kg, SpO2 99 %.Body mass index is 39.18 kg/m.  General Appearance: Casual  Eye Contact:  Good  Speech:  Normal Rate  Volume:  Normal  Mood:  States "mood is improving."  Affect:  Congruent  Thought Process:  Coherent and Descriptions of Associations: Intact  Orientation:  Full (Time, Place, and Person)  Thought Content:  Logical  Suicidal Thoughts:  No  Homicidal Thoughts:  No  Memory:  Immediate;   Fair Recent;   Fair Remote;   Fair  Judgement:  Intact  Insight:  Fair  Psychomotor Activity:   Normal  Concentration:  Concentration: Fair and Attention Span: Fair  Recall:  of Knowledge:  Good  Language:  Good  Akathisia:  Negative  Handed:  Right  AIMS (if indicated):     Assets:  Communication Skills Desire for Improvement Resilience  ADL's:  Intact  Cognition:  WNL  Sleep:  Number of Hours: 5.5   Treatment Plan Summary: Daily contact  with patient to assess and evaluate symptoms and progress in treatment, Medication management and Plan : Patient is seen and examined.  Patient is a 29 year old female with the above-stated past psychiatric history who is seen in follow-up.   -Continue inpatient hospitalization.  -Will continue today 11/16/2019 plan as below except where it is noted.  Diagnosis:  #1 substance-induced mood disorder,  #2 alcohol withdrawal,  #3 alcohol dependence,  #4 cocaine dependence,  #5 pregnancy  1.  Continue folic acid 1 mg p.o. daily for nutritional supplementation. 2.  Continue hydroxyzine 25 mg p.o. every 6 hours as needed anxiety. 3.  Continue hydroxyzine 50 mg p.o. nightly as needed insomnia. 4.  Discontinued lorazepam. 5.  Continue Latuda to 60 mg p.o. daily with breakfast for racing thoughts. 6.  Continue a prenatal vitamin 1 tablet p.o. daily for nutritional supplementation. 7.  Continue Zoloft 75 mg p.o. daily for anxiety and depression. 8.  Continue thiamine 100 mg p.o. daily for nutritional supplementation. 9.  Disposition planning-in progress.  Lindell Spar, NP, PMHNP, FNP-BC 11/16/2019, 1:15 PMPatient ID: Judene Companion, female   DOB: [birth year], 29 y.o.   MRN: [id number]

## 2019-11-16 NOTE — Progress Notes (Signed)
[facility] Group Notes:  (Nursing/MHT/Case Management/Adjunct)  Date:  11/16/2019  Time:  2030  Type of Therapy:  wrap up group  Participation Level:  Active  Participation Quality:  Appropriate, Attentive, Sharing and Supportive  Affect:  Appropriate and Irritable  Cognitive:  Appropriate  Insight:  Improving  Engagement in Group:  Engaged  Modes of Intervention:  Clarification, Education and Support  Summary of Progress/Problems: Pt enjoyed making a salad today. Pt plans on staying off the street and wants to reconnect and communicate better with her family. Pt shared that she is grateful for her family.   Marcille Buffy 11/16/2019, 9:27 PM

## 2019-11-16 NOTE — [facility] Group Notes (Signed)
[facility] Group Notes: (Clinical Social Work)   11/16/2019      Type of Therapy:  Group Therapy   Participation Level:  Did Not Attend - was invited both individually by MHT and by overhead announcement, chose not to attend.   Ambrose Mantle, LCSW 11/16/2019, 11:19 AM

## 2019-11-16 NOTE — Progress Notes (Signed)
D: Pt reported that she slept well last night.  Denied feeling anxious this morning.  Denied having SI/HI/AVH.    A: Medications administered per MD orders.  Actively listened to pt describe feelings.  Safety checks remain in place q 15 minutes.  R: Pt took medications without incident and no adverse reactions were noted.  Pt was pleasant during conversation with nurse.  Pt did not want to participate in milieu activities and has been in bed (other than going to get meals from the cafeteria) this shift.  RN will continue to monitor and provide support when needed.

## 2019-11-16 NOTE — [facility] Group Notes (Signed)
Adult Psychoeducational Group Note  Date:  11/16/2019 Time:  3:15 PM  Group Topic/Focus:  Goals Group:   The focus of this group is to help patients establish daily goals to achieve during treatment and discuss how the patient can incorporate goal setting into their daily lives to aide in recovery.  Participation Level:  Did Not Attend   Dione Housekeeper 11/16/2019, 3:15 PM

## 2019-11-17 ENCOUNTER — Encounter (HOSPITAL_COMMUNITY): Payer: Self-pay | Admitting: Obstetrics & Gynecology

## 2019-11-17 DIAGNOSIS — R109 Unspecified abdominal pain: Secondary | ICD-10-CM

## 2019-11-17 DIAGNOSIS — O26891 Other specified pregnancy related conditions, first trimester: Secondary | ICD-10-CM

## 2019-11-17 DIAGNOSIS — Z3A09 9 weeks gestation of pregnancy: Secondary | ICD-10-CM

## 2019-11-17 LAB — CBC
HCT: 41 % (ref 36.0–46.0)
Hemoglobin: 13.1 g/dL (ref 12.0–15.0)
MCH: 28.1 pg (ref 26.0–34.0)
MCHC: 32 g/dL (ref 30.0–36.0)
MCV: 88 fL (ref 80.0–100.0)
Platelets: 383 10*3/uL (ref 150–400)
RBC: 4.66 MIL/uL (ref 3.87–5.11)
RDW: 14.5 % (ref 11.5–15.5)
WBC: 11.2 10*3/uL — ABNORMAL HIGH (ref 4.0–10.5)
nRBC: 0 % (ref 0.0–0.2)

## 2019-11-17 LAB — URINALYSIS, ROUTINE W REFLEX MICROSCOPIC
Bilirubin Urine: NEGATIVE
Glucose, UA: NEGATIVE mg/dL
Hgb urine dipstick: NEGATIVE
Ketones, ur: NEGATIVE mg/dL
Leukocytes,Ua: NEGATIVE
Nitrite: NEGATIVE
Protein, ur: NEGATIVE mg/dL
Specific Gravity, Urine: 1.017 (ref 1.005–1.030)
pH: 6 (ref 5.0–8.0)

## 2019-11-17 LAB — HCG, QUANTITATIVE, PREGNANCY: hCG, Beta Chain, Quant, S: 218461 m[IU]/mL — ABNORMAL HIGH (ref <5)

## 2019-11-17 NOTE — Progress Notes (Signed)
The pt returned to the unit via EMS. The pt denied having any pain on arrival. She expressed that everything is okay with her baby and she was told it is normal to have some lower abd cramping during pregnancy. The pt shared her ultrasound picture with Clinical research associate and stated she's excited about the baby. Will continue to monitor the pt's condition.

## 2019-11-17 NOTE — MAU Note (Signed)
Pt requesting RN to call her father "Marjorie Deprey" and let him know that she is at the [facility]. RN called her father and was no able to leave a voicemail. Will update patient.

## 2019-11-17 NOTE — Consult Note (Signed)
Patient was seen and and evaluated sitting in bed eating breakfast. She is awake, alert and oriented.  She stated she is 2 months pregnant and is experiencing lower abdominal cramping that started roughly about 1 hour prior.  Denies spotting, bleeding or vaginal discharge.  Patient reports her cramps are intermittent.  Rating her pain 8 out of 10 with 10 being the worst. Vitals at 06:04 120/74 HR 100 RR 20.  NP called report to the MAU RN B Clelia Croft. Patient to be transported to [facility].

## 2019-11-17 NOTE — MAU Note (Signed)
Jordan Walton is a 29 y.o. at [redacted]w[redacted]d here in MAU reporting: lower abdominal pain that started this morning. No bleeding. Had some discharge yesterday, it was "light beige", no odor or itching.   LMP: 09/29/19 (approximately)  Onset of complaint: today  Pain score: 6/10  Vitals:   11/17/19 0604 11/17/19 0804  BP: 120/74 136/78  Pulse: 100 86  Resp: 20 16  Temp: 97.8 F (36.6 C) (!) 97.5 F (36.4 C)  SpO2: 100% 100%     Lab orders placed from triage: UA

## 2019-11-17 NOTE — Progress Notes (Signed)
[facility] MD Progress Note  11/17/2019 12:39 PM Jordan Walton  MRN:  [id number]   Subjective: Jordan Walton reports, "I'm fine, thank you".  Objective: Jordan Walton is a 29 year old female with a past psychiatric history significant for substance-induced mood disorder, alcohol dependence, cocaine dependence who was admitted on 11/11/2019 secondary to depression, anxiety, homelessness and recent discovery of pregnancy. On evaluation today, the patient is sitting on the side of the bed in her room. She is alert and oriented x 4, less irritable, but cooperative. She reports her depression as 7 out of 10 and anxiety as 8 out of ten with 10 being he most severe. She reports that she continues to have suicidal thoughts without any intent or plans. She is able to verbally contract for safety while in the hospital. She denies any homicidal thoughts. She denies any auditory visual hallucinations. She reports some nausea. She is encouraged to request saltine crackers to assist with reducing nausea. She reports her appetite as "pretty good." Per chart review, she slept 6.75 hours last night.  Jordan Walton is seen, chart reviewed. The chart findings discussed with the treatment team. She is lying down in her bed. She is verbally responsive. She says she did not want to talk right now. She says she is fine. She is just brought back by the EMS to [facility] from the [facility] hospital. She was taken to the women's hospital earlier this morning for complaining of lower abdominal cramps. Patient reported to the RN that she had ultra-sound done & the fetus is doing well. She added she was told that this kind of cramp she had was expected in early pregnancies. Jordan Walton did get up to go to the cafeteria to get her lunch. She currently denies any issues or concerns. She does not appear distressed. Will continue her current plan of care as already in progress.  Principal Problem: Substance induced mood disorder (HCC) Diagnosis: Principal  Problem:   Substance induced mood disorder (HCC) Active Problems:   Schizoaffective disorder, bipolar type (HCC)   Major depressive disorder, recurrent episode (HCC)  Total Time spent with patient: 15 minutes  Past Psychiatric History: See admission H&P  Past Medical History:  Past Medical History:  Diagnosis Date  . Crack cocaine use   . Herpes simplex virus (HSV) infection    History reviewed. No pertinent surgical history. Family History: History reviewed. No pertinent family history.   Family Psychiatric  History: See admission H&P  Social History:  Social History   Substance and Sexual Activity  Alcohol Use Yes  . Alcohol/week: 6.0 - 12.0 standard drinks  . Types: 6 - 12 Cans of beer per week   Comment: last was 11/04/19     Social History   Substance and Sexual Activity  Drug Use Yes  . Frequency: 7.0 times per week  . Types: "Crack" cocaine   Comment: Daily use -- up to a gram, last was 11/04/19    Social History   Socioeconomic History  . Marital status: Single    Spouse name: Not on file  . Number of children: Not on file  . Years of education: Not on file  . Highest education level: Not on file  Occupational History  . Occupation: Unemployed  Tobacco Use  . Smoking status: Current Every Day Smoker    Packs/day: 0.25    Types: Cigarettes  . Smokeless tobacco: Never Used  Substance and Sexual Activity  . Alcohol use: Yes    Alcohol/week: 6.0 - 12.0 standard drinks  Types: 6 - 12 Cans of beer per week    Comment: last was 11/04/19  . Drug use: Yes    Frequency: 7.0 times per week    Types: "Crack" cocaine    Comment: Daily use -- up to a gram, last was 11/04/19  . Sexual activity: Yes  Other Topics Concern  . Not on file  Social History Narrative   Client lives in [place]; unemployed; not actively seeking under care of psychiatrist, recently stopped attending [facility].   Social Determinants of Health   Financial Resource Strain:   .  Difficulty of Paying Living Expenses: Not on file  Food Insecurity:   . Worried About Programme researcher, broadcasting/film/video in the Last Year: Not on file  . Ran Out of Food in the Last Year: Not on file  Transportation Needs:   . Lack of Transportation (Medical): Not on file  . Lack of Transportation (Non-Medical): Not on file  Physical Activity:   . Days of Exercise per Week: Not on file  . Minutes of Exercise per Session: Not on file  Stress:   . Feeling of Stress : Not on file  Social Connections:   . Frequency of Communication with Friends and Family: Not on file  . Frequency of Social Gatherings with Friends and Family: Not on file  . Attends Religious Services: Not on file  . Active Member of Clubs or Organizations: Not on file  . Attends Banker Meetings: Not on file  . Marital Status: Not on file   Additional Social History:    Sleep: Good  Appetite:  Good  Current Medications: Current Facility-Administered Medications  Medication Dose Route Frequency Provider Last Rate Last Admin  . acetaminophen (TYLENOL) tablet 650 mg  650 mg Oral Q6H PRN Armandina Stammer I, NP   650 mg at 11/16/19 1638  . alum & mag hydroxide-simeth (MAALOX/MYLANTA) 200-200-20 MG/5ML suspension 30 mL  30 mL Oral Q4H PRN Maddox Bratcher I, NP      . folic acid (FOLVITE) tablet 1 mg  1 mg Oral Daily Cobos, Fernando A, MD   1 mg at 11/17/19 1136  . hydrOXYzine (ATARAX/VISTARIL) tablet 25 mg  25 mg Oral Q6H PRN Antonieta Pert, MD   25 mg at 11/16/19 1535  . hydrOXYzine (ATARAX/VISTARIL) tablet 50 mg  50 mg Oral QHS PRN Antonieta Pert, MD   50 mg at 11/16/19 2000  . Lurasidone HCl TABS 60 mg  60 mg Oral Q breakfast Antonieta Pert, MD   60 mg at 11/17/19 1136  . magnesium hydroxide (MILK OF MAGNESIA) suspension 30 mL  30 mL Oral Daily PRN Armandina Stammer I, NP      . prenatal multivitamin tablet 1 tablet  1 tablet Oral Daily Cobos, Rockey Situ, MD   1 tablet at 11/17/19 1137  . sertraline (ZOLOFT) tablet 75 mg   75 mg Oral Daily Antonieta Pert, MD   75 mg at 11/17/19 1137  . thiamine tablet 100 mg  100 mg Oral Daily Cobos, Rockey Situ, MD   100 mg at 11/17/19 1136   Lab Results:  Results for orders placed or performed during the hospital encounter of 11/09/19 (from the past 48 hour(s))  Urinalysis, Routine w reflex microscopic     Status: None   Collection Time: 11/17/19  8:03 AM  Result Value Ref Range   Color, Urine YELLOW YELLOW   APPearance CLEAR CLEAR   Specific Gravity, Urine 1.017 1.005 - 1.030  pH 6.0 5.0 - 8.0   Glucose, UA NEGATIVE NEGATIVE mg/dL   Hgb urine dipstick NEGATIVE NEGATIVE   Bilirubin Urine NEGATIVE NEGATIVE   Ketones, ur NEGATIVE NEGATIVE mg/dL   Protein, ur NEGATIVE NEGATIVE mg/dL   Nitrite NEGATIVE NEGATIVE   Leukocytes,Ua NEGATIVE NEGATIVE    Comment: Performed at [facility], [address] [place], [place] [address]  CBC     Status: Abnormal   Collection Time: 11/17/19  8:36 AM  Result Value Ref Range   WBC 11.2 (H) 4.0 - 10.5 K/uL   RBC 4.66 3.87 - 5.11 MIL/uL   Hemoglobin 13.1 12.0 - 15.0 g/dL   HCT 24.4 01.0 - 27.2 %   MCV 88.0 80.0 - 100.0 fL   MCH 28.1 26.0 - 34.0 pg   MCHC 32.0 30.0 - 36.0 g/dL   RDW 53.6 64.4 - 03.4 %   Platelets 383 150 - 400 K/uL   nRBC 0.0 0.0 - 0.2 %    Comment: Performed at [facility], [address], [place], [place] [address]  ABO/Rh     Status: None   Collection Time: 11/17/19  8:36 AM  Result Value Ref Range   ABO/RH(D)      B POS Performed at [facility], [address] [place], [place] [address]   hCG, quantitative, pregnancy     Status: Abnormal   Collection Time: 11/17/19  8:36 AM  Result Value Ref Range   hCG, Beta Chain, Quant, S 218,461 (H) <5 mIU/mL    Comment:          GEST. AGE      CONC.  (mIU/mL)   <=1 WEEK        5 - 50     2 WEEKS       50 - 500     3 WEEKS       100 - 10,000     4 WEEKS     1,000 - 30,000     5 WEEKS     3,500 - 115,000   6-8 WEEKS     12,000 - 270,000     12 WEEKS     15,000 - 220,000        FEMALE AND NON-PREGNANT FEMALE:     LESS THAN 5 mIU/mL Performed at [facility], [address] [place], [place] [address]     Blood Alcohol level:  Lab Results  Component Value Date   [facility] <10 11/09/2019   ETH <10 09/09/2019   Metabolic Disorder Labs: Lab Results  Component Value Date   HGBA1C 6.3 (H) 04/13/2019   MPG 134.11 04/13/2019   No results found for: PROLACTIN Lab Results  Component Value Date   CHOL 191 04/13/2019   TRIG 125 04/13/2019   HDL 64 04/13/2019   CHOLHDL 3.0 04/13/2019   VLDL 25 04/13/2019   LDLCALC 102 (H) 04/13/2019   Physical Findings: AIMS: Facial and Oral Movements Muscles of Facial Expression: None, normal Lips and Perioral Area: None, normal Jaw: None, normal Tongue: None, normal,Extremity Movements Upper (arms, wrists, hands, fingers): None, normal Lower (legs, knees, ankles, toes): None, normal, Trunk Movements Neck, shoulders, hips: None, normal, Overall Severity Severity of abnormal movements (highest score from questions above): None, normal Incapacitation due to abnormal movements: None, normal Patient's awareness of abnormal movements (rate only patient's report): No Awareness, Dental Status Current problems with teeth and/or dentures?: No Does patient usually wear dentures?: No  CIWA:  CIWA-Ar Total: 1 COWS:  COWS Total Score: 2  Musculoskeletal: Strength & Muscle Tone: within normal limits Gait & Station: normal Patient leans: N/A  Psychiatric Specialty Exam: Physical Exam  Nursing note and vitals reviewed. Constitutional: She is oriented to person, place, and time. She appears well-developed and well-nourished.  HENT:  Head: Normocephalic and atraumatic.  Cardiovascular: Normal rate.  Respiratory: Effort normal.  Musculoskeletal:        General: Normal range of motion.     Cervical back: Normal range of motion.  Neurological: She is alert and oriented to person, place, and  time.  Skin: Skin is warm.  Psychiatric: Her speech is normal. Her mood appears anxious. She is not withdrawn and not actively hallucinating. Thought content is not paranoid and not delusional. She exhibits a depressed mood. She expresses suicidal ideation. She expresses no homicidal ideation. She expresses no suicidal plans.    Review of Systems  Constitutional: Negative for activity change, appetite change, chills, diaphoresis, fatigue, fever and unexpected weight change.  HENT: Negative for congestion, rhinorrhea, sneezing and sore throat.   Respiratory: Negative for cough and shortness of breath.   Cardiovascular: Negative for chest pain.  Gastrointestinal: Positive for nausea (related to pregnancy). Negative for diarrhea and vomiting.  Musculoskeletal: Negative.   Skin: Negative.   Neurological: Positive for headaches.  Psychiatric/Behavioral: Positive for agitation, decreased concentration, dysphoric mood (stable with medications) and suicidal ideas (Denies intent/plan. Verbally contracts for safety.). Negative for hallucinations, self-injury and sleep disturbance. The patient is nervous/anxious. The patient is not hyperactive.     Blood pressure 129/66, pulse 86, temperature (!) 97.5 F (36.4 C), temperature source Oral, resp. rate 18, height 4\' 11"  (1.499 m), weight 88 kg, last menstrual period 09/29/2019, SpO2 100 %.Body mass index is 39.18 kg/m.  General Appearance: Casual  Eye Contact:  Good  Speech:  Normal Rate  Volume:  Normal  Mood:  States "mood is improving."  Affect:  Congruent  Thought Process:  Coherent and Descriptions of Associations: Intact  Orientation:  Full (Time, Place, and Person)  Thought Content:  Logical  Suicidal Thoughts:  No  Homicidal Thoughts:  No  Memory:  Immediate;   Fair Recent;   Fair Remote;   Fair  Judgement:  Intact  Insight:  Fair  Psychomotor Activity:  Normal  Concentration:  Concentration: Fair and Attention Span: Fair  Recall:  [employer] of Knowledge:  Good  Language:  Good  Akathisia:  Negative  Handed:  Right  AIMS (if indicated):     Assets:  Communication Skills Desire for Improvement Resilience  ADL's:  Intact  Cognition:  WNL  Sleep:  Number of Hours: 5.5   Treatment Plan Summary: Daily contact with patient to assess and evaluate symptoms and progress in treatment, Medication management and Plan : Patient is seen and examined.  Patient is a 29 year old female with the above-stated past psychiatric history who is seen in follow-up.   -Continue inpatient hospitalization.  -Will continue today 11/17/2019 plan as below except where it is noted.  Diagnosis:  #1 substance-induced mood disorder,  #2 alcohol withdrawal,  #3 alcohol dependence,  #4 cocaine dependence,  #5 pregnancy  1.  Continue folic acid 1 mg p.o. daily for nutritional supplementation. 2.  Continue hydroxyzine 25 mg p.o. every 6 hours as needed anxiety. 3.  Continue hydroxyzine 50 mg p.o. nightly as needed insomnia. 4.  Discontinued lorazepam. 5.  Continue Latuda to 60 mg p.o. daily with breakfast for racing thoughts. 6.  Continue a prenatal vitamin 1 tablet p.o. daily for nutritional supplementation. 7.  Continue Zoloft 75 mg p.o. daily for anxiety and depression. 8.  Continue thiamine 100 mg p.o. daily for nutritional supplementation. 9.  Disposition planning-in progress.  Armandina Stammer, NP, PMHNP, FNP-BC 11/17/2019, 12:39 PMPatient ID: Huntley Estelle, female   DOB: [birth year], 29 y.o.   MRN: [id number] Patient ID: Huntley Estelle, female   DOB: [birth year], 29 y.o.   MRN: [id number]

## 2019-11-17 NOTE — MAU Provider Note (Signed)
Chief Complaint: Abdominal Pain   First Provider Initiated Contact with Patient 11/17/19 [phone]     SUBJECTIVE HPI: Jordan Walton is a 29 y.o. G3P1011 at [redacted]w[redacted]d who presents to Maternity Admissions reporting abdominal pain. Patient was transferred to MAU from [facility] for evaluation of her pregnancy.  Reports intermittent lower abdominal pain since this morning. Pain is bilateral. Nothing makes better or worse. Last BM was yesterday and endorses some constipation which she hasn't been treating. Denies dysuria, n/v, vaginal bleeding, fever, or change in discharge.   Location: abdomen Quality: cramping Severity: 8/10 on pain scale Duration: hours Timing: intermittent Modifying factors: none Associated signs and symptoms: constipation  Past Medical History:  Diagnosis Date  . Crack cocaine use   . Herpes simplex virus (HSV) infection    OB History  Gravida Para Term Preterm AB Living  3 1 1   1 1   SAB TAB Ectopic Multiple Live Births    1          # Outcome Date GA Lbr Len/2nd Weight Sex Delivery Anes PTL Lv  3 Current           2 TAB           1 Term      Vag-Spont      History reviewed. No pertinent surgical history. Social History   Socioeconomic History  . Marital status: Single    Spouse name: Not on file  . Number of children: Not on file  . Years of education: Not on file  . Highest education level: Not on file  Occupational History  . Occupation: Unemployed  Tobacco Use  . Smoking status: Current Every Day Smoker    Packs/day: 0.25    Types: Cigarettes  . Smokeless tobacco: Never Used  Substance and Sexual Activity  . Alcohol use: Yes    Alcohol/week: 6.0 - 12.0 standard drinks    Types: 6 - 12 Cans of beer per week    Comment: last was 11/04/19  . Drug use: Yes    Frequency: 7.0 times per week    Types: "Crack" cocaine    Comment: Daily use -- up to a gram, last was 11/04/19  . Sexual activity: Yes  Other Topics Concern  . Not on file  Social History  Narrative   Client lives in [place]; unemployed; not actively seeking under care of psychiatrist, recently stopped attending [facility].   Social Determinants of Health   Financial Resource Strain:   . Difficulty of Paying Living Expenses: Not on file  Food Insecurity:   . Worried About Charity fundraiser in the Last Year: Not on file  . Ran Out of Food in the Last Year: Not on file  Transportation Needs:   . Lack of Transportation (Medical): Not on file  . Lack of Transportation (Non-Medical): Not on file  Physical Activity:   . Days of Exercise per Week: Not on file  . Minutes of Exercise per Session: Not on file  Stress:   . Feeling of Stress : Not on file  Social Connections:   . Frequency of Communication with Friends and Family: Not on file  . Frequency of Social Gatherings with Friends and Family: Not on file  . Attends Religious Services: Not on file  . Active Member of Clubs or Organizations: Not on file  . Attends Archivist Meetings: Not on file  . Marital Status: Not on file  Intimate Partner Violence:   . Fear of  Current or Ex-Partner: Not on file  . Emotionally Abused: Not on file  . Physically Abused: Not on file  . Sexually Abused: Not on file   History reviewed. No pertinent family history. No current facility-administered medications on file prior to encounter.   No current outpatient medications on file prior to encounter.   No Known Allergies  I have reviewed patient's Past Medical Hx, Surgical Hx, Family Hx, Social Hx, medications and allergies.   Review of Systems  Constitutional: Negative.   Gastrointestinal: Positive for abdominal pain and constipation. Negative for diarrhea, nausea and vomiting.  Genitourinary: Negative.     OBJECTIVE Patient Vitals for the past 24 hrs:  BP Temp Temp src Pulse Resp SpO2  11/17/19 0804 136/78 (!) 97.5 F (36.4 C) Oral 86 16 100 %  11/17/19 0604 120/74 97.8 F (36.6 C) Oral 100 20 100 %  11/17/19  0601 128/79 -- -- 85 -- --   Constitutional: Well-developed, well-nourished female in no acute distress.  Cardiovascular: normal rate & rhythm, no murmur Respiratory: normal rate and effort. Lung sounds clear throughout GI: Abd soft, non-tender, Pos BS x 4. No guarding or rebound tenderness MS: Extremities nontender, no edema, normal ROM Neurologic: Alert and oriented x 4.      LAB RESULTS Results for orders placed or performed during the hospital encounter of 11/09/19 (from the past 24 hour(s))  Urinalysis, Routine w reflex microscopic     Status: None   Collection Time: 11/17/19  8:03 AM  Result Value Ref Range   Color, Urine YELLOW YELLOW   APPearance CLEAR CLEAR   Specific Gravity, Urine 1.017 1.005 - 1.030   pH 6.0 5.0 - 8.0   Glucose, UA NEGATIVE NEGATIVE mg/dL   Hgb urine dipstick NEGATIVE NEGATIVE   Bilirubin Urine NEGATIVE NEGATIVE   Ketones, ur NEGATIVE NEGATIVE mg/dL   Protein, ur NEGATIVE NEGATIVE mg/dL   Nitrite NEGATIVE NEGATIVE   Leukocytes,Ua NEGATIVE NEGATIVE  CBC     Status: Abnormal   Collection Time: 11/17/19  8:36 AM  Result Value Ref Range   WBC 11.2 (H) 4.0 - 10.5 K/uL   RBC 4.66 3.87 - 5.11 MIL/uL   Hemoglobin 13.1 12.0 - 15.0 g/dL   HCT 61.9 50.9 - 32.6 %   MCV 88.0 80.0 - 100.0 fL   MCH 28.1 26.0 - 34.0 pg   MCHC 32.0 30.0 - 36.0 g/dL   RDW 71.2 45.8 - 09.9 %   Platelets 383 150 - 400 K/uL   nRBC 0.0 0.0 - 0.2 %  ABO/Rh     Status: None (Preliminary result)   Collection Time: 11/17/19  8:36 AM  Result Value Ref Range   ABO/RH(D) PENDING     IMAGING No results found.  MAU COURSE Orders Placed This Encounter  Procedures  . US OB LESS THAN 14 WEEKS WITH OB TRANSVAGINAL  . RPR  . HIV-1 RNA quant-no reflex-bld  . CBC with Differential/Platelet  . Urinalysis, Routine w reflex microscopic  . Diet heart healthy/carb modified Room service appropriate? Yes; Fluid consistency: Thin  . Activity as tolerated  . Level of observation  . Vital  signs  . Notify physician - Call MD / PA if:  . Vital signs while awake x 48 hours, then BID x 48 hours, then per unit protocol  . I certify that the inpatient psychiatric hospital admission was medically necessary for either:  . Full code  . Consult to Transition of Care  . ABO/Rh  . Admit to  BH IP adult   Meds ordered this encounter  Medications  . DISCONTD: acetaminophen (TYLENOL) tablet 650 mg  . DISCONTD: alum & mag hydroxide-simeth (MAALOX/MYLANTA) 200-200-20 MG/5ML suspension 30 mL  . DISCONTD: hydrOXYzine (ATARAX/VISTARIL) tablet 25 mg  . DISCONTD: magnesium hydroxide (MILK OF MAGNESIA) suspension 30 mL  . DISCONTD: prenatal vitamin w/FE, FA (NATACHEW) chewable tablet 1 tablet  . DISCONTD: thiamine (B-1) injection 100 mg  . thiamine tablet 100 mg  . LORazepam (ATIVAN) tablet 1 mg  . DISCONTD: hydrOXYzine (ATARAX/VISTARIL) tablet 25 mg  . DISCONTD: loperamide (IMODIUM) capsule 2-4 mg  . DISCONTD: ondansetron (ZOFRAN-ODT) disintegrating tablet 4 mg  . folic acid (FOLVITE) tablet 1 mg  . prenatal multivitamin tablet 1 tablet  . DISCONTD: sertraline (ZOLOFT) tablet 50 mg  . DISCONTD: ARIPiprazole (ABILIFY) tablet 5 mg  . DISCONTD: lurasidone (LATUDA) tablet 20 mg  . DISCONTD: lurasidone (LATUDA) tablet 40 mg  . hydrOXYzine (ATARAX/VISTARIL) tablet 25 mg  . hydrOXYzine (ATARAX/VISTARIL) tablet 50 mg  . acetaminophen (TYLENOL) tablet 650 mg  . alum & mag hydroxide-simeth (MAALOX/MYLANTA) 200-200-20 MG/5ML suspension 30 mL  . magnesium hydroxide (MILK OF MAGNESIA) suspension 30 mL  . Lurasidone HCl TABS 60 mg  . sertraline (ZOLOFT) tablet 75 mg    MDM On 2/9, she had an ultrasound that confirmed IUP. Bedside performed today.  Pt informed that the ultrasound is considered a limited OB ultrasound and is not intended to be a complete ultrasound exam.  Patient also informed that the ultrasound is not being completed with the intent of assessing for fetal or placental anomalies or  any pelvic abnormalities.  Explained that the purpose of today's ultrasound is to assess for  viability.  Patient acknowledges the purpose of the exam and the limitations of the study.  Live IUP with FHR 158 bpm  Patient in no apparent distress.   GC/CT negative on 2/7  Dr. Jola Babinski [facility]) notified of patient's assessment. She is cleared obstetrically & will be transferred back to [facility].     ASSESSMENT 1. Normal IUP (intrauterine pregnancy) on prenatal ultrasound, first trimester   2. Pregnancy   3. Abdominal pain during pregnancy in first trimester   4. [redacted] weeks gestation of pregnancy     PLAN Transfer to [facility] Reviewed reasons to return to MAU Recommend stool softeners for constipation   Judeth Horn, NP 11/17/2019  9:02 AM

## 2019-11-17 NOTE — Progress Notes (Signed)
   11/16/19 2011  Psychosocial Assessment  Patient Complaints Irritability;Depression  Eye Contact Fair  Affect Depressed  Speech Argumentative  Interaction Superficial  Motor Activity Fidgety  Appearance/Hygiene Unremarkable  Mood Depressed;Irritable  Thought Process  Coherency WDL  Content WDL  Delusions None reported or observed  Perception WDL  Hallucination None reported or observed  Judgment Limited  Confusion None  Danger to Self  Current suicidal ideation? Denies  Self-Injurious Behavior No self-injurious ideation or behavior indicators observed or expressed

## 2019-11-17 NOTE — MAU Note (Signed)
Pt denies any suicidal thoughts today. Pt reported suicidal ideation to staff at [facility]. Pt door to remain open and all cords/sharps removed from the room.

## 2019-11-17 NOTE — Discharge Instructions (Signed)
Abdominal Pain During Pregnancy  Belly (abdominal) pain is common during pregnancy. There are many possible causes. Most of the time, it is not a serious problem. Other times, it can be a sign that something is wrong with the pregnancy. Always tell your doctor if you have belly pain. Follow these instructions at home:  Do not have sex or put anything in your vagina until your pain goes away completely.  Get plenty of rest until your pain gets better.  Drink enough fluid to keep your pee (urine) pale yellow.  Take over-the-counter and prescription medicines only as told by your doctor.  Keep all follow-up visits as told by your doctor. This is important. Contact a doctor if:  Your pain continues or gets worse after resting.  You have lower belly pain that: ? Comes and goes at regular times. ? Spreads to your back. ? Feels like menstrual cramps.  You have pain or burning when you pee (urinate). Get help right away if:  You have a fever or chills.  You have vaginal bleeding.  You are leaking fluid from your vagina.  You are passing tissue from your vagina.  You throw up (vomit) for more than 24 hours.  You have watery poop (diarrhea) for more than 24 hours.  Your baby is moving less than usual.  You feel very weak or faint.  You have shortness of breath.  You have very bad pain in your upper belly. Summary  Belly (abdominal) pain is common during pregnancy. There are many possible causes.  If you have belly pain during pregnancy, tell your doctor right away.  Keep all follow-up visits as told by your doctor. This is important. This information is not intended to replace advice given to you by your health care provider. Make sure you discuss any questions you have with your health care provider. Document Revised: 01/07/2019 Document Reviewed: 12/22/2016 Elsevier Patient Education  2020 Elsevier Inc.  

## 2019-11-18 LAB — ABO/RH: ABO/RH(D): B POS

## 2019-11-18 MED ORDER — PRENATAL MULTIVITAMIN CH: 1.0000 | ORAL_TABLET | Freq: Every day | ORAL | 0 refills | Status: DC

## 2019-11-18 MED ORDER — HYDROXYZINE HCL 25 MG PO TABS: 25.0000 mg | ORAL_TABLET | Freq: Four times a day (QID) | ORAL | 0 refills | Status: DC | PRN

## 2019-11-18 MED ORDER — LURASIDONE HCL 60 MG PO TABS: 60.0000 mg | ORAL_TABLET | Freq: Every day | ORAL | 0 refills | Status: DC

## 2019-11-18 MED ORDER — SERTRALINE HCL 25 MG PO TABS: 75.0000 mg | ORAL_TABLET | Freq: Every day | ORAL | 0 refills | Status: DC

## 2019-11-18 NOTE — Progress Notes (Signed)
Recreation Therapy Notes  Date:  2.15.21 Time: 0930 Location: [address] Dayroom  Group Topic: Stress Management  Goal Area(s) Addresses:  Patient will identify positive stress management techniques. Patient will identify benefits of using stress management post d/c.  Intervention: Stress Management  Activity :  Meditation. LRT played a meditation that focused on letting go of the past and focusing on the present.  Patients were to listen and follow along as the meditation played to engage in activity.  Education:  Stress Management, Discharge Planning.   Education Outcome: Acknowledges Education  Clinical Observations/Feedback: Pt did not attend activity.   Tarance Balan, LRT/CTRS         Jahree Dermody A 11/18/2019 11:42 AM 

## 2019-11-18 NOTE — [facility] Group Notes (Signed)
LCSW Group Therapy Notes 11/18/2019 1:46 PM  Type of Therapy and Topic: Group Therapy: Overcoming Obstacles  Participation Level: Did Not Attend  Description of Group:  In this group patients will be encouraged to explore what they see as obstacles to their own wellness and recovery. They will be guided to discuss their thoughts, feelings, and behaviors related to these obstacles. The group will process together ways to cope with barriers, with attention given to specific choices patients can make. Each patient will be challenged to identify changes they are motivated to make in order to overcome their obstacles. This group will be process-oriented, with patients participating in exploration of their own experiences as well as giving and receiving support and challenge from other group members.  Therapeutic Goals: 1. Patient will identify personal and current obstacles as they relate to admission. 2. Patient will identify barriers that currently interfere with their wellness or overcoming obstacles.  3. Patient will identify feelings, thought process and behaviors related to these barriers. 4. Patient will identify two changes they are willing to make to overcome these obstacles:   Summary of Patient Progress  Invited, did not attend.   Therapeutic Modalities:  Cognitive Behavioral Therapy Solution Focused Therapy Motivational Interviewing Relapse Prevention Therapy  Enid Cutter, MSW, [facility] 11/18/2019 1:46 PM

## 2019-11-18 NOTE — Progress Notes (Signed)
Psychoeducational Group Note  Date:  11/18/2019 Time:  2125  Group Topic/Focus:  Wrap-Up Group:   The focus of this group is to help patients review their daily goal of treatment and discuss progress on daily workbooks.  Participation Level: Did Not Attend  Participation Quality:  Not Applicable  Affect:  Not Applicable  Cognitive:  Not Applicable  Insight:  Not Applicable  Engagement in Group: Not Applicable  Additional Comments:  The patient did not attend group this evening.   Hazle Coca S 11/18/2019, 9:25 PM

## 2019-11-18 NOTE — Progress Notes (Signed)
[facility] MD Progress Note  11/18/2019 10:58 AM Marinell Ahtziri Jeffries  MRN:  [id number] Subjective:   Patient is a 29 year old female with a past psychiatric history significant for substance-induced mood disorder, alcohol dependence, cocaine dependence who was admitted on 11/11/2019 secondary to depression, anxiety, homelessness and recent discovery of pregnancy.  Objective: Patient is seen and examined.  Patient is a 29 year old female with the above-stated past psychiatric history who is seen in follow-up.  From a psychiatric perspective she is doing well.  Her mood has significantly improved during the course of the hospitalization.  She had some abdominal pain and some anxiety yesterday.  She was sent to the [facility], and was evaluated by an OB provider.  Her evaluation was negative at that time and she returned back to our facility.  She denied any auditory or visual hallucinations.  She denied any suicidal or homicidal ideation.  She is smiling and engaging this morning.  She also found out that she has been accepted at [facility].  She is to be sent to their facility tomorrow morning.  She is encouraged and happy about that.  She denied any side effects to her current medications.  Her vital signs are stable, she is afebrile.  She slept approximately 6 hours last night.  Her CBC from 2/14 was all essentially normal.  Principal Problem: Substance induced mood disorder (HCC) Diagnosis: Principal Problem:   Substance induced mood disorder (HCC) Active Problems:   Schizoaffective disorder, bipolar type (HCC)   Major depressive disorder, recurrent episode (HCC)  Total Time spent with patient: 15 minutes  Past Psychiatric History: See admission H&P  Past Medical History:  Past Medical History:  Diagnosis Date  . Crack cocaine use   . Herpes simplex virus (HSV) infection    History reviewed. No pertinent surgical history. Family History: History reviewed. No pertinent family history. Family  Psychiatric  History: See admission H&P Social History:  Social History   Substance and Sexual Activity  Alcohol Use Yes  . Alcohol/week: 6.0 - 12.0 standard drinks  . Types: 6 - 12 Cans of beer per week   Comment: last was 11/04/19     Social History   Substance and Sexual Activity  Drug Use Yes  . Frequency: 7.0 times per week  . Types: "Crack" cocaine   Comment: Daily use -- up to a gram, last was 11/04/19    Social History   Socioeconomic History  . Marital status: Single    Spouse name: Not on file  . Number of children: Not on file  . Years of education: Not on file  . Highest education level: Not on file  Occupational History  . Occupation: Unemployed  Tobacco Use  . Smoking status: Current Every Day Smoker    Packs/day: 0.25    Types: Cigarettes  . Smokeless tobacco: Never Used  Substance and Sexual Activity  . Alcohol use: Yes    Alcohol/week: 6.0 - 12.0 standard drinks    Types: 6 - 12 Cans of beer per week    Comment: last was 11/04/19  . Drug use: Yes    Frequency: 7.0 times per week    Types: "Crack" cocaine    Comment: Daily use -- up to a gram, last was 11/04/19  . Sexual activity: Yes  Other Topics Concern  . Not on file  Social History Narrative   Client lives in [place]; unemployed; not actively seeking under care of psychiatrist, recently stopped attending [employer].   Social Determinants of  Health   Financial Resource Strain:   . Difficulty of Paying Living Expenses: Not on file  Food Insecurity:   . Worried About Charity fundraiser in the Last Year: Not on file  . Ran Out of Food in the Last Year: Not on file  Transportation Needs:   . Lack of Transportation (Medical): Not on file  . Lack of Transportation (Non-Medical): Not on file  Physical Activity:   . Days of Exercise per Week: Not on file  . Minutes of Exercise per Session: Not on file  Stress:   . Feeling of Stress : Not on file  Social Connections:   . Frequency of  Communication with Friends and Family: Not on file  . Frequency of Social Gatherings with Friends and Family: Not on file  . Attends Religious Services: Not on file  . Active Member of Clubs or Organizations: Not on file  . Attends Archivist Meetings: Not on file  . Marital Status: Not on file   Additional Social History:                         Sleep: Fair  Appetite:  Good  Current Medications: Current Facility-Administered Medications  Medication Dose Route Frequency Provider Last Rate Last Admin  . acetaminophen (TYLENOL) tablet 650 mg  650 mg Oral Q6H PRN Lindell Spar I, NP   650 mg at 11/16/19 1638  . alum & mag hydroxide-simeth (MAALOX/MYLANTA) 200-200-20 MG/5ML suspension 30 mL  30 mL Oral Q4H PRN Nwoko, Agnes I, NP      . folic acid (FOLVITE) tablet 1 mg  1 mg Oral Daily Cobos, Myer Peer, MD   1 mg at 11/18/19 0748  . hydrOXYzine (ATARAX/VISTARIL) tablet 25 mg  25 mg Oral Q6H PRN Sharma Covert, MD   25 mg at 11/16/19 1535  . hydrOXYzine (ATARAX/VISTARIL) tablet 50 mg  50 mg Oral QHS PRN Sharma Covert, MD   50 mg at 11/17/19 2156  . Lurasidone HCl TABS 60 mg  60 mg Oral Q breakfast Sharma Covert, MD   60 mg at 11/18/19 0748  . magnesium hydroxide (MILK OF MAGNESIA) suspension 30 mL  30 mL Oral Daily PRN Lindell Spar I, NP      . prenatal multivitamin tablet 1 tablet  1 tablet Oral Daily Cobos, Myer Peer, MD   1 tablet at 11/18/19 0748  . sertraline (ZOLOFT) tablet 75 mg  75 mg Oral Daily Sharma Covert, MD   75 mg at 11/18/19 0748  . thiamine tablet 100 mg  100 mg Oral Daily Cobos, Myer Peer, MD   100 mg at 11/18/19 9629    Lab Results:  Results for orders placed or performed during the hospital encounter of 11/09/19 (from the past 48 hour(s))  Urinalysis, Routine w reflex microscopic     Status: None   Collection Time: 11/17/19  8:03 AM  Result Value Ref Range   Color, Urine YELLOW YELLOW   APPearance CLEAR CLEAR   Specific  Gravity, Urine 1.017 1.005 - 1.030   pH 6.0 5.0 - 8.0   Glucose, UA NEGATIVE NEGATIVE mg/dL   Hgb urine dipstick NEGATIVE NEGATIVE   Bilirubin Urine NEGATIVE NEGATIVE   Ketones, ur NEGATIVE NEGATIVE mg/dL   Protein, ur NEGATIVE NEGATIVE mg/dL   Nitrite NEGATIVE NEGATIVE   Leukocytes,Ua NEGATIVE NEGATIVE    Comment: Performed at [facility] [address] [place], [place] [address]  CBC     Status: Abnormal   Collection Time: 11/17/19  8:36 AM  Result Value Ref Range   WBC 11.2 (H) 4.0 - 10.5 K/uL   RBC 4.66 3.87 - 5.11 MIL/uL   Hemoglobin 13.1 12.0 - 15.0 g/dL   HCT 78.2 95.6 - 21.3 %   MCV 88.0 80.0 - 100.0 fL   MCH 28.1 26.0 - 34.0 pg   MCHC 32.0 30.0 - 36.0 g/dL   RDW 08.6 57.8 - 46.9 %   Platelets 383 150 - 400 K/uL   nRBC 0.0 0.0 - 0.2 %    Comment: Performed at [facility], [address], [place], [place] [address]  ABO/Rh     Status: None   Collection Time: 11/17/19  8:36 AM  Result Value Ref Range   ABO/RH(D)      B POS Performed at [facility], [address], [place], [place] [address]   hCG, quantitative, pregnancy     Status: Abnormal   Collection Time: 11/17/19  8:36 AM  Result Value Ref Range   hCG, Beta Chain, Quant, S 218,461 (H) <5 mIU/mL    Comment:          GEST. AGE      CONC.  (mIU/mL)   <=1 WEEK        5 - 50     2 WEEKS       50 - 500     3 WEEKS       100 - 10,000     4 WEEKS     1,000 - 30,000     5 WEEKS     3,500 - 115,000   6-8 WEEKS     12,000 - 270,000    12 WEEKS     15,000 - 220,000        FEMALE AND NON-PREGNANT FEMALE:     LESS THAN 5 mIU/mL Performed at [facility], [address], [place], [place] [address]     Blood Alcohol level:  Lab Results  Component Value Date   [facility] <10 11/09/2019   ETH <10 09/09/2019    Metabolic Disorder Labs: Lab Results  Component Value Date   HGBA1C 6.3 (H) 04/13/2019   MPG 134.11 04/13/2019   No results found for: PROLACTIN Lab Results  Component Value Date    CHOL 191 04/13/2019   TRIG 125 04/13/2019   HDL 64 04/13/2019   CHOLHDL 3.0 04/13/2019   VLDL 25 04/13/2019   LDLCALC 102 (H) 04/13/2019    Physical Findings: AIMS: Facial and Oral Movements Muscles of Facial Expression: None, normal Lips and Perioral Area: None, normal Jaw: None, normal Tongue: None, normal,Extremity Movements Upper (arms, wrists, hands, fingers): None, normal Lower (legs, knees, ankles, toes): None, normal, Trunk Movements Neck, shoulders, hips: None, normal, Overall Severity Severity of abnormal movements (highest score from questions above): None, normal Incapacitation due to abnormal movements: None, normal Patient's awareness of abnormal movements (rate only patient's report): No Awareness, Dental Status Current problems with teeth and/or dentures?: No Does patient usually wear dentures?: No  CIWA:  CIWA-Ar Total: 1 COWS:  COWS Total Score: 2  Musculoskeletal: Strength & Muscle Tone: within normal limits Gait & Station: normal Patient leans: N/A  Psychiatric Specialty Exam: Physical Exam  Nursing note and vitals reviewed. Constitutional: She is oriented to person, place, and time. She appears well-developed and well-nourished.  HENT:  Head: Normocephalic and atraumatic.  Respiratory: Effort normal.  Neurological:  She is alert and oriented to person, place, and time.    Review of Systems  Blood pressure 120/87, pulse 98, temperature 97.9 F (36.6 C), resp. rate 20, height 4\' 11"  (1.499 m), weight 88 kg, last menstrual period 09/29/2019, SpO2 99 %.Body mass index is 39.18 kg/m.  General Appearance: Casual  Eye Contact:  Good  Speech:  Normal Rate  Volume:  Normal  Mood:  Euthymic  Affect:  Congruent  Thought Process:  Coherent and Descriptions of Associations: Intact  Orientation:  Full (Time, Place, and Person)  Thought Content:  Logical  Suicidal Thoughts:  No  Homicidal Thoughts:  No  Memory:  Immediate;   Fair Recent;   Fair Remote;    Fair  Judgement:  Intact  Insight:  Fair  Psychomotor Activity:  Normal  Concentration:  Concentration: Good and Attention Span: Good  Recall:  Good  Fund of Knowledge:  Good  Language:  Good  Akathisia:  Negative  Handed:  Right  AIMS (if indicated):     Assets:  Desire for Improvement Resilience  ADL's:  Intact  Cognition:  WNL  Sleep:  Number of Hours: 5.5     Treatment Plan Summary: Daily contact with patient to assess and evaluate symptoms and progress in treatment, Medication management and Plan : Patient is seen and examined.  Patient is a 29 year old female with the above-stated past psychiatric history who is seen in follow-up.  Diagnosis: #1 substance-induced mood disorder, #2 alcohol withdrawal, #3 alcohol dependence, #4 cocaine dependence, #5 pregnancy  Patient is seen in follow-up.  She is doing well.  No problems with withdrawal or side effects of medications.  She has been accepted to [facility].  She will be discharged there tomorrow.  1.  Continue folic acid 1 mg p.o. daily for nutritional supplementation. 2.  Continue hydroxyzine 25 mg p.o. every 6 hours as needed anxiety. 3.  Continue hydroxyzine 50 mg p.o. nightly as needed insomnia. 4.  Continue Latuda 60 mg p.o. daily for mood stability. 5.  Continue prenatal multivitamin 1 tablet p.o. daily for nutritional supplementation. 6.  Continue Zoloft 75 mg p.o. daily for depression and anxiety. 7.  Continue thiamine 100 mg p.o. daily for nutritional supplementation. 8.  Discharge to [facility] tomorrow morning for residential treatment. [facility], MD 11/18/2019, 10:58 AM

## 2019-11-18 NOTE — [facility] Suicide Risk Assessment (Signed)
[facility] Discharge Suicide Risk Assessment   Principal Problem: Substance induced mood disorder (HCC) Discharge Diagnoses: Principal Problem:   Substance induced mood disorder (HCC) Active Problems:   Schizoaffective disorder, bipolar type (HCC)   Major depressive disorder, recurrent episode (HCC)   Total Time spent with patient: 20 minutes  Musculoskeletal: Strength & Muscle Tone: within normal limits Gait & Station: normal Patient leans: N/A  Psychiatric Specialty Exam: Review of Systems  All other systems reviewed and are negative.   Blood pressure 120/87, pulse 98, temperature 97.9 F (36.6 C), resp. rate 20, height 4\' 11"  (1.499 m), weight 88 kg, last menstrual period 09/29/2019, SpO2 99 %.Body mass index is 39.18 kg/m.  General Appearance: Casual  Eye Contact::  Good  Speech:  Normal Rate409  Volume:  Normal  Mood:  Euthymic  Affect:  Congruent  Thought Process:  Coherent and Descriptions of Associations: Intact  Orientation:  Full (Time, Place, and Person)  Thought Content:  Logical  Suicidal Thoughts:  No  Homicidal Thoughts:  No  Memory:  Immediate;   Good Recent;   Good Remote;   Good  Judgement:  Intact  Insight:  Fair  Psychomotor Activity:  Normal  Concentration:  Good  Recall:  Good  Fund of Knowledge:Good  Language: Good  Akathisia:  Negative  Handed:  Right  AIMS (if indicated):     Assets:  Desire for Improvement Resilience  Sleep:  Number of Hours: 5.5  Cognition: WNL  ADL's:  Intact   Mental Status Per Nursing Assessment::   On Admission:  Suicidal ideation indicated by patient  Demographic Factors:  Low socioeconomic status, Living alone and Unemployed  Loss Factors: Financial problems/change in socioeconomic status  Historical Factors: Impulsivity  Risk Reduction Factors:   NA  Continued Clinical Symptoms:  Depression:   Comorbid alcohol abuse/dependence Impulsivity Alcohol/Substance Abuse/Dependencies  Cognitive Features That  Contribute To Risk:  None    Suicide Risk:  Minimal: No identifiable suicidal ideation.  Patients presenting with no risk factors but with morbid ruminations; may be classified as minimal risk based on the severity of the depressive symptoms  Follow-up Information    [facility] Follow up.   Contact information: [address] [place] [address]           Plan Of Care/Follow-up recommendations:  Activity:  ad lib  [phone], MD 11/18/2019, 1:15 PM
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
## 2019-11-18 NOTE — BHH Counselor (Signed)
This patient has been accepted for residential substance use treatment at [facility] tomorrow (02/16). She needs to arrive at 10am for intake.  Enid Cutter, MSW, LCSW-A Clinical Social Worker [facility] Adult Unit  [phone]

## 2019-11-18 NOTE — Progress Notes (Signed)
D:  Patient's self inventory sheet, patient has poor sleep, sleep medication not helpful.  Fair appetite, normal energy level.  Rated depression 5, hopeless 9, anxiety 10.  Withdrawals, agitation, cravings, nausea.  Denied SI.  Denied physical problems.  Denied physical pain.  No discharge plans.  Wants to discharge where they accept pregnant women. A:  Medications administered per MD orders.  Emotional support and encouragement given patient. R:  Denied SI and HI, contracts for safety.  Denied A/V hallucinations.  Safety maintained with 15 minute checks.

## 2019-11-19 NOTE — Progress Notes (Signed)
   11/17/19 2227  Psych Admission Type (Psych Patients Only)  Admission Status Voluntary  Psychosocial Assessment  Patient Complaints Anxiety  Eye Contact Brief  Facial Expression Animated  Affect Anxious  Speech Logical/coherent  Interaction Assertive  Motor Activity Fidgety  Appearance/Hygiene Disheveled  Behavior Characteristics Cooperative  Mood Pleasant  Thought Process  Coherency WDL  Content WDL  Delusions None reported or observed  Perception WDL  Hallucination None reported or observed  Judgment Limited  Confusion None  Danger to Self  Current suicidal ideation? Denies  Self-Injurious Behavior No self-injurious ideation or behavior indicators observed or expressed   Agreement Not to Harm Self Yes  Description of Agreement Verbal  Danger to Others  Danger to Others None reported or observed  D: Patient in dayroom engaging with peers. Pt reports she is excited about discharge tomorrow.  A: Medications administered as prescribed. Support and encouragement provided as needed.  R: Patient remains safe on the unit. Will continue to monitor for safety and stability.

## 2019-11-19 NOTE — Progress Notes (Signed)
  [facility] Adult Case Management Discharge Plan :  Will you be returning to the same living situation after discharge:  No. Patient discharged to [facility] for residential treatment and continuity of care.  At discharge, do you have transportation home?: Yes,  CSW arranged Safe Transport for the patient Do you have the ability to pay for your medications: No.  Release of information consent forms completed and in the chart;  Patient's signature needed at discharge.  Patient to Follow up at: Follow-up Information    [facility]. Go on 11/19/2019.   Why: Accepted for residential treatment on Tuesday, 11/19/2019 at 10:00am. Please be sure you take you 30 day prescriptions and any discharge paperwork from this hospitalization.  Contact information: [address] [place] [address] [phone]        [facility]. Go to.   Why: Upon completion of residential treatment program, please follow up for any outpatient medication management and therapy services. Walk-in hours are Monday- Friday from 8:00am-5:00pm.  Contact information: [address] [place] [address] [phone]           Next level of care provider has access to [facility] Link:yes  Safety Planning and Suicide Prevention discussed: Yes,  with the patient's father     Has patient been referred to the Quitline?: Patient refused referral  Patient has been referred for addiction treatment: Yes  Maeola Sarah, LCSWA 11/19/2019, 9:15 AM

## 2019-11-19 NOTE — Progress Notes (Signed)
RN met with pt and reviewed pt's discharge instructions.  Pt verbalized understanding of discharge instructions and pt did not have any questions. RN returned pt's belongings  to pt.   Prescriptions were given to pt.  Pt denied SI/HI/AVH and voiced no concerns.  Pt was appreciative of the care pt received at [facility].  Patient discharged to the lobby without incident.

## 2020-01-27 ENCOUNTER — Emergency Department (HOSPITAL_COMMUNITY)
Admission: EM | Admit: 2020-01-27 | Discharge: 2020-01-27 | Disposition: A | Discharge status: Home / Self Care | Payer: Medicaid - Dental | Attending: Emergency Medicine | Admitting: Emergency Medicine

## 2020-01-27 ENCOUNTER — Encounter (HOSPITAL_COMMUNITY): Payer: Self-pay

## 2020-01-27 ENCOUNTER — Other Ambulatory Visit: Payer: Self-pay

## 2020-01-27 DIAGNOSIS — F17210 Nicotine dependence, cigarettes, uncomplicated: Secondary | ICD-10-CM | POA: Insufficient documentation

## 2020-01-27 DIAGNOSIS — O200 Threatened abortion: Secondary | ICD-10-CM

## 2020-01-27 DIAGNOSIS — Z3A19 19 weeks gestation of pregnancy: Secondary | ICD-10-CM | POA: Insufficient documentation

## 2020-01-27 DIAGNOSIS — O99332 Smoking (tobacco) complicating pregnancy, second trimester: Secondary | ICD-10-CM | POA: Insufficient documentation

## 2020-01-27 DIAGNOSIS — Z79899 Other long term (current) drug therapy: Secondary | ICD-10-CM | POA: Insufficient documentation

## 2020-01-27 LAB — URINALYSIS, ROUTINE W REFLEX MICROSCOPIC
Bilirubin Urine: NEGATIVE
Glucose, UA: NEGATIVE mg/dL
Hgb urine dipstick: NEGATIVE
Ketones, ur: 5 mg/dL — AB
Leukocytes,Ua: NEGATIVE
Nitrite: NEGATIVE
Protein, ur: NEGATIVE mg/dL
Specific Gravity, Urine: 1.01 (ref 1.005–1.030)
pH: 6 (ref 5.0–8.0)

## 2020-01-27 LAB — HCG, QUANTITATIVE, PREGNANCY: hCG, Beta Chain, Quant, S: 30249 m[IU]/mL — ABNORMAL HIGH (ref <5)

## 2020-01-27 NOTE — ED Provider Notes (Signed)
[facility] EMERGENCY DEPARTMENT Provider Note   CSN: [id number] Arrival date & time: 01/27/20  9629     History Chief Complaint  Patient presents with  . Abdominal Pain    Jordan Walton is a 29 y.o. female.  Patient presenting with some vaginal spotting and some lower abdominal cramping.  Patient had an ultrasound done February 9 showing a IUP with heartbeat.  Patient's had some substance abuse problems.  Patient now in a home rehab environment.  Has not had OB follow-up yet.  Patient gravida 3 para 1 estimated due date is September 18.  Making her 19 weeks 2 days pregnant today.  On February 14 patient had ABO Rh done that showed she was B positive.  Patient without any severe abdominal pain or any heavy bleeding.        Past Medical History:  Diagnosis Date  . Crack cocaine use   . Herpes simplex virus (HSV) infection     Patient Active Problem List   Diagnosis Date Noted  . Major depressive disorder, recurrent episode ([place]) 11/14/2019  . Schizoaffective disorder, bipolar type ([place]) 11/09/2019  . Substance induced mood disorder ([place]) 04/11/2019  . Cocaine abuse ([place]) 04/13/2017  . Cocaine abuse with cocaine-induced mood disorder ([place]) 04/13/2017    History reviewed. No pertinent surgical history.   OB History    Gravida  3   Para  1   Term  1   Preterm      AB  1   Living  1     SAB      TAB  1   Ectopic      Multiple      Live Births              History reviewed. No pertinent family history.  Social History   Tobacco Use  . Smoking status: Former Smoker    Packs/day: 0.25    Types: Cigarettes  . Smokeless tobacco: Never Used  Substance Use Topics  . Alcohol use: Not Currently    Alcohol/week: 6.0 - 12.0 standard drinks    Types: 6 - 12 Cans of beer per week    Comment: last was 11/04/19  . Drug use: Not Currently    Frequency: 7.0 times per week    Types: "Crack" cocaine    Comment: Daily use -- up to a gram, last was  11/04/19    Home Medications Prior to Admission medications   Medication Sig Start Date End Date Taking? Authorizing Provider  hydrOXYzine (ATARAX/VISTARIL) 25 MG tablet Take 1 tablet (25 mg total) by mouth every 6 (six) hours as needed for anxiety. 11/18/19   Connye Burkitt, NP  Lurasidone HCl 60 MG TABS Take 1 tablet (60 mg total) by mouth daily with breakfast. 11/19/19   Connye Burkitt, NP  Prenatal Vit-Fe Fumarate-FA (PRENATAL MULTIVITAMIN) TABS tablet Take 1 tablet by mouth daily. 11/19/19   Connye Burkitt, NP  sertraline (ZOLOFT) 25 MG tablet Take 3 tablets (75 mg total) by mouth daily. 11/19/19   Connye Burkitt, NP    Allergies    Patient has no known allergies.  Review of Systems   Review of Systems  Constitutional: Negative for chills and fever.  HENT: Negative for rhinorrhea and sore throat.   Eyes: Negative for visual disturbance.  Respiratory: Negative for cough and shortness of breath.   Cardiovascular: Negative for chest pain and leg swelling.  Gastrointestinal: Positive for abdominal pain. Negative for diarrhea, nausea and  vomiting.  Genitourinary: Positive for vaginal bleeding. Negative for dysuria and vaginal discharge.  Musculoskeletal: Negative for back pain and neck pain.  Skin: Negative for rash.  Neurological: Negative for dizziness, light-headedness and headaches.  Hematological: Does not bruise/bleed easily.  Psychiatric/Behavioral: Negative for confusion.    Physical Exam Updated Vital Signs BP (!) 100/59   Pulse 78   Temp 98.2 F (36.8 C) (Oral)   Resp (!) 23   Ht 1.499 m (4\' 11" )   Wt 88.5 kg   LMP 09/29/2019 (Approximate) Comment: Pt not sure  SpO2 98%   BMI 39.39 kg/m   Physical Exam Vitals and nursing note reviewed.  Constitutional:      General: She is not in acute distress.    Appearance: Normal appearance. She is well-developed.  HENT:     Head: Normocephalic and atraumatic.  Eyes:     Extraocular Movements: Extraocular movements  intact.     Conjunctiva/sclera: Conjunctivae normal.     Pupils: Pupils are equal, round, and reactive to light.  Cardiovascular:     Rate and Rhythm: Normal rate and regular rhythm.     Heart sounds: No murmur.  Pulmonary:     Effort: Pulmonary effort is normal. No respiratory distress.     Breath sounds: Normal breath sounds.  Abdominal:     General: There is no distension.     Palpations: Abdomen is soft.     Tenderness: There is no abdominal tenderness. There is no guarding.  Musculoskeletal:        General: Normal range of motion.     Cervical back: Normal range of motion and neck supple.  Skin:    General: Skin is warm and dry.  Neurological:     General: No focal deficit present.     Mental Status: She is alert and oriented to person, place, and time.     ED Results / Procedures / Treatments   Labs (all labs ordered are listed, but only abnormal results are displayed) Labs Reviewed  URINALYSIS, ROUTINE W REFLEX MICROSCOPIC - Abnormal; Notable for the following components:      Result Value   APPearance HAZY (*)    Ketones, ur 5 (*)    All other components within normal limits  HCG, QUANTITATIVE, PREGNANCY    EKG None  Radiology No results found.  Procedures Procedures (including critical care time)  Medications Ordered in ED Medications - No data to display  ED Course  I have reviewed the triage vital signs and the nursing notes.  Pertinent labs & imaging results that were available during my care of the patient were reviewed by me and considered in my medical decision making (see chart for details).    MDM Rules/Calculators/A&P                      Patient [redacted] weeks pregnant approximately.  Gravida 3 para 1.  Due date is September 18.  Patient's abdomen soft nontender.  Urinalysis negative.  Quantitative hCG has been ordered and is pending.  Patient Rh+.  As per her blood typing in February.  Patient had ultrasound done in February that showed an  intrauterine pregnancy.  Symptoms could be related to miscarriage.  Will give patient referral to OB/GYN precautions provided.  Quantitative hCG done as a benchmark.  Patient stable for discharge home.    Final Clinical Impression(s) / ED Diagnoses Final diagnoses:  [redacted] weeks gestation of pregnancy  Threatened miscarriage in early pregnancy  Rx / DC Orders ED Discharge Orders    None       Vanetta Mulders, MD 01/27/20 1219

## 2020-01-27 NOTE — Discharge Instructions (Addendum)
Urinalysis negative for urinary tract infection.  Your quantitative pregnancy hormone number is pending.  Your OB/GYN will be able to use that as a benchmark to see where he started.  Return for any worse bleeding or worse abdominal pain.  Make an appointment to [facility] with [facility].  Based on your dates today are approximately [redacted] weeks pregnant.  Baby had good fetal heart tones.

## 2020-01-27 NOTE — ED Triage Notes (Addendum)
Pt c/o abdominal cramping, bleeding, and increased vaginal discharge. Increased discharge x1 week. Cramping and vaginal bleeding since yesterday.   Pt denies any new sexual partners. Reports last intercourse was 3 months ago. Denies vaginal discharge odor or color change.  Pt reports wearing panty liners since yesterday. Spotting small amounts of bright red blood.   Pt currently resides at [facility]. States she has been clean from alcohol and drug use for 3 months.   Pt denies receiving any type of prenatal care. Reports pt has been in rehab and has not been able to receive care due to this.

## 2020-02-06 ENCOUNTER — Ambulatory Visit (INDEPENDENT_AMBULATORY_CARE_PROVIDER_SITE_OTHER): Payer: Self-pay | Admitting: *Deleted

## 2020-02-06 ENCOUNTER — Other Ambulatory Visit: Payer: Self-pay

## 2020-02-06 DIAGNOSIS — R45851 Suicidal ideations: Secondary | ICD-10-CM

## 2020-02-06 DIAGNOSIS — F1910 Other psychoactive substance abuse, uncomplicated: Secondary | ICD-10-CM

## 2020-02-06 DIAGNOSIS — Z8619 Personal history of other infectious and parasitic diseases: Secondary | ICD-10-CM

## 2020-02-06 DIAGNOSIS — Z658 Other specified problems related to psychosocial circumstances: Secondary | ICD-10-CM

## 2020-02-06 DIAGNOSIS — F1490 Cocaine use, unspecified, uncomplicated: Secondary | ICD-10-CM

## 2020-02-06 DIAGNOSIS — F1020 Alcohol dependence, uncomplicated: Secondary | ICD-10-CM

## 2020-02-06 DIAGNOSIS — F332 Major depressive disorder, recurrent severe without psychotic features: Secondary | ICD-10-CM

## 2020-02-06 DIAGNOSIS — Z8659 Personal history of other mental and behavioral disorders: Secondary | ICD-10-CM

## 2020-02-06 DIAGNOSIS — O0990 Supervision of high risk pregnancy, unspecified, unspecified trimester: Secondary | ICD-10-CM

## 2020-02-06 DIAGNOSIS — O99210 Obesity complicating pregnancy, unspecified trimester: Secondary | ICD-10-CM | POA: Insufficient documentation

## 2020-02-06 DIAGNOSIS — F17200 Nicotine dependence, unspecified, uncomplicated: Secondary | ICD-10-CM

## 2020-02-06 NOTE — Progress Notes (Signed)
I connected with  Jordan Walton on 02/06/20 at  3:30 PM EDT by telephone and verified that I am speaking with the correct person using two identifiers.   I discussed the limitations, risks, security and privacy concerns of performing an evaluation and management service by telephone and the availability of in person appointments. I also discussed with the patient that there may be a patient responsible charge related to this service. The patient expressed understanding and agreed to proceed.  I explained I am completing her New OB Intake today. We discussed Her EDD and that it is based on  Early [place]. I reviewed her allergies, meds, OB History, Medical /Surgical history, and appropriate screenings. I informed her of [facility] services. She reports she has history of living on the streets , alcohol and drug abuse ; but currently lives in [facility]. She does know FOB.  She does not have a smart phone. I informed her we would like her to check her blood pressure weekly during her pregnancy. She confirmed with staff at [facility] they can check her blood pressure weekly. I asked her to write it down with date and report to doctor at each visit.  I explained she will have some visits in office and some virtually. She already has MyChart account.  I reviewed her new ob  appointment date/ time with her , our location and to wear mask, no visitors.  I explained she will have a pelvic exam, ob bloodwork, hemoglobin a1C, cbg ,pap, and  genetic testing if desired, I scheduled an Korea at 19 weeks and gave her the appointment. She voices understanding.   Jordan Gilmer,RN 02/06/2020  3:45 PM

## 2020-02-06 NOTE — Patient Instructions (Signed)

## 2020-02-10 ENCOUNTER — Ambulatory Visit: Payer: Self-pay

## 2020-02-12 NOTE — BH Specialist Note (Signed)
Pt did not arrive to video visit, did not answer the phone,  And was not in the lobby either on the first or second floors of the [facility]; Left HIPPA-compliant message to call back Asher Muir from [employer] for [employer] at [facility] at [phone] (main office) or [phone] ([facility]'s office); left MyChart message for patient.    Integrated Behavioral Health Initial Visit  MRN: [id number] Name: Robi Mitter Lissandra Keil, LCSW

## 2020-02-19 ENCOUNTER — Telehealth: Payer: Self-pay | Admitting: Obstetrics and Gynecology

## 2020-02-19 NOTE — Telephone Encounter (Signed)
Attempted to reach patient about her appointment being changed because she needed to see a different provider.

## 2020-02-20 ENCOUNTER — Encounter: Payer: Medicaid - Dental | Admitting: Obstetrics and Gynecology

## 2020-02-20 ENCOUNTER — Encounter: Payer: Self-pay | Admitting: Obstetrics and Gynecology

## 2020-02-20 ENCOUNTER — Ambulatory Visit (INDEPENDENT_AMBULATORY_CARE_PROVIDER_SITE_OTHER): Payer: Self-pay | Admitting: Obstetrics and Gynecology

## 2020-02-20 ENCOUNTER — Ambulatory Visit: Payer: Self-pay | Admitting: Clinical

## 2020-02-20 ENCOUNTER — Ambulatory Visit: Payer: Self-pay | Admitting: *Deleted

## 2020-02-20 ENCOUNTER — Ambulatory Visit: Payer: Self-pay | Attending: Obstetrics and Gynecology

## 2020-02-20 ENCOUNTER — Other Ambulatory Visit: Payer: Self-pay

## 2020-02-20 ENCOUNTER — Other Ambulatory Visit (HOSPITAL_COMMUNITY)
Admission: RE | Admit: 2020-02-20 | Discharge: 2020-02-20 | Disposition: A | Discharge status: Home / Self Care | Payer: Self-pay | Source: Ambulatory Visit | Attending: Obstetrics and Gynecology | Admitting: Obstetrics and Gynecology

## 2020-02-20 VITALS — BP 113/64 | HR 83 | Wt 204.5 lb

## 2020-02-20 DIAGNOSIS — O99312 Alcohol use complicating pregnancy, second trimester: Secondary | ICD-10-CM

## 2020-02-20 DIAGNOSIS — F3130 Bipolar disorder, current episode depressed, mild or moderate severity, unspecified: Secondary | ICD-10-CM

## 2020-02-20 DIAGNOSIS — F17200 Nicotine dependence, unspecified, uncomplicated: Secondary | ICD-10-CM | POA: Insufficient documentation

## 2020-02-20 DIAGNOSIS — O99210 Obesity complicating pregnancy, unspecified trimester: Secondary | ICD-10-CM

## 2020-02-20 DIAGNOSIS — F1020 Alcohol dependence, uncomplicated: Secondary | ICD-10-CM | POA: Insufficient documentation

## 2020-02-20 DIAGNOSIS — E669 Obesity, unspecified: Secondary | ICD-10-CM

## 2020-02-20 DIAGNOSIS — F1490 Cocaine use, unspecified, uncomplicated: Secondary | ICD-10-CM

## 2020-02-20 DIAGNOSIS — Z3A22 22 weeks gestation of pregnancy: Secondary | ICD-10-CM

## 2020-02-20 DIAGNOSIS — Z8659 Personal history of other mental and behavioral disorders: Secondary | ICD-10-CM

## 2020-02-20 DIAGNOSIS — O99332 Smoking (tobacco) complicating pregnancy, second trimester: Secondary | ICD-10-CM

## 2020-02-20 DIAGNOSIS — O0990 Supervision of high risk pregnancy, unspecified, unspecified trimester: Secondary | ICD-10-CM

## 2020-02-20 DIAGNOSIS — Z8619 Personal history of other infectious and parasitic diseases: Secondary | ICD-10-CM

## 2020-02-20 DIAGNOSIS — Z91199 Patient's noncompliance with other medical treatment and regimen due to unspecified reason: Secondary | ICD-10-CM

## 2020-02-20 DIAGNOSIS — Z658 Other specified problems related to psychosocial circumstances: Secondary | ICD-10-CM | POA: Insufficient documentation

## 2020-02-20 DIAGNOSIS — R45851 Suicidal ideations: Secondary | ICD-10-CM | POA: Insufficient documentation

## 2020-02-20 DIAGNOSIS — O99322 Drug use complicating pregnancy, second trimester: Secondary | ICD-10-CM

## 2020-02-20 DIAGNOSIS — O99212 Obesity complicating pregnancy, second trimester: Secondary | ICD-10-CM

## 2020-02-20 DIAGNOSIS — O99342 Other mental disorders complicating pregnancy, second trimester: Secondary | ICD-10-CM

## 2020-02-20 DIAGNOSIS — Z8759 Personal history of other complications of pregnancy, childbirth and the puerperium: Secondary | ICD-10-CM | POA: Insufficient documentation

## 2020-02-20 DIAGNOSIS — F1910 Other psychoactive substance abuse, uncomplicated: Secondary | ICD-10-CM | POA: Insufficient documentation

## 2020-02-20 DIAGNOSIS — Z363 Encounter for antenatal screening for malformations: Secondary | ICD-10-CM

## 2020-02-20 MED ORDER — ASPIRIN EC 81 MG PO TBEC
81.0000 mg | DELAYED_RELEASE_TABLET | Freq: Every day | ORAL | 2 refills | Status: DC
Start: 2020-02-20 — End: 2021-02-19

## 2020-02-20 NOTE — Progress Notes (Signed)
INITIAL PRENATAL VISIT NOTE  Subjective:  Jordan Walton is a 29 y.o. G3P1011 at [redacted]w[redacted]d by 8 weeks [place] being seen today for her initial prenatal visit. This is an unplanned pregnancy. She is living in [facility], a residential sobriety program. Previously used cocaine, has been clean since 11/2019. She was using nothing for birth control previously. She has an obstetric history significant for 1 term SVD, IOL for gestational hypertension. She has a medical history significant for cocaine and alcohol abuse, HSV.  Patient reports no complaints.  Contractions: Not present. Vag. Bleeding: None.  Movement: Present. Denies leaking of fluid.   Past Medical History:  Diagnosis Date  . Crack cocaine use   . Herpes simplex virus (HSV) infection   . Pregnancy induced hypertension     History reviewed. No pertinent surgical history.  OB History  Gravida Para Term Preterm AB Living  3 1 1   1 1   SAB TAB Ectopic Multiple Live Births    1     1    # Outcome Date GA Lbr Len/2nd Weight Sex Delivery Anes PTL Lv  3 Current           2 TAB 2013          1 Term 07/08/09   5 lb 8 oz (2.495 kg)  Vag-Spont   LIV     Birth Comments: Induced for HTN    Social History   Socioeconomic History  . Marital status: Single    Spouse name: Not on file  . Number of children: Not on file  . Years of education: Not on file  . Highest education level: Not on file  Occupational History  . Occupation: Unemployed  Tobacco Use  . Smoking status: Former Smoker    Packs/day: 0.25    Types: Cigarettes  . Smokeless tobacco: Never Used  Substance and Sexual Activity  . Alcohol use: Not Currently    Alcohol/week: 6.0 - 12.0 standard drinks    Types: 6 - 12 Cans of beer per week    Comment: last was 11/04/19  . Drug use: Not Currently    Frequency: 7.0 times per week    Types: "Crack" cocaine    Comment: Daily use -- up to a gram, last was 11/04/19  . Sexual activity: Yes    Birth control/protection: None    Other Topics Concern  . Not on file  Social History Narrative   Client lives in [place]; unemployed; not actively seeking under care of psychiatrist, recently stopped attending [facility].   Social Determinants of Health   Financial Resource Strain:   . Difficulty of Paying Living Expenses:   Food Insecurity: Food Insecurity Present  . Worried About [place] in the Last Year: Often true  . Ran Out of Food in the Last Year: Often true  Transportation Needs: Unmet Transportation Needs  . Lack of Transportation (Medical): Yes  . Lack of Transportation (Non-Medical): Yes  Physical Activity:   . Days of Exercise per Week:   . Minutes of Exercise per Session:   Stress:   . Feeling of Stress :   Social Connections:   . Frequency of Communication with Friends and Family:   . Frequency of Social Gatherings with Friends and Family:   . Attends Religious Services:   . Active Member of Clubs or Organizations:   . Attends Programme researcher, broadcasting/film/video Meetings:   Banker Marital Status:     Family History  Problem Relation Age  of Onset  . Hypertension Father      Current Outpatient Medications:  .  Cholecalciferol (VITAMIN D3) 50 MCG (2000 UT) capsule, Take 2,000 Units by mouth daily., Disp: , Rfl:  .  folic acid (FOLVITE) 1 MG tablet, Take 1 mg by mouth daily., Disp: , Rfl:  .  Prenatal Vit-Fe Fumarate-FA (PRENATAL MULTIVITAMIN) TABS tablet, Take 1 tablet by mouth daily., Disp: 30 tablet, Rfl: 0 .  sertraline (ZOLOFT) 25 MG tablet, Take 3 tablets (75 mg total) by mouth daily., Disp: 90 tablet, Rfl: 0 .  SV MELATONIN 3 MG TBDP, DISSOLVE 1 TABLET BY MOUTH ONCE DAILY AT BEDTIME AS NEEDED FOR SLEEP, Disp: , Rfl:  .  aspirin EC 81 MG tablet, Take 1 tablet (81 mg total) by mouth daily. Take after 12 weeks for prevention of preeclampsia later in pregnancy, Disp: 300 tablet, Rfl: 2 .  hydrOXYzine (ATARAX/VISTARIL) 25 MG tablet, Take 1 tablet (25 mg total) by mouth every 6 (six) hours as needed  for anxiety. (Patient not taking: Reported on 02/20/2020), Disp: 30 tablet, Rfl: 0  No Known Allergies  Review of Systems: Negative except for what is mentioned in HPI.  Objective:   Vitals:   02/20/20 1009  BP: 113/64  Pulse: 83  Weight: 204 lb 8 oz (92.8 kg)    Fetal Status: Fetal Heart Rate (bpm): 148   Movement: Present     Physical Exam: BP 113/64   Pulse 83   Wt 204 lb 8 oz (92.8 kg)   LMP 09/29/2019 (Approximate) Comment: Pt not sure  BMI 41.30 kg/m  CONSTITUTIONAL: Well-developed, well-nourished female in no acute distress.  NEUROLOGIC: Alert and oriented to person, place, and time. Normal reflexes, muscle tone coordination. No cranial nerve deficit noted. PSYCHIATRIC: Normal mood and affect. Normal behavior. Normal judgment and thought content. SKIN: Skin is warm and dry. No rash noted. Not diaphoretic. No erythema. No pallor. HENT:  Normocephalic, atraumatic, External right and left ear normal. Oropharynx is clear and moist EYES: Conjunctivae and EOM are normal. Pupils are equal, round, and reactive to light. No scleral icterus.  NECK: Normal range of motion, supple, no masses CARDIOVASCULAR: Normal heart rate noted, regular rhythm RESPIRATORY: Effort and breath sounds normal, no problems with respiration noted BREASTS: deferred ABDOMEN: Soft, nontender, nondistended, gravid. GU: normal appearing external female genitalia, multiparous normal appearing cervix, scant white discharge in vagina, no lesions noted MUSCULOSKELETAL: Normal range of motion. EXT:  No edema and no tenderness. 2+ distal pulses.   Assessment and Plan:  Pregnancy: G3P1011 at [redacted]w[redacted]d by 8 weeks [place]  1. Supervision of high risk pregnancy, antepartum - Comprehensive metabolic panel - Culture, OB Urine - Genetic Screening - Obstetric Panel, Including HIV - Protein / creatinine ratio, urine - TSH - Urine drugs of abuse scrn w alc, routine ([facility] [place]) - HgB A1c Reviewed  [facility] [employer] structure, multiple providers, fellows, medical students, virtual visits, MyChart.   2. History of herpes genitalis Last outbreak 11 years ago Will need ppx  3. History of gestational hypertension Induced at term Start aspirin  4. Crack cocaine use Has been clean since 11/2019, living in sobriety house - Urine drugs of abuse scrn w alc, routine ([facility] [place] [facility])  5. Uncomplicated alcohol dependence (HCC) Sober since 11/2019  6. Bipolar disorder, current episode depressed, mild or moderate severity, unspecified (HCC) On zoloft, doing well Declines BH today  7. Obesity in pregnancy  8. Psychosocial stressors Lives in  [facility], sober living house for 1 year Started 11/2019   Preterm labor symptoms and general obstetric precautions including but not limited to vaginal bleeding, contractions, leaking of fluid and fetal movement were reviewed in detail with the patient.  Please refer to After Visit Summary for other counseling recommendations.   Return in about 4 weeks (around 03/19/2020) for high OB, in person.  Sloan Leiter 02/20/2020 10:50 AM

## 2020-02-20 NOTE — Addendum Note (Signed)
Addended by: Henrietta Dine on: 02/20/2020 12:02 PM   Modules accepted: Orders

## 2020-02-21 ENCOUNTER — Other Ambulatory Visit: Payer: Self-pay | Admitting: *Deleted

## 2020-02-21 DIAGNOSIS — O99212 Obesity complicating pregnancy, second trimester: Secondary | ICD-10-CM

## 2020-02-21 LAB — CERVICOVAGINAL ANCILLARY ONLY
Chlamydia: NEGATIVE
Comment: NEGATIVE
Comment: NORMAL
Neisseria Gonorrhea: NEGATIVE

## 2020-02-21 LAB — COMPREHENSIVE METABOLIC PANEL
ALT: 12 IU/L (ref 0–32)
AST: 13 IU/L (ref 0–40)
Albumin/Globulin Ratio: 1.3 (ref 1.2–2.2)
Albumin: 3.6 g/dL — ABNORMAL LOW (ref 3.9–5.0)
Alkaline Phosphatase: 67 IU/L (ref 48–121)
BUN/Creatinine Ratio: 15 (ref 9–23)
BUN: 8 mg/dL (ref 6–20)
Bilirubin Total: <0.2 mg/dL (ref 0.0–1.2)
CO2: 19 mmol/L — ABNORMAL LOW (ref 20–29)
Calcium: 9.2 mg/dL (ref 8.7–10.2)
Chloride: 104 mmol/L (ref 96–106)
Creatinine, Ser: 0.55 mg/dL — ABNORMAL LOW (ref 0.57–1.00)
GFR calc Af Amer: 146 mL/min/{1.73_m2} (ref >59)
GFR calc non Af Amer: 126 mL/min/{1.73_m2} (ref >59)
Globulin, Total: 2.7 g/dL (ref 1.5–4.5)
Glucose: 73 mg/dL (ref 65–99)
Potassium: 4.5 mmol/L (ref 3.5–5.2)
Sodium: 135 mmol/L (ref 134–144)
Total Protein: 6.3 g/dL (ref 6.0–8.5)

## 2020-02-21 LAB — OBSTETRIC PANEL, INCLUDING HIV
Antibody Screen: NEGATIVE
Basophils Absolute: 0 10*3/uL (ref 0.0–0.2)
Basos: 0 %
EOS (ABSOLUTE): 0.3 10*3/uL (ref 0.0–0.4)
Eos: 2 %
HIV Screen 4th Generation wRfx: NONREACTIVE
Hematocrit: 30.8 % — ABNORMAL LOW (ref 34.0–46.6)
Hemoglobin: 10.4 g/dL — ABNORMAL LOW (ref 11.1–15.9)
Hepatitis B Surface Ag: NEGATIVE
Immature Grans (Abs): 0 10*3/uL (ref 0.0–0.1)
Immature Granulocytes: 0 %
Lymphocytes Absolute: 3.2 10*3/uL — ABNORMAL HIGH (ref 0.7–3.1)
Lymphs: 26 %
MCH: 28.7 pg (ref 26.6–33.0)
MCHC: 33.8 g/dL (ref 31.5–35.7)
MCV: 85 fL (ref 79–97)
Monocytes Absolute: 0.8 10*3/uL (ref 0.1–0.9)
Monocytes: 6 %
Neutrophils Absolute: 8.1 10*3/uL — ABNORMAL HIGH (ref 1.4–7.0)
Neutrophils: 66 %
Platelets: 341 10*3/uL (ref 150–450)
RBC: 3.62 x10E6/uL — ABNORMAL LOW (ref 3.77–5.28)
RDW: 13.3 % (ref 11.7–15.4)
RPR Ser Ql: NONREACTIVE
Rh Factor: POSITIVE
Rubella Antibodies, IGG: 1.94 index (ref >0.99)
WBC: 12.5 10*3/uL — ABNORMAL HIGH (ref 3.4–10.8)

## 2020-02-21 LAB — HEMOGLOBIN A1C
Est. average glucose Bld gHb Est-mCnc: 126 mg/dL
Hgb A1c MFr Bld: 6 % — ABNORMAL HIGH (ref 4.8–5.6)

## 2020-02-21 LAB — URINE DRUGS OF ABUSE SCREEN W ALC, ROUTINE (REF LAB)
Amphetamines, Urine: NEGATIVE ng/mL
Barbiturate Quant, Ur: NEGATIVE ng/mL
Benzodiazepine Quant, Ur: NEGATIVE ng/mL
Cannabinoid Quant, Ur: NEGATIVE ng/mL
Cocaine (Metab.): NEGATIVE ng/mL
Ethanol, Urine: NEGATIVE %
Methadone Screen, Urine: NEGATIVE ng/mL
Opiate Quant, Ur: NEGATIVE ng/mL
PCP Quant, Ur: NEGATIVE ng/mL
Propoxyphene: NEGATIVE ng/mL

## 2020-02-21 LAB — TSH: TSH: 2.2 u[IU]/mL (ref 0.450–4.500)

## 2020-02-21 LAB — PROTEIN / CREATININE RATIO, URINE
Creatinine, Urine: 106.1 mg/dL
Protein, Ur: 10.8 mg/dL
Protein/Creat Ratio: 102 mg/g creat (ref 0–200)

## 2020-02-22 LAB — URINE CULTURE, OB REFLEX

## 2020-02-22 LAB — CULTURE, OB URINE

## 2020-02-27 ENCOUNTER — Other Ambulatory Visit: Payer: Self-pay

## 2020-02-27 DIAGNOSIS — O0990 Supervision of high risk pregnancy, unspecified, unspecified trimester: Secondary | ICD-10-CM

## 2020-03-03 IMAGING — US US OB < 14 WEEKS - US OB TV
1 series · 14 of 28 positions shown · non-contrast
Comparison: None.

CLINICAL DATA: Pregnant, beta HCG greater than 8999

EXAM:
OBSTETRIC <14 WK ULTRASOUND
TECHNIQUE: Transabdominal ultrasound was performed for evaluation of the
gestation as well as the maternal uterus and adnexal regions.

[Series 1: us ob < 14 weeks - us ob tv · 75 acquisitions, 14 frames shown]
[im 3/75]
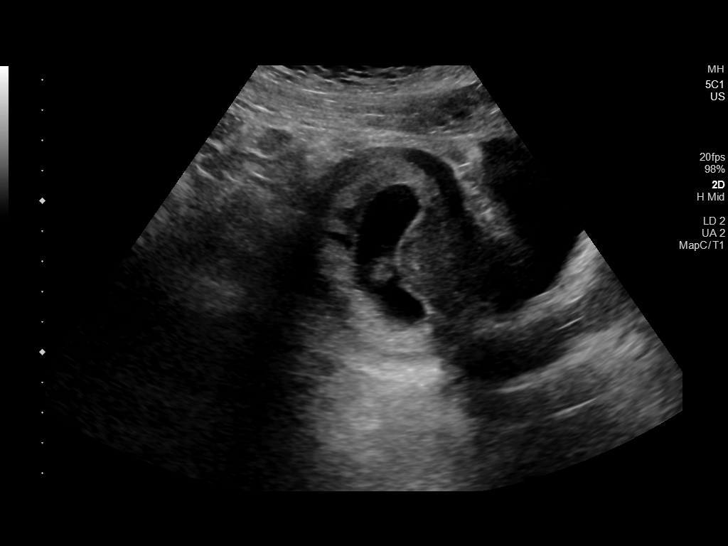
[im 9/75]
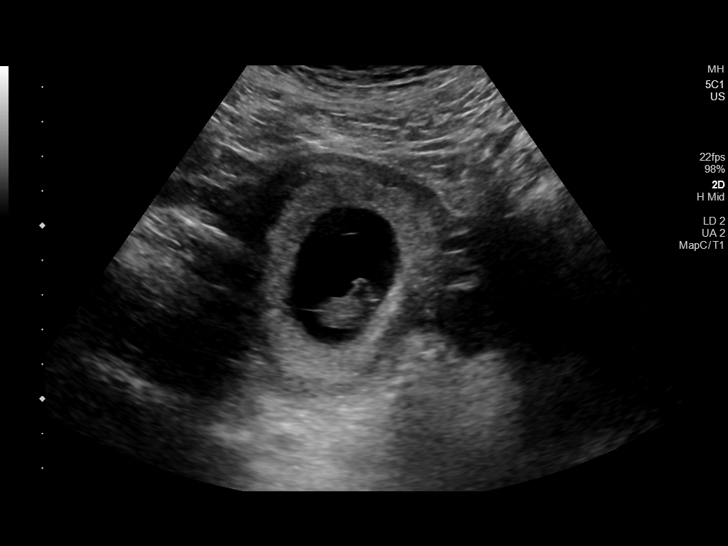
[im 14/75]
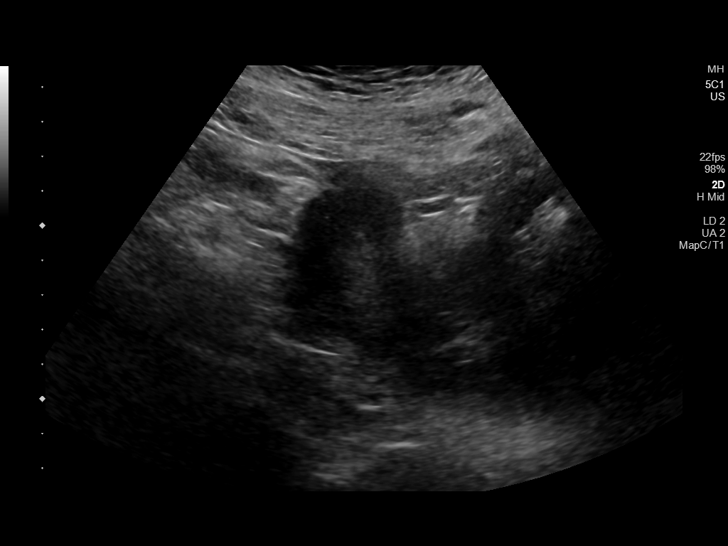
[im 20/75]
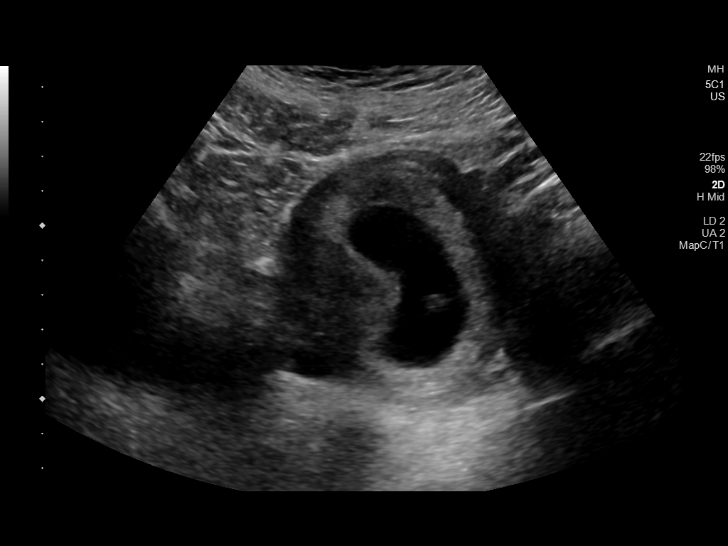
[im 25/75]
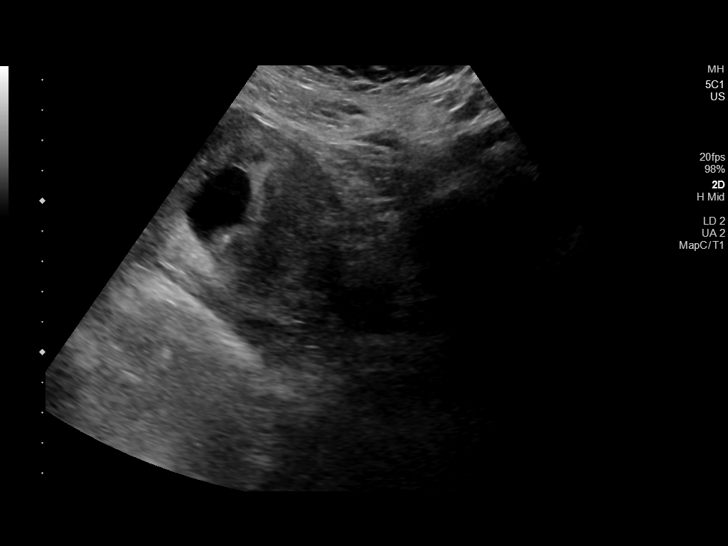
[im 31/75]
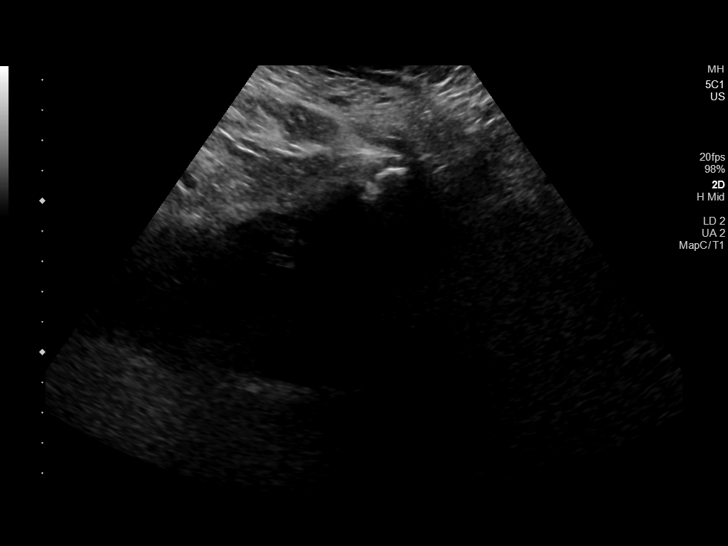
[im 36/75]
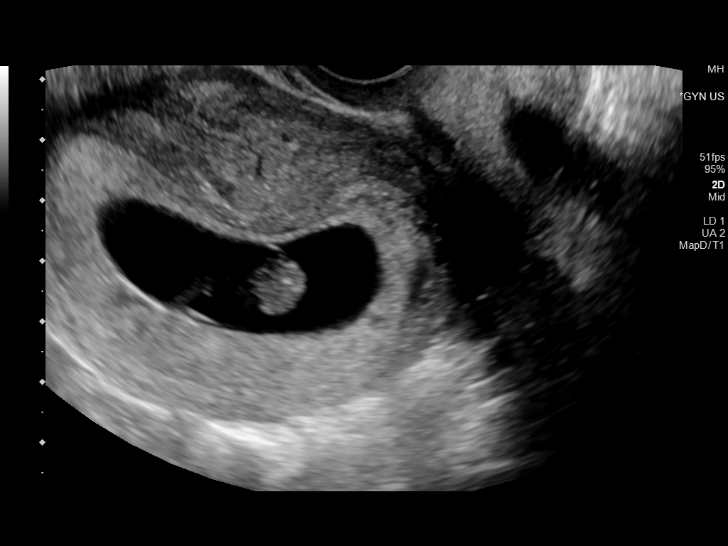
[im 42/75]
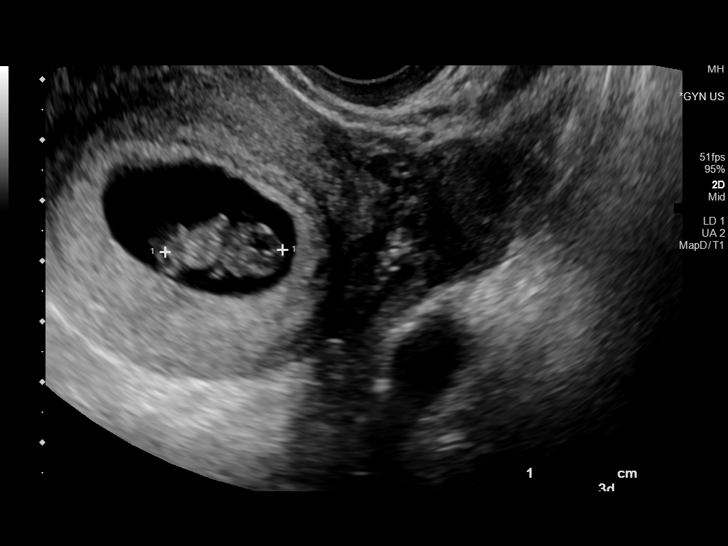
[im 47/75]
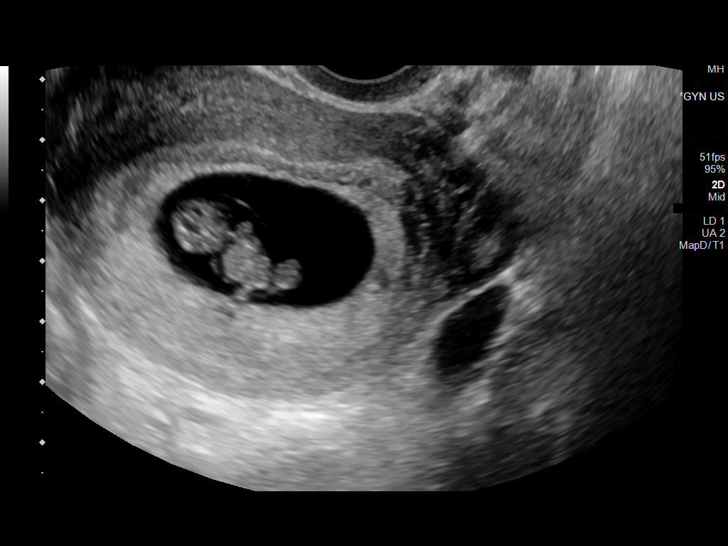
[im 53/75]
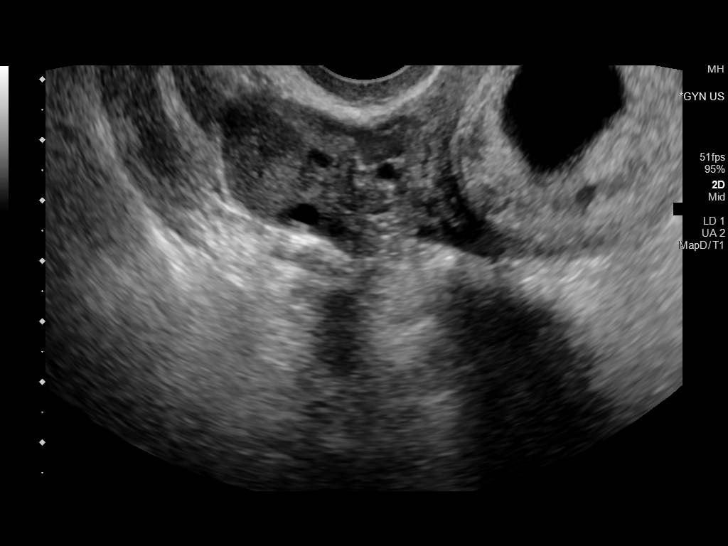
[im 58/75]
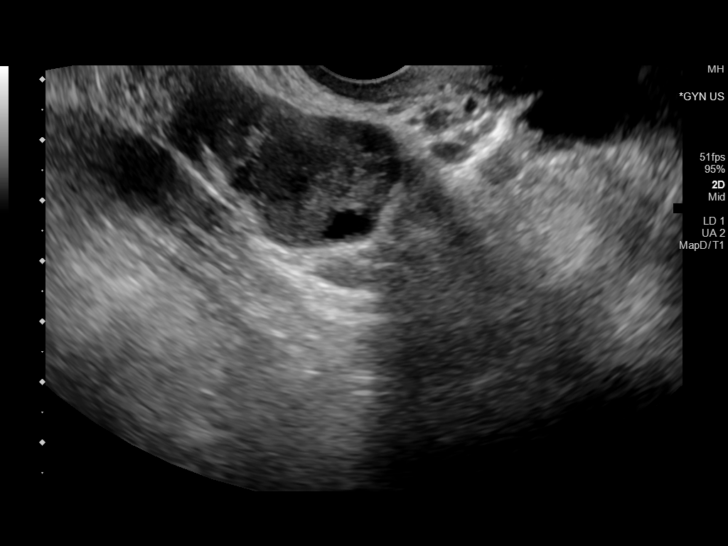
[im 64/75]
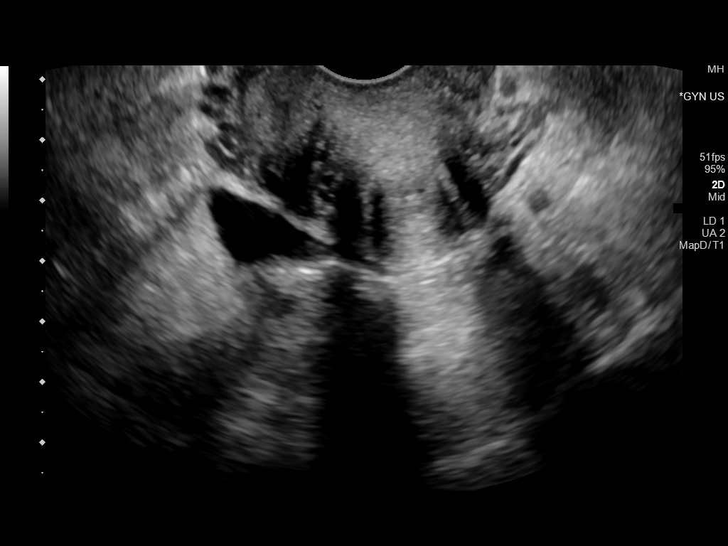
[im 69/75]
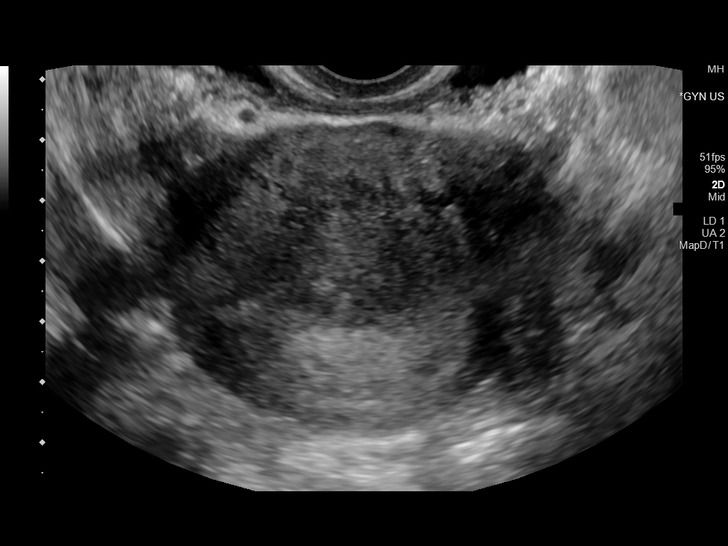
[im 75/75]
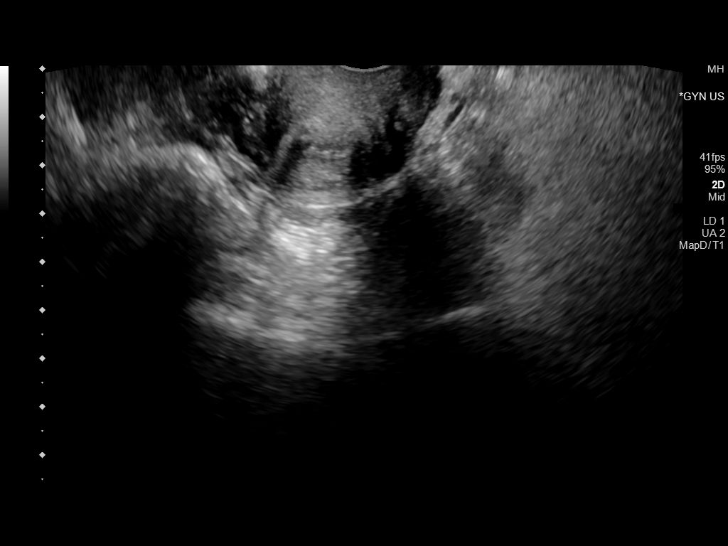

[14 of 28 positions shown; findings below may reference images not displayed]

FINDINGS: Intrauterine gestational sac: Single

Yolk sac:  Visualized.

Embryo:  Visualized.

Cardiac Activity: Visualized.

Heart Rate: 174 bpm

CRL:   19 mm   8 w 3 d                  US EDC: 06/20/2020

Subchorionic hemorrhage: There is a small subchorionic hemorrhage
along the inferior margin of the gestational sac measuring less than
1 cm in greatest dimension.

Maternal uterus/adnexae: Right ovary measures 3.2 x 2.2 x 2.2 cm,
with a volume of 8 cc. Likely corpus luteum cyst measuring 16 mm.
The left ovary is not well seen. Trace pelvic free fluid is likely
physiologic. No pelvic mass.
IMPRESSION: 1. Single live intrauterine pregnancy as above, estimated age 8
weeks and 3 days.
2. Small subchorionic hemorrhage along the inferior margin of the
gestational sac.
3. Nonvisualization of the left ovary.

## 2020-03-04 ENCOUNTER — Encounter: Payer: Self-pay | Admitting: *Deleted

## 2020-03-10 ENCOUNTER — Telehealth: Payer: Self-pay

## 2020-03-10 NOTE — Telephone Encounter (Signed)
Called Pt at both #s on file ending, [phone] & [phone], Pt lives in a halfway house & she's not available , so left ,essage for her to call us back reg doing the 2hr GTT.

## 2020-03-19 ENCOUNTER — Other Ambulatory Visit: Payer: Self-pay | Admitting: *Deleted

## 2020-03-19 ENCOUNTER — Other Ambulatory Visit: Payer: Self-pay

## 2020-03-19 ENCOUNTER — Ambulatory Visit: Payer: Self-pay | Attending: Obstetrics and Gynecology | Admitting: *Deleted

## 2020-03-19 ENCOUNTER — Ambulatory Visit: Payer: Self-pay

## 2020-03-19 ENCOUNTER — Ambulatory Visit (HOSPITAL_BASED_OUTPATIENT_CLINIC_OR_DEPARTMENT_OTHER): Payer: Self-pay

## 2020-03-19 DIAGNOSIS — O99312 Alcohol use complicating pregnancy, second trimester: Secondary | ICD-10-CM

## 2020-03-19 DIAGNOSIS — Z362 Encounter for other antenatal screening follow-up: Secondary | ICD-10-CM

## 2020-03-19 DIAGNOSIS — O99210 Obesity complicating pregnancy, unspecified trimester: Secondary | ICD-10-CM

## 2020-03-19 DIAGNOSIS — F17200 Nicotine dependence, unspecified, uncomplicated: Secondary | ICD-10-CM

## 2020-03-19 DIAGNOSIS — F1490 Cocaine use, unspecified, uncomplicated: Secondary | ICD-10-CM

## 2020-03-19 DIAGNOSIS — Z8659 Personal history of other mental and behavioral disorders: Secondary | ICD-10-CM

## 2020-03-19 DIAGNOSIS — Z658 Other specified problems related to psychosocial circumstances: Secondary | ICD-10-CM

## 2020-03-19 DIAGNOSIS — O99212 Obesity complicating pregnancy, second trimester: Secondary | ICD-10-CM | POA: Insufficient documentation

## 2020-03-19 DIAGNOSIS — Z3A26 26 weeks gestation of pregnancy: Secondary | ICD-10-CM | POA: Insufficient documentation

## 2020-03-19 DIAGNOSIS — O99342 Other mental disorders complicating pregnancy, second trimester: Secondary | ICD-10-CM

## 2020-03-19 DIAGNOSIS — F10920 Alcohol use, unspecified with intoxication, uncomplicated: Secondary | ICD-10-CM

## 2020-03-19 DIAGNOSIS — O321XX0 Maternal care for breech presentation, not applicable or unspecified: Secondary | ICD-10-CM | POA: Insufficient documentation

## 2020-03-19 DIAGNOSIS — F1910 Other psychoactive substance abuse, uncomplicated: Secondary | ICD-10-CM

## 2020-03-19 DIAGNOSIS — F1410 Cocaine abuse, uncomplicated: Secondary | ICD-10-CM | POA: Insufficient documentation

## 2020-03-19 DIAGNOSIS — Z8619 Personal history of other infectious and parasitic diseases: Secondary | ICD-10-CM

## 2020-03-19 DIAGNOSIS — O99322 Drug use complicating pregnancy, second trimester: Secondary | ICD-10-CM

## 2020-03-19 DIAGNOSIS — O99332 Smoking (tobacco) complicating pregnancy, second trimester: Secondary | ICD-10-CM | POA: Insufficient documentation

## 2020-03-19 DIAGNOSIS — E669 Obesity, unspecified: Secondary | ICD-10-CM

## 2020-03-19 DIAGNOSIS — F99 Mental disorder, not otherwise specified: Secondary | ICD-10-CM

## 2020-03-19 DIAGNOSIS — O0990 Supervision of high risk pregnancy, unspecified, unspecified trimester: Secondary | ICD-10-CM

## 2020-03-20 ENCOUNTER — Other Ambulatory Visit: Payer: Self-pay

## 2020-03-20 ENCOUNTER — Encounter: Payer: Self-pay | Admitting: Obstetrics & Gynecology

## 2020-03-26 ENCOUNTER — Encounter: Payer: Self-pay | Admitting: Obstetrics and Gynecology

## 2020-03-26 DIAGNOSIS — D563 Thalassemia minor: Secondary | ICD-10-CM | POA: Insufficient documentation

## 2020-03-27 ENCOUNTER — Telehealth: Payer: Self-pay | Admitting: *Deleted

## 2020-03-27 ENCOUNTER — Ambulatory Visit: Payer: Self-pay

## 2020-03-27 NOTE — Telephone Encounter (Signed)
I have reviewed this chart and agree with the RN/CMA assessment and management.    K. Meryl Davante Gerke, M.D. Attending [facility])   

## 2020-03-27 NOTE — Telephone Encounter (Signed)
-----   Message from Conan Bowens, MD sent at 03/26/2020  9:06 AM EDT ----- Silent alpha thal carrier, needs genetic counseling

## 2020-03-27 NOTE — Telephone Encounter (Signed)
I called number listed for Tobi Bastos to call due to patient living in [facility].  I left a message I was trying to reach Cloria re: non- urgent labs we need to discuss with her and that I would try to send a message. Per chart does not have smart phone. Will send a MyChart message as she may have computer access at [facility]. I also called her " home number" and left a message I am calling to discuss some non urgent labs - please call office and check MyChart. Lahna Nath,RN

## 2020-03-30 NOTE — Telephone Encounter (Signed)
Per chart review, pt has read MyChart message regarding genetic testing results.

## 2020-04-28 ENCOUNTER — Ambulatory Visit: Payer: Self-pay

## 2020-04-28 ENCOUNTER — Ambulatory Visit: Payer: Self-pay | Attending: Obstetrics and Gynecology

## 2020-06-11 IMAGING — US US MFM OB DETAIL+14 WK
1 series · 13 of 28 positions shown · non-contrast
Comparison: none

[Series 1: us mfm ob detail+14 wk · 13 of 131 slices shown]
[im 5/131]
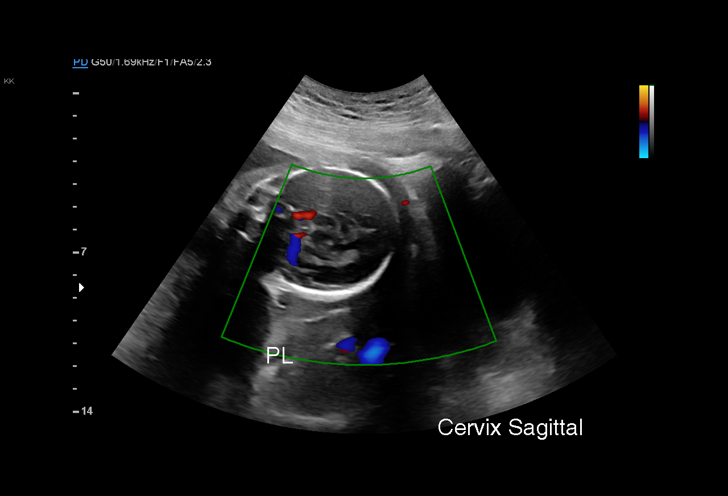
[im 15/131]
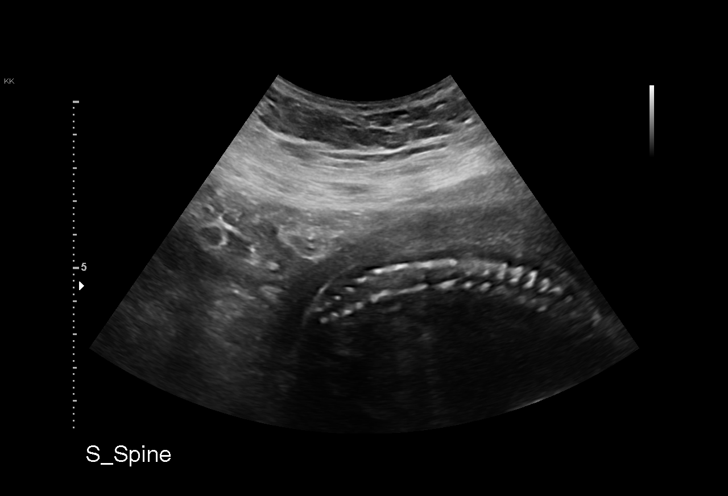
[im 25/131]
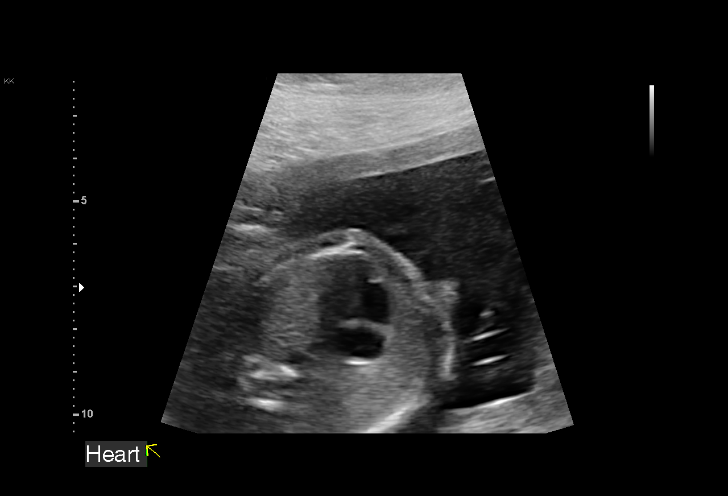
[im 34/131]
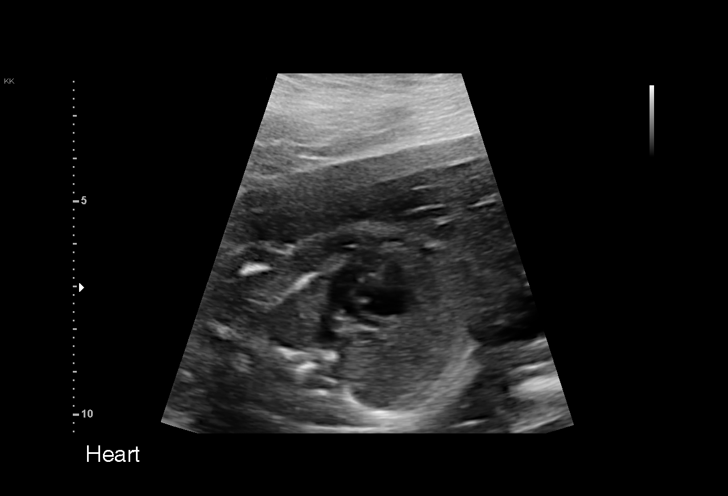
[im 44/131]
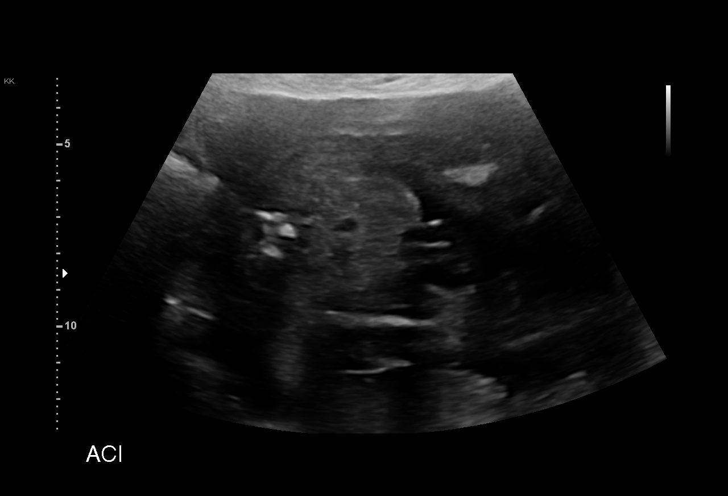
[im 53/131]
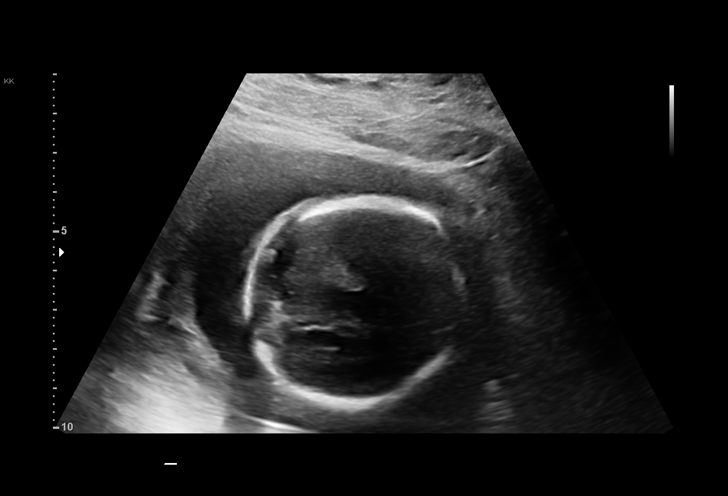
[im 68/131]
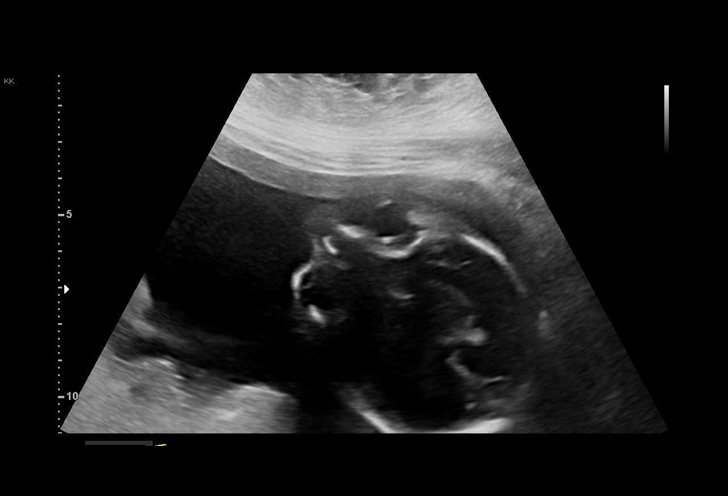
[im 78/131]
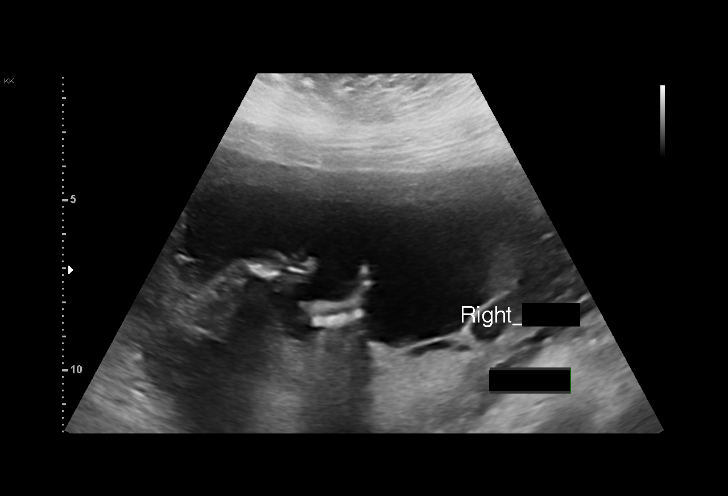
[im 87/131]
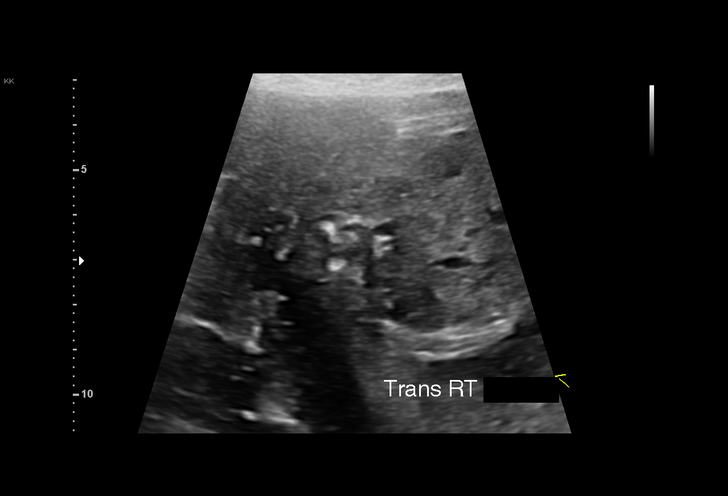
[im 97/131]
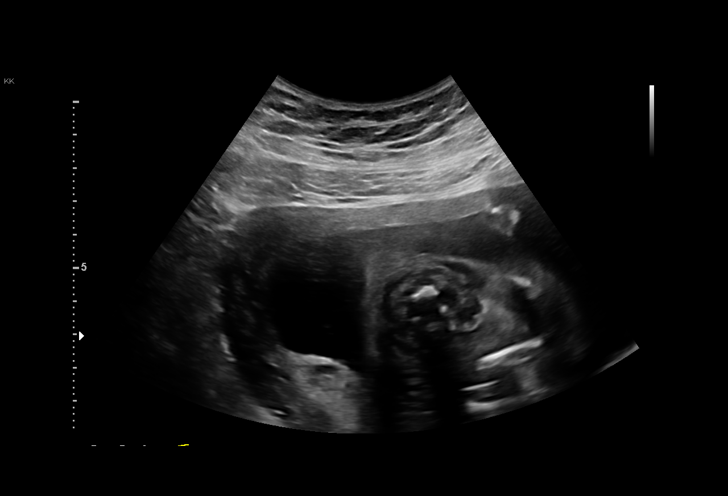
[im 106/131]
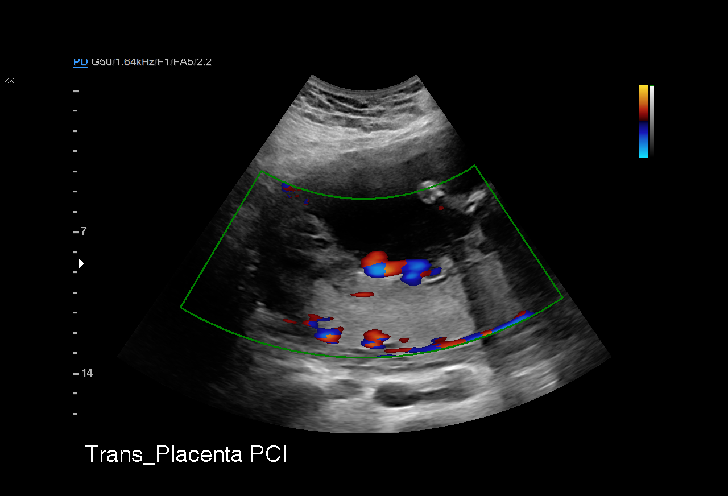
[im 116/131]
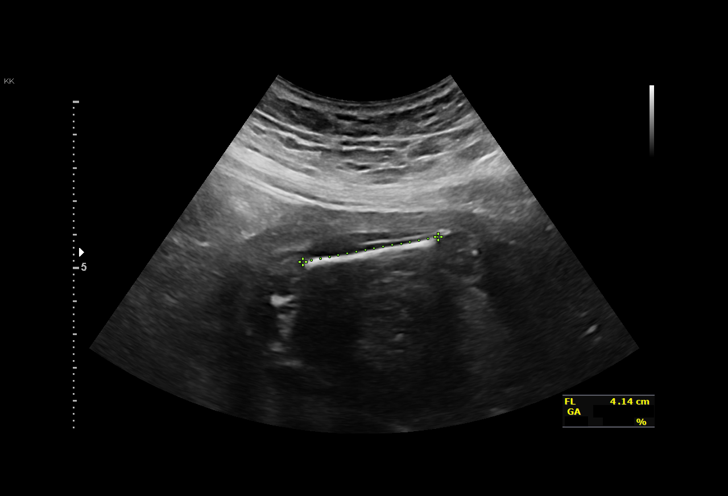
[im 126/131]
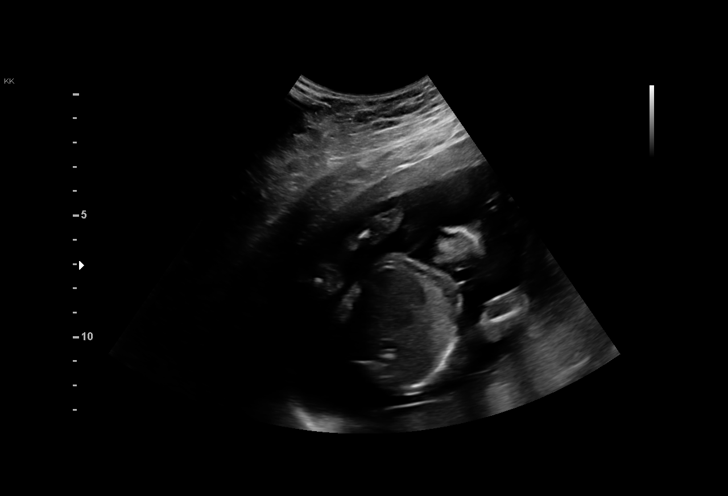

[13 of 28 positions shown; findings below may reference images not displayed]

CRESEAN NP

Indications

 Encounter for antenatal screening for
 malformations
 Tobacco use complicating pregnancy,
 second trimester
 Alcohol use complicating pregnancy, second
 trimester
 Substance abuse affecting pregnancy,
 antepartum
 22 weeks gestation of pregnancy
 Obesity complicating pregnancy, second
 trimester
 Cocaine use
 Obesity complicating pregnancy, second
 trimester
Vital Signs

 BMI:
Fetal Evaluation

 Num Of Fetuses:         1
 Fetal Heart Rate(bpm):  146
 Cardiac Activity:       Observed
 Presentation:           Cephalic
 Placenta:               Posterior
 P. Cord Insertion:      Visualized

 Amniotic Fluid
 AFI FV:      Within normal limits

                             Largest Pocket(cm)

Biometry

 BPD:      56.5  mm     G. Age:  23w 2d         67  %    CI:        75.69   %    70 - 86
                                                         FL/HC:      19.9   %    19.2 -
 HC:      205.9  mm     G. Age:  22w 5d         35  %    HC/AC:      1.08        1.05 -
 AC:      190.1  mm     G. Age:  23w 5d         75  %    FL/BPD:     72.6   %    71 - 87
 FL:         41  mm     G. Age:  23w 2d         59  %    FL/AC:      21.6   %    20 - 24
 HUM:      36.5  mm     G. Age:  22w 5d         44  %

 Est. FW:     596  gm      1 lb 5 oz     79  %
OB History

 Gravidity:    3         Term:   1        Prem:   0        SAB:   0
 TOP:          1       Ectopic:  0        Living: 1
Gestational Age

 U/S Today:     23w 2d                                        EDD:   06/16/20
 Best:          22w 5d     Det. By:  Early Ultrasound         EDD:   06/20/20
                                     (11/12/19)
Anatomy

 Cranium:               Appears normal         Aortic Arch:            Not well visualized
 Cavum:                 Appears normal         Ductal Arch:            Not well visualized
 Ventricles:            Appears normal         Diaphragm:              Appears normal
 Choroid Plexus:        Appears normal         Stomach:                Appears normal, left
                                                                       sided
 Cerebellum:            Appears normal         Abdomen:                Appears normal
 Posterior Fossa:       Appears normal         Abdominal Wall:         Appears nml (cord
                                                                       insert, abd wall)
 Nuchal Fold:           Not applicable (>20    Cord Vessels:           Appears normal (3
                        wks GA)                                        vessel cord)
 Face:                  Appears normal         Kidneys:                Appear normal
                        (orbits and profile)
 Lips:                  Appears normal         Bladder:                Appears normal
 Thoracic:              Appears normal         Spine:                  Limited views
                                                                       appear normal
 Heart:                 Appears normal         Upper Extremities:      Appears normal
                        (4CH, axis, and
                        situs)
 RVOT:                  Appears normal         Lower Extremities:      Appears normal
 LVOT:                  Appears normal

 Other:  Parents do not wish to know sex of fetus. Heels visualized.
         Technically difficult due to maternal habitus and fetal position.
Cervix Uterus Adnexa

 Cervix
 Length:            4.4  cm.
 Normal appearance by transabdominal scan.
Impression

 History of polysubstance use. Obstetric history is significant
 for a term vaginal delivery in 3232 of a male infant weighing
 2,495 Kg (5-8) at birth.
 Prenatal screening results are pending.

 We performed fetal anatomy scan. No makers of
 aneuploidies or fetal structural defects are seen. Fetal
 biometry is consistent with her previously-established dates.
 Amniotic fluid is normal and good fetal activity is seen.
 Patient understands the limitations of ultrasound in detecting
 fetal anomalies.
 Maternal obesity imposes limitations on the resolution of
 images, and failure to detect fetal anomalies is more common
 in obese pregnant women. As maternal obesity makes
 clinical assessment of fetal growth difficult, we recommend
 serial growth scans until delivery.
Recommendations

 -An appointment was made for her to return in 4 weeks for
 completion of fetal anatomy.
                 Nellore, Osamu

## 2020-06-16 ENCOUNTER — Encounter (HOSPITAL_COMMUNITY): Payer: Self-pay | Admitting: Obstetrics and Gynecology

## 2020-06-16 ENCOUNTER — Inpatient Hospital Stay (HOSPITAL_COMMUNITY)
Admission: AD | Admit: 2020-06-16 | Discharge: 2020-06-21 | DRG: 786 | Disposition: A | Discharge status: Home / Self Care | Payer: Medicaid Other | Attending: Obstetrics and Gynecology | Admitting: Obstetrics and Gynecology

## 2020-06-16 ENCOUNTER — Encounter: Payer: Self-pay | Admitting: *Deleted

## 2020-06-16 DIAGNOSIS — D563 Thalassemia minor: Secondary | ICD-10-CM | POA: Diagnosis present

## 2020-06-16 DIAGNOSIS — O99214 Obesity complicating childbirth: Secondary | ICD-10-CM | POA: Diagnosis present

## 2020-06-16 DIAGNOSIS — F17200 Nicotine dependence, unspecified, uncomplicated: Secondary | ICD-10-CM | POA: Diagnosis present

## 2020-06-16 DIAGNOSIS — Z8659 Personal history of other mental and behavioral disorders: Secondary | ICD-10-CM

## 2020-06-16 DIAGNOSIS — F314 Bipolar disorder, current episode depressed, severe, without psychotic features: Secondary | ICD-10-CM | POA: Diagnosis present

## 2020-06-16 DIAGNOSIS — A549 Gonococcal infection, unspecified: Secondary | ICD-10-CM | POA: Diagnosis present

## 2020-06-16 DIAGNOSIS — O99210 Obesity complicating pregnancy, unspecified trimester: Secondary | ICD-10-CM | POA: Diagnosis present

## 2020-06-16 DIAGNOSIS — O98513 Other viral diseases complicating pregnancy, third trimester: Secondary | ICD-10-CM

## 2020-06-16 DIAGNOSIS — B009 Herpesviral infection, unspecified: Secondary | ICD-10-CM

## 2020-06-16 DIAGNOSIS — O99344 Other mental disorders complicating childbirth: Secondary | ICD-10-CM | POA: Diagnosis present

## 2020-06-16 DIAGNOSIS — O0990 Supervision of high risk pregnancy, unspecified, unspecified trimester: Secondary | ICD-10-CM

## 2020-06-16 DIAGNOSIS — O133 Gestational [pregnancy-induced] hypertension without significant proteinuria, third trimester: Secondary | ICD-10-CM

## 2020-06-16 DIAGNOSIS — O99314 Alcohol use complicating childbirth: Secondary | ICD-10-CM | POA: Diagnosis present

## 2020-06-16 DIAGNOSIS — A568 Sexually transmitted chlamydial infection of other sites: Secondary | ICD-10-CM | POA: Diagnosis present

## 2020-06-16 DIAGNOSIS — F3130 Bipolar disorder, current episode depressed, mild or moderate severity, unspecified: Secondary | ICD-10-CM | POA: Diagnosis present

## 2020-06-16 DIAGNOSIS — O0933 Supervision of pregnancy with insufficient antenatal care, third trimester: Secondary | ICD-10-CM

## 2020-06-16 DIAGNOSIS — O9822 Gonorrhea complicating childbirth: Secondary | ICD-10-CM | POA: Diagnosis present

## 2020-06-16 DIAGNOSIS — F1410 Cocaine abuse, uncomplicated: Secondary | ICD-10-CM | POA: Diagnosis present

## 2020-06-16 DIAGNOSIS — Z3A39 39 weeks gestation of pregnancy: Secondary | ICD-10-CM

## 2020-06-16 DIAGNOSIS — F1910 Other psychoactive substance abuse, uncomplicated: Secondary | ICD-10-CM | POA: Diagnosis present

## 2020-06-16 DIAGNOSIS — O4290 Premature rupture of membranes, unspecified as to length of time between rupture and onset of labor, unspecified weeks of gestation: Secondary | ICD-10-CM | POA: Diagnosis present

## 2020-06-16 DIAGNOSIS — F1994 Other psychoactive substance use, unspecified with psychoactive substance-induced mood disorder: Secondary | ICD-10-CM | POA: Diagnosis present

## 2020-06-16 DIAGNOSIS — F339 Major depressive disorder, recurrent, unspecified: Secondary | ICD-10-CM | POA: Diagnosis present

## 2020-06-16 DIAGNOSIS — F250 Schizoaffective disorder, bipolar type: Secondary | ICD-10-CM | POA: Diagnosis present

## 2020-06-16 DIAGNOSIS — O99334 Smoking (tobacco) complicating childbirth: Secondary | ICD-10-CM | POA: Diagnosis present

## 2020-06-16 DIAGNOSIS — F1414 Cocaine abuse with cocaine-induced mood disorder: Secondary | ICD-10-CM | POA: Diagnosis present

## 2020-06-16 DIAGNOSIS — O99824 Streptococcus B carrier state complicating childbirth: Secondary | ICD-10-CM | POA: Diagnosis present

## 2020-06-16 DIAGNOSIS — Z20822 Contact with and (suspected) exposure to covid-19: Secondary | ICD-10-CM | POA: Diagnosis present

## 2020-06-16 DIAGNOSIS — O99324 Drug use complicating childbirth: Secondary | ICD-10-CM | POA: Diagnosis present

## 2020-06-16 DIAGNOSIS — O99343 Other mental disorders complicating pregnancy, third trimester: Secondary | ICD-10-CM

## 2020-06-16 DIAGNOSIS — F1490 Cocaine use, unspecified, uncomplicated: Secondary | ICD-10-CM | POA: Diagnosis present

## 2020-06-16 DIAGNOSIS — F332 Major depressive disorder, recurrent severe without psychotic features: Secondary | ICD-10-CM | POA: Diagnosis present

## 2020-06-16 DIAGNOSIS — O9832 Other infections with a predominantly sexual mode of transmission complicating childbirth: Secondary | ICD-10-CM | POA: Diagnosis present

## 2020-06-16 DIAGNOSIS — O4292 Full-term premature rupture of membranes, unspecified as to length of time between rupture and onset of labor: Principal | ICD-10-CM | POA: Diagnosis present

## 2020-06-16 DIAGNOSIS — O4593 Premature separation of placenta, unspecified, third trimester: Secondary | ICD-10-CM | POA: Diagnosis present

## 2020-06-16 DIAGNOSIS — Z8619 Personal history of other infectious and parasitic diseases: Secondary | ICD-10-CM | POA: Diagnosis present

## 2020-06-16 DIAGNOSIS — O99323 Drug use complicating pregnancy, third trimester: Secondary | ICD-10-CM

## 2020-06-16 DIAGNOSIS — O4212 Full-term premature rupture of membranes, onset of labor more than 24 hours following rupture: Secondary | ICD-10-CM

## 2020-06-16 DIAGNOSIS — A749 Chlamydial infection, unspecified: Secondary | ICD-10-CM | POA: Diagnosis not present

## 2020-06-16 DIAGNOSIS — O26893 Other specified pregnancy related conditions, third trimester: Secondary | ICD-10-CM | POA: Diagnosis present

## 2020-06-16 DIAGNOSIS — Z658 Other specified problems related to psychosocial circumstances: Secondary | ICD-10-CM

## 2020-06-16 DIAGNOSIS — F1010 Alcohol abuse, uncomplicated: Secondary | ICD-10-CM

## 2020-06-16 DIAGNOSIS — Z30430 Encounter for insertion of intrauterine contraceptive device: Secondary | ICD-10-CM | POA: Diagnosis not present

## 2020-06-16 DIAGNOSIS — Z8759 Personal history of other complications of pregnancy, childbirth and the puerperium: Secondary | ICD-10-CM

## 2020-06-16 DIAGNOSIS — F329 Major depressive disorder, single episode, unspecified: Secondary | ICD-10-CM

## 2020-06-16 DIAGNOSIS — F1020 Alcohol dependence, uncomplicated: Secondary | ICD-10-CM | POA: Diagnosis present

## 2020-06-16 LAB — COMPREHENSIVE METABOLIC PANEL
ALT: 11 U/L (ref 0–44)
AST: 17 U/L (ref 15–41)
Albumin: 2.6 g/dL — ABNORMAL LOW (ref 3.5–5.0)
Alkaline Phosphatase: 167 U/L — ABNORMAL HIGH (ref 38–126)
Anion gap: 9 (ref 5–15)
BUN: <5 mg/dL — ABNORMAL LOW (ref 6–20)
CO2: 21 mmol/L — ABNORMAL LOW (ref 22–32)
Calcium: 8.8 mg/dL — ABNORMAL LOW (ref 8.9–10.3)
Chloride: 104 mmol/L (ref 98–111)
Creatinine, Ser: 0.66 mg/dL (ref 0.44–1.00)
GFR calc Af Amer: >60 mL/min (ref >60)
GFR calc non Af Amer: >60 mL/min (ref >60)
Glucose, Bld: 84 mg/dL (ref 70–99)
Potassium: 3.9 mmol/L (ref 3.5–5.1)
Sodium: 134 mmol/L — ABNORMAL LOW (ref 135–145)
Total Bilirubin: 0.3 mg/dL (ref 0.3–1.2)
Total Protein: 6.2 g/dL — ABNORMAL LOW (ref 6.5–8.1)

## 2020-06-16 LAB — GC/CHLAMYDIA PROBE AMP (~~LOC~~) NOT AT ARMC
Chlamydia: POSITIVE — AB
Comment: NEGATIVE
Comment: NORMAL
Neisseria Gonorrhea: POSITIVE — AB

## 2020-06-16 LAB — WET PREP, GENITAL
Clue Cells Wet Prep HPF POC: NONE SEEN
Sperm: NONE SEEN
Trich, Wet Prep: NONE SEEN
Yeast Wet Prep HPF POC: NONE SEEN

## 2020-06-16 LAB — CBC
HCT: 34.4 % — ABNORMAL LOW (ref 36.0–46.0)
Hemoglobin: 10.6 g/dL — ABNORMAL LOW (ref 12.0–15.0)
MCH: 26.6 pg (ref 26.0–34.0)
MCHC: 30.8 g/dL (ref 30.0–36.0)
MCV: 86.4 fL (ref 80.0–100.0)
Platelets: 307 10*3/uL (ref 150–400)
RBC: 3.98 MIL/uL (ref 3.87–5.11)
RDW: 13.9 % (ref 11.5–15.5)
WBC: 10.1 10*3/uL (ref 4.0–10.5)
nRBC: 0 % (ref 0.0–0.2)

## 2020-06-16 LAB — RAPID URINE DRUG SCREEN, HOSP PERFORMED
Amphetamines: NOT DETECTED
Barbiturates: NOT DETECTED
Benzodiazepines: NOT DETECTED
Cocaine: POSITIVE — AB
Opiates: NOT DETECTED
Tetrahydrocannabinol: NOT DETECTED

## 2020-06-16 LAB — SARS CORONAVIRUS 2 BY RT PCR (HOSPITAL ORDER, PERFORMED IN ~~LOC~~ HOSPITAL LAB): SARS Coronavirus 2: NEGATIVE

## 2020-06-16 LAB — PROTEIN / CREATININE RATIO, URINE
Creatinine, Urine: 62.68 mg/dL
Protein Creatinine Ratio: 0.18 mg/mg{Cre} — ABNORMAL HIGH (ref 0.00–0.15)
Total Protein, Urine: 11 mg/dL

## 2020-06-16 LAB — GROUP B STREP BY PCR: Group B strep by PCR: POSITIVE — AB

## 2020-06-16 LAB — RPR: RPR Ser Ql: NONREACTIVE

## 2020-06-16 LAB — HEMOGLOBIN A1C
Hgb A1c MFr Bld: 6.1 % — ABNORMAL HIGH (ref 4.8–5.6)
Mean Plasma Glucose: 128.37 mg/dL

## 2020-06-16 MED ORDER — MAGNESIUM SULFATE BOLUS VIA INFUSION
4.0000 g | Freq: Once | INTRAVENOUS | Status: AC
  Administered 2020-06-16: 4 g via INTRAVENOUS
  Filled 2020-06-16: qty 1000

## 2020-06-16 MED ORDER — LABETALOL HCL 5 MG/ML IV SOLN: 20.0000 mg | Freq: Once | INTRAVENOUS | Status: DC

## 2020-06-16 MED ORDER — CEFTRIAXONE SODIUM 500 MG IJ SOLR
500.0000 mg | Freq: Once | INTRAMUSCULAR | Status: DC
  Filled 2020-06-16: qty 500

## 2020-06-16 MED ORDER — OXYTOCIN-SODIUM CHLORIDE 30-0.9 UT/500ML-% IV SOLN
2.5000 [IU]/h | INTRAVENOUS | Status: DC
  Administered 2020-06-18: 30 [IU] via INTRAVENOUS
  Filled 2020-06-16: qty 500

## 2020-06-16 MED ORDER — LIDOCAINE HCL (PF) 1 % IJ SOLN
30.0000 mL | INTRAMUSCULAR | Status: DC | PRN
  Filled 2020-06-16: qty 30

## 2020-06-16 MED ORDER — LACTATED RINGERS IV SOLN: INTRAVENOUS | Status: DC

## 2020-06-16 MED ORDER — ONDANSETRON HCL 4 MG/2ML IJ SOLN: 4.0000 mg | Freq: Four times a day (QID) | INTRAMUSCULAR | Status: DC | PRN

## 2020-06-16 MED ORDER — LACTATED RINGERS IV SOLN
500.0000 mL | INTRAVENOUS | Status: DC | PRN
  Administered 2020-06-17 – 2020-06-18 (×3): 500 mL via INTRAVENOUS

## 2020-06-16 MED ORDER — ACETAMINOPHEN 325 MG PO TABS: 650.0000 mg | ORAL_TABLET | ORAL | Status: DC | PRN

## 2020-06-16 MED ORDER — LABETALOL HCL 5 MG/ML IV SOLN: 40.0000 mg | INTRAVENOUS | Status: DC | PRN

## 2020-06-16 MED ORDER — LABETALOL HCL 5 MG/ML IV SOLN: 80.0000 mg | INTRAVENOUS | Status: DC | PRN

## 2020-06-16 MED ORDER — LABETALOL HCL 5 MG/ML IV SOLN: 20.0000 mg | INTRAVENOUS | Status: DC | PRN

## 2020-06-16 MED ORDER — MISOPROSTOL 50MCG HALF TABLET
ORAL_TABLET | ORAL | Status: AC
  Administered 2020-06-16: 50 ug via BUCCAL
  Filled 2020-06-16: qty 1

## 2020-06-16 MED ORDER — FENTANYL CITRATE (PF) 100 MCG/2ML IJ SOLN
50.0000 ug | INTRAMUSCULAR | Status: DC | PRN
  Administered 2020-06-16 – 2020-06-17 (×5): 100 ug via INTRAVENOUS
  Filled 2020-06-16 (×5): qty 2

## 2020-06-16 MED ORDER — HYDRALAZINE HCL 20 MG/ML IJ SOLN: 10.0000 mg | INTRAMUSCULAR | Status: DC | PRN

## 2020-06-16 MED ORDER — OXYTOCIN BOLUS FROM INFUSION: 333.0000 mL | Freq: Once | INTRAVENOUS | Status: DC

## 2020-06-16 MED ORDER — LIDOCAINE HCL (PF) 1 % IJ SOLN: 1.0000 mL | Freq: Once | INTRAMUSCULAR | Status: DC

## 2020-06-16 MED ORDER — OXYCODONE-ACETAMINOPHEN 5-325 MG PO TABS: 1.0000 | ORAL_TABLET | ORAL | Status: DC | PRN

## 2020-06-16 MED ORDER — MISOPROSTOL 50MCG HALF TABLET
50.0000 ug | ORAL_TABLET | ORAL | Status: DC
  Administered 2020-06-16 – 2020-06-17 (×5): 50 ug via BUCCAL
  Filled 2020-06-16 (×4): qty 1

## 2020-06-16 MED ORDER — SOD CITRATE-CITRIC ACID 500-334 MG/5ML PO SOLN
30.0000 mL | ORAL | Status: DC | PRN
  Administered 2020-06-18: 30 mL via ORAL
  Filled 2020-06-16 (×2): qty 30

## 2020-06-16 MED ORDER — MAGNESIUM SULFATE 40 GM/1000ML IV SOLN
2.0000 g/h | INTRAVENOUS | Status: DC
  Administered 2020-06-16 (×2): 2 g/h via INTRAVENOUS
  Filled 2020-06-16 (×2): qty 1000

## 2020-06-16 MED ORDER — LABETALOL HCL 5 MG/ML IV SOLN
INTRAVENOUS | Status: AC
  Administered 2020-06-16: 20 mg
  Filled 2020-06-16: qty 4

## 2020-06-16 MED ORDER — FENTANYL CITRATE (PF) 100 MCG/2ML IJ SOLN
INTRAMUSCULAR | Status: AC
Start: 2020-06-16 — End: 2020-06-17
  Filled 2020-06-16: qty 2

## 2020-06-16 MED ORDER — OXYCODONE-ACETAMINOPHEN 5-325 MG PO TABS: 2.0000 | ORAL_TABLET | ORAL | Status: DC | PRN

## 2020-06-16 MED ORDER — SODIUM CHLORIDE 0.9 % IV SOLN
1000.0000 mg | INTRAVENOUS | Status: AC
  Administered 2020-06-17: 1000 mg via INTRAVENOUS
  Filled 2020-06-16: qty 10

## 2020-06-16 MED ORDER — AZITHROMYCIN 500 MG PO TABS
1000.0000 mg | ORAL_TABLET | Freq: Once | ORAL | Status: AC
  Administered 2020-06-16: 1000 mg via ORAL
  Filled 2020-06-16: qty 2

## 2020-06-16 NOTE — MAU Provider Note (Signed)
None      S: Ms. Jordan Walton is a 29 y.o. G3P1011 at [redacted]w[redacted]d  who presents to MAU today via EMS complaining of contractions. Her water broke while being evaluated in the MAU. She has not received any prenatal care since her new ob visit in May. She had a normal anatomy ultrasound. History of drug use; last used cocaine at 6 pm. Reports history of hypertension, no meds. History of HSV; states it has been years since she's had an outbreak & denies any current symptoms. Is not taking valtrex.   O: BP (!) 151/88   Pulse 83   LMP 09/29/2019 (Approximate) Comment: Pt not sure  SpO2 97%  GENERAL: Well-developed, well-nourished female in no acute distress.  HEAD: Normocephalic, atraumatic.  CHEST: Normal effort of breathing, regular heart rate ABDOMEN: Soft, nontender, gravid PELVIC: leaking clear amniotic fluid. ?1 mm lesion on perineum  Cervical exam:  Dilation: 1 Effacement (%): 50 Cervical Position: Posterior Station: Ballotable Presentation: Vertex Exam by:: Holly Flippin RN    Fetal Monitoring: Baseline: 135 Variability: moderate Accelerations: 15x15 Decelerations: none Contractions: Q4-7 minutes  No results found for this or any previous visit (from the past 24 hour(s)).  RN unable to feel presentation. BSUS performed  Pt informed that the ultrasound is considered a limited OB ultrasound and is not intended to be a complete ultrasound exam.  Patient also informed that the ultrasound is not being completed with the intent of assessing for fetal or placental anomalies or any pelvic abnormalities.  Explained that the purpose of today's ultrasound is to assess for  presentation.  Patient acknowledges the purpose of the exam and the limitations of the study.  Cephalic    A/P 1. Full-term premature rupture of membranes, unspecified duration to onset of labor  -admit to birthing suites  2. Limited prenatal care in third trimester  -A1C, GC/CT, GBS PCR collected in MAU  3.  Gestational hypertension, third trimester  -elevated BPs in MAU. Severe range x 1. Preeclampsia labs collected. Per review of chart has hx of GHTN, no evidence of CHTN on chart  4. [redacted] weeks gestation of pregnancy   5. Crack cocaine use  -last used yesterday at 6 pm. UDS pending  6. History of herpes genitalis  -Spec exam performed by Dr. Vergie Living, no lesions     Judeth Horn, NP 06/16/2020 6:02 AM

## 2020-06-16 NOTE — Progress Notes (Signed)
CSW acknowledges consult for pt's substance use during  Pregnancy as well as pt's desire to possible place infant up for adoption. CSW went to follow up with pt at bedside to educate pt on Adoption resources, however pt unable to speak with CSW as pt appeared to be in pain and reporting "can I talk to you later?". CSW understanding and asked pt if it would be okay for CSW to return around 3:30pm in which pt was agreeable. Pt agreeable to this plan. CSW will follow up with pt at 3:30pm.    Claude Manges. Avy Barlett, MSW, LCSW [facility] [phone]

## 2020-06-16 NOTE — Progress Notes (Signed)
LABOR PROGRESS NOTE  Latanya Amstutz is a 29 y.o. G3P1011 at [redacted]w[redacted]d  admitted for PROM.   Subjective: Feeling shaky and bothered by the belly belts.   Objective: BP (!) 145/106   Pulse 91   Temp 98.3 F (36.8 C) (Oral)   Resp 15   Ht 4\' 11"  (1.499 m)   Wt 97.5 kg   LMP 09/29/2019 (Approximate) Comment: Pt not sure  SpO2 100%   BMI 43.42 kg/m  or  Vitals:   06/16/20 1400 06/16/20 1608 06/16/20 1702 06/16/20 1802  BP: 140/90 (!) 122/55 140/84 (!) 145/106  Pulse: 78 82 88 91  Resp: 15 15 15 15   Temp:  98.3 F (36.8 C)    TempSrc:  Oral    SpO2:      Weight:      Height:         Dilation: 1.5 Effacement (%): 30 Cervical Position: Posterior Station: -3 Presentation: Vertex Exam by:: Resident FHT: baseline rate 140, moderate varibility, few acel, -decel Toco: every 10-12 min   Labs: Lab Results  Component Value Date   WBC 10.1 06/16/2020   HGB 10.6 (L) 06/16/2020   HCT 34.4 (L) 06/16/2020   MCV 86.4 06/16/2020   PLT 307 06/16/2020    Patient Active Problem List   Diagnosis Date Noted  . PROM (premature rupture of membranes) 06/16/2020  . Alpha thalassemia silent carrier 03/26/2020  . History of gestational hypertension 02/20/2020  . Supervision of high risk pregnancy, antepartum 02/06/2020  . Crack cocaine use   . Obesity in pregnancy   . Psychosocial stressors   . Major depressive disorder, recurrent episode (HCC) 11/14/2019  . Schizoaffective disorder, bipolar type (HCC) 11/09/2019  . Substance induced mood disorder (HCC) 04/11/2019  . Cocaine abuse (HCC) 04/13/2017  . Cocaine abuse with cocaine-induced mood disorder (HCC) 04/13/2017  . Bipolar I disorder, most recent episode depressed, severe without psychotic features (HCC) 08/01/2016  . Bipolar disorder, current episode depressed, mild or moderate severity, unspecified (HCC) 07/31/2016  . Polysubstance abuse (HCC) 07/31/2016  . History of suicidal ideation 06/20/2016  . Tobacco dependence 05/02/2016   . Major depressive disorder, recurrent severe without psychotic features (HCC) 02/25/2016  . Uncomplicated alcohol dependence (HCC) 02/25/2016  . History of herpes genitalis 03/27/2008    Assessment / Plan: 29 y.o. G3P1011 at [redacted]w[redacted]d here for PROM.   Labor: Some progression since last check, will cont with buccal cytotec (x2 total).  Fetal Wellbeing:  Cat 1  Pain Control:  IV fent PRN, epidural in future  Anticipated MOD:  SVD  #Pre-e w/ severe features:  Also in the setting of recent drug use. SBP ranging 120-140. Pre-e labs wnl.  --Labetalol PRN  --IV Mag   #Cocaine and EtOH use:  --Monitor for s/sx of withdrawal   #Poor social situation:  Mother would like to place infant for adoption, SW attempted to meet x2, however not able to discuss with patient and will meet with her postpartum.   #STDs:  Positive for gc/ch on admit. H/o of HSV not on suppression, however no lesions seen on speculum exam at admit.  --Rocephin and doxycycline pp     26, DO  Family Medicine PGY-3  06/16/2020, 6:13 PM

## 2020-06-16 NOTE — MAU Note (Signed)
L&D Charge Nurse will call MAU when bed is available.

## 2020-06-16 NOTE — Progress Notes (Addendum)
LABOR PROGRESS NOTE  Jordan Walton is a 29 y.o. G3P1011 at [redacted]w[redacted]d  admitted for PROM.   Subjective: Went to check in on patient now that she is more awake and alert.  Doing well, not feeling any contractions or pressure.  Reports she usually drinks about 2 40oz beers on the weekends and occasionally during the week for the past few months.  Requests to place infant for adoption.   Objective: BP 132/73   Pulse 80   Temp 98 F (36.7 C) (Axillary)   Resp 15   Ht 4\' 11"  (1.499 m)   Wt 97.5 kg   LMP 09/29/2019 (Approximate) Comment: Pt not sure  SpO2 100%   BMI 43.42 kg/m  or  Vitals:   06/16/20 1130 06/16/20 1200 06/16/20 1230 06/16/20 1300  BP:  127/63  132/73  Pulse:  82  80  Resp:  15  15  Temp:      TempSrc:      SpO2: 90% 99% 99% 100%  Weight:      Height:        Dilation: 1.5 Effacement (%): Thick Cervical Position: Posterior Station: -3 Presentation: Vertex Exam by:: Resident FHT: baseline rate 130, moderate varibility, +acel, -decel Toco: Irritable   Labs: Lab Results  Component Value Date   WBC 10.1 06/16/2020   HGB 10.6 (L) 06/16/2020   HCT 34.4 (L) 06/16/2020   MCV 86.4 06/16/2020   PLT 307 06/16/2020    Patient Active Problem List   Diagnosis Date Noted  . PROM (premature rupture of membranes) 06/16/2020  . Alpha thalassemia silent carrier 03/26/2020  . History of gestational hypertension 02/20/2020  . Supervision of high risk pregnancy, antepartum 02/06/2020  . Crack cocaine use   . Obesity in pregnancy   . Psychosocial stressors   . Major depressive disorder, recurrent episode (HCC) 11/14/2019  . Schizoaffective disorder, bipolar type (HCC) 11/09/2019  . Substance induced mood disorder (HCC) 04/11/2019  . Cocaine abuse (HCC) 04/13/2017  . Cocaine abuse with cocaine-induced mood disorder (HCC) 04/13/2017  . Bipolar I disorder, most recent episode depressed, severe without psychotic features (HCC) 08/01/2016  . Bipolar disorder, current  episode depressed, mild or moderate severity, unspecified (HCC) 07/31/2016  . Polysubstance abuse (HCC) 07/31/2016  . History of suicidal ideation 06/20/2016  . Tobacco dependence 05/02/2016  . Major depressive disorder, recurrent severe without psychotic features (HCC) 02/25/2016  . Uncomplicated alcohol dependence (HCC) 02/25/2016  . History of herpes genitalis 03/27/2008    Assessment / Plan: 29 y.o. G3P1011 at [redacted]w[redacted]d here for PROM.   Labor: Minimal change since last check in the MAU. Will give buccal cytotec x1. Discussed FB placement, however patient declined at this time.  Fetal Wellbeing:  Cat 1 strip  Pain Control:  IV fent PRN, desires epidural  Anticipated MOD:  SVD  #Pre-e w/ severe features:  May have been 2/2 drug use, however cont with IV mag. BP now wnl. Pre-e labs wnl.  --Labetalol PRN  --IV Mag   #Cocaine and EtOH use:  --Monitor for s/sx of withdrawal   #STDs:  Positive for gc/ch on admit.  --Rocephin and doxycycline pp   Additionally mother requests to place infant for adoption, will consult SW for further discussion.   [redacted]w[redacted]d, DO  Family Medicine PGY-3  06/16/2020, 1:45 PM

## 2020-06-16 NOTE — Progress Notes (Signed)
Patient is unable to answer admission questions at this time. Jordan Walton is patient's father and support person.  [phone]

## 2020-06-16 NOTE — H&P (Addendum)
OBSTETRIC ADMISSION HISTORY AND PHYSICAL  Jordan Walton is a 29 y.o. female G73P1011 with IUP at [redacted]w[redacted]d by 8wk u/s presenting for contractions. SROM in MAU. She reports +FMs, no VB, no blurry vision, headaches or peripheral edema, and RUQ pain.  She plans on formula feeding. She request OCPS vs depo for birth control. She received her prenatal care at [facility]   She reports feeling very tired; reports she has been up for several days now using cocaine and EtOH daily.    Dating: By Brigid Re u/s --->  Estimated Date of Delivery: 06/20/20  Sono:  03/19/20@[redacted]w[redacted]d , CWD, normal anatomy, breech presentation, 969g, 37% EFW  Prenatal History/Complications:  Limited PNC Hx of homelessness, previously living at [facility], has now been at her mother's house for the past 6 months.  Cocaine abuse (last use 9/14 @0200 ), reports relapse over labor day weekend EtOH abuse (last use 9/14 early am) MDD/SI/bipolar (prescribed zoloft and hydroxyzine-reports intermittent use) HSV (not on meds), however reported last breakout 11 years ago on initial prenatal  Hx of gHTN (g1) Alpha thal silent carrier  Past Medical History: Past Medical History:  Diagnosis Date   Crack cocaine use    Herpes simplex virus (HSV) infection    Pregnancy induced hypertension     Past Surgical History: History reviewed. No pertinent surgical history.  Obstetrical History: OB History     Gravida  3   Para  1   Term  1   Preterm      AB  1   Living  1      SAB      TAB  1   Ectopic      Multiple      Live Births  1           Social History Social History   Socioeconomic History   Marital status: Single    Spouse name: Not on file   Number of children: Not on file   Years of education: Not on file   Highest education level: Not on file  Occupational History   Occupation: Unemployed  Tobacco Use   Smoking status: Former Smoker    Packs/day: 0.25    Types: Cigarettes   Smokeless tobacco: Never Used   10/14 Use: Never used  Substance and Sexual Activity   Alcohol use: Not Currently    Alcohol/week: 6.0 - 12.0 standard drinks    Types: 6 - 12 Cans of beer per week    Comment: last was 11/04/19   Drug use: Yes    Frequency: 7.0 times per week    Types: "Crack" cocaine    Comment: Daily use -- up to a gram, last was 06/16/88   Sexual activity: Yes    Birth control/protection: None  Other Topics Concern   Not on file  Social History Narrative   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   Social Determinants of Health   Financial Resource Strain:    Difficulty of Paying Living Expenses: Not on file  Food Insecurity: Food Insecurity Present   Worried About [place] in the Last Year: Often true   Programme researcher, broadcasting/film/video in the Last Year: Often true  Transportation Needs: Unmet Transportation Needs   Lack of Transportation (Medical): Yes   Lack of Transportation (Non-Medical): Yes  Physical Activity:    Days of Exercise per Week: Not on file   Minutes  of Exercise per Session: Not on file  Stress:    Feeling of Stress : Not on file  Social Connections:    Frequency of Communication with Friends and Family: Not on file   Frequency of Social Gatherings with Friends and Family: Not on file   Attends Religious Services: Not on file   Active Member of Clubs or Organizations: Not on file   Attends Banker Meetings: Not on file   Marital Status: Not on file    Family History: Family History  Problem Relation Age of Onset   Hypertension Father     Allergies: No Known Allergies  Medications Prior to Admission  Medication Sig Dispense Refill Last Dose   aspirin EC 81 MG tablet Take 1 tablet (81 mg total) by mouth daily. Take after 12 weeks for prevention of preeclampsia later in pregnancy (Patient not taking: Reported on 02/20/2020) 300 tablet 2    Cholecalciferol  (VITAMIN D3) 50 MCG (2000 UT) capsule Take 2,000 Units by mouth daily.      diphenhydrAMINE HCl (BENADRYL PO) Take by mouth.      folic acid (FOLVITE) 1 MG tablet Take 1 mg by mouth daily.      hydrOXYzine (ATARAX/VISTARIL) 25 MG tablet Take 1 tablet (25 mg total) by mouth every 6 (six) hours as needed for anxiety. (Patient not taking: Reported on 02/20/2020) 30 tablet 0    Loratadine (CLARITIN PO) Take by mouth.      Prenatal Vit-Fe Fumarate-FA (PRENATAL MULTIVITAMIN) TABS tablet Take 1 tablet by mouth daily. 30 tablet 0    sertraline (ZOLOFT) 25 MG tablet Take 3 tablets (75 mg total) by mouth daily. 90 tablet 0    SV MELATONIN 3 MG TBDP DISSOLVE 1 TABLET BY MOUTH ONCE DAILY AT BEDTIME AS NEEDED FOR SLEEP        Review of Systems   All systems reviewed and negative except as stated in HPI  Blood pressure 131/82, pulse 71, temperature 98 F (36.7 C), temperature source Axillary, resp. rate 13, height 4\' 11"  (1.499 m), weight 97.5 kg, last menstrual period 09/29/2019, SpO2 96 %. General appearance: fatigued, no distress and Quickly falling asleep between sentences however easily arousable for brief periods of alertness Lungs: normal respiratory effort Heart: regular rate and rhythm Abdomen: soft, non-tender; gravid Pelvic: as noted below Extremities: Homans sign is negative, no sign of DVT Presentation: cephalic per MAU provider Fetal monitoringBaseline: 130 bpm, Variability: Good {> 6 bpm), Accelerations: Reactive and Decelerations: Occurs rare, one variable  Uterine activity: irritable  Dilation: 1 Effacement (%): 50 Station: Ballotable Exam by:: [email or web address] RN    Prenatal labs: ABO, Rh: --/--/B POS (09/14 02-25-2005) Antibody: NEG (09/14 02-25-2005) Rubella: 1.94 (05/20 1215) RPR: Non Reactive (05/20 1215)  HBsAg: Negative (05/20 1215)  HIV: Non Reactive (05/20 1215)  GBS:   unknown, pending 1 hr Glucola not performed Genetic screening not done Anatomy 03-29-1985 normal  Prenatal Transfer  Tool  Maternal Diabetes: unknown Genetic Screening: not offered Maternal Ultrasounds/Referrals: Normal Fetal Ultrasounds or other Referrals:  None Maternal Substance Abuse:  Yes:  Type: Cocaine, Other: alcohol Significant Maternal Medications:  None Significant Maternal Lab Results: None  Results for orders placed or performed during the hospital encounter of 06/16/20 (from the past 24 hour(s))  Wet prep, genital   Collection Time: 06/16/20  5:58 AM   Specimen: Vaginal/Rectal  Result Value Ref Range   Yeast Wet Prep HPF POC NONE SEEN NONE SEEN   Trich, Wet Prep NONE  SEEN NONE SEEN   Clue Cells Wet Prep HPF POC NONE SEEN NONE SEEN   WBC, Wet Prep HPF POC MANY (A) NONE SEEN   Sperm NONE SEEN   SARS Coronavirus 2 by RT PCR (hospital order, performed in [facility] lab) Nasopharyngeal Vaginal/Rectal   Collection Time: 06/16/20  5:58 AM   Specimen: Vaginal/Rectal; Nasopharyngeal  Result Value Ref Range   SARS Coronavirus 2 NEGATIVE NEGATIVE  CBC   Collection Time: 06/16/20  5:58 AM  Result Value Ref Range   WBC 10.1 4.0 - 10.5 K/uL   RBC 3.98 3.87 - 5.11 MIL/uL   Hemoglobin 10.6 (L) 12.0 - 15.0 g/dL   HCT 13.034.4 (L) 36 - 46 %   MCV 86.4 80.0 - 100.0 fL   MCH 26.6 26.0 - 34.0 pg   MCHC 30.8 30.0 - 36.0 g/dL   RDW 86.513.9 78.411.5 - 69.615.5 %   Platelets 307 150 - 400 K/uL   nRBC 0.0 0.0 - 0.2 %  Hemoglobin A1c   Collection Time: 06/16/20  5:58 AM  Result Value Ref Range   Hgb A1c MFr Bld 6.1 (H) 4.8 - 5.6 %   Mean Plasma Glucose 128.37 mg/dL  Comprehensive metabolic panel   Collection Time: 06/16/20  5:58 AM  Result Value Ref Range   Sodium 134 (L) 135 - 145 mmol/L   Potassium 3.9 3.5 - 5.1 mmol/L   Chloride 104 98 - 111 mmol/L   CO2 21 (L) 22 - 32 mmol/L   Glucose, Bld 84 70 - 99 mg/dL   BUN <5 (L) 6 - 20 mg/dL   Creatinine, Ser 2.950.66 0.44 - 1.00 mg/dL   Calcium 8.8 (L) 8.9 - 10.3 mg/dL   Total Protein 6.2 (L) 6.5 - 8.1 g/dL   Albumin 2.6 (L) 3.5 - 5.0 g/dL   AST 17 15 -  41 U/L   ALT 11 0 - 44 U/L   Alkaline Phosphatase 167 (H) 38 - 126 U/L   Total Bilirubin 0.3 0.3 - 1.2 mg/dL   GFR calc non Af Amer >60 >60 mL/min   GFR calc Af Amer >60 >60 mL/min   Anion gap 9 5 - 15  Type and screen [facility]   Collection Time: 06/16/20  5:58 AM  Result Value Ref Range   ABO/RH(D) B POS    Antibody Screen NEG    Sample Expiration      06/19/2020,2359 Performed at [facility], [address] [place], [place] [address]   Rapid urine drug screen (hospital performed)   Collection Time: 06/16/20  8:04 AM  Result Value Ref Range   Opiates NONE DETECTED NONE DETECTED   Cocaine POSITIVE (A) NONE DETECTED   Benzodiazepines NONE DETECTED NONE DETECTED   Amphetamines NONE DETECTED NONE DETECTED   Tetrahydrocannabinol NONE DETECTED NONE DETECTED   Barbiturates NONE DETECTED NONE DETECTED  Protein / creatinine ratio, urine   Collection Time: 06/16/20  8:04 AM  Result Value Ref Range   Creatinine, Urine 62.68 mg/dL   Total Protein, Urine 11 mg/dL   Protein Creatinine Ratio 0.18 (H) 0.00 - 0.15 mg/mg[Cre]    Patient Active Problem List   Diagnosis Date Noted   PROM (premature rupture of membranes) 06/16/2020   Alpha thalassemia silent carrier 03/26/2020   History of gestational hypertension 02/20/2020   Supervision of high risk pregnancy, antepartum 02/06/2020   Crack cocaine use    Obesity in pregnancy    Psychosocial stressors    Major  depressive disorder, recurrent episode (HCC) 11/14/2019   Schizoaffective disorder, bipolar type (HCC) 11/09/2019   Substance induced mood disorder (HCC) 04/11/2019   Cocaine abuse (HCC) 04/13/2017   Cocaine abuse with cocaine-induced mood disorder (HCC) 04/13/2017   Bipolar I disorder, most recent episode depressed, severe without psychotic features (HCC) 08/01/2016   Bipolar disorder, current episode depressed, mild or moderate severity, unspecified (HCC) 07/31/2016   Polysubstance abuse (HCC)  07/31/2016   History of suicidal ideation 06/20/2016   Tobacco dependence 05/02/2016   Major depressive disorder, recurrent severe without psychotic features (HCC) 02/25/2016   Uncomplicated alcohol dependence (HCC) 02/25/2016   History of herpes genitalis 03/27/2008    Assessment/Plan:  Chamara Dyck is a 29 y.o. G3P1011 at [redacted]w[redacted]d here for SOL. Pregnancy complicated by cocaine and alcohol use, history of homelessness with limited PNC, MDD/Bipolar disorder with intermittent zoloft/hydroxyzine use.   #Labor: Presents to MAU with contractions, SROM in MAU. Cocaine and EtOH abuse overnight. UDS on admission positive for cocaine only. Will likely place cytotec and FB when patient more alert in the next 30 minutes for discussion.  #Pain: TBD #FWB: Overall cat 1 strip  #ID: GBS pending, will defer treatment at this time given at term #MOF: Formula  #MOC: Undecided between OCPs vs depo  #Circ: yes  #PreE w/ SF: elevated BP in MAU, likely 2/2 substance abuse. PreE labs WNL.  --IV Labetalol PRN  #HSV: Sterile speculum exam unremarkable, not on suppression.  #Polysubstance abuse: UDS + for cocaine only, patient endorses cocaine/etoh abuse overnight.  --SW consult post partum.  #History of MDD/Bipolar:  Patient reports mood as irritable, not currently on prescribed medications. High risk for postpartum depression.  --SW consult  --Considering restarting medications pp    Allayne Stack, DO  06/16/2020, 10:08 AM  Attestation of Supervision of Student:  I confirm that I have verified the information documented in the  resident's  note and that I have also personally reperformed the history, physical exam and all medical decision making activities.  I have verified that all services and findings are accurately documented in this student's note; and I agree with management and plan as outlined in the documentation. I have also made any necessary editorial changes.  Sheila Oats, MD Center  for [facility], [facility] 06/16/2020 5:37 PM

## 2020-06-16 NOTE — MAU Note (Signed)
Pt presents to MAU via EMS with c/o CTX that began around 0100. Pt reports her water broke at 0100 as well. Clear fluids with no odor. Pt reports she used Cocaine again last night around 1800 and her last use was at 0215 this morning. Pt endorses drinking alcohol as well. +FM. No VB.  Judeth Horn, NP, notified of findings.  No prenatal care since first OB visit.

## 2020-06-16 NOTE — Progress Notes (Signed)
CSW followed back up with pt at bedside however, RN notified CSW that pt was asleep. CSW left Adoption [employer] with RN to give to pt if adoption is decided on. CSW will follow up wit pt once delivered.     Claude Manges Aeson Sawyers, MSW, LCSW [facility] [phone]

## 2020-06-16 NOTE — Progress Notes (Signed)
LABOR PROGRESS NOTE  Jordan Walton is a 29 y.o. G3P1011 at [redacted]w[redacted]d  admitted for PROM.   Subjective: Endorses feeling pain and pressure.   Objective: BP 126/68   Pulse 97   Temp 98 F (36.7 C) (Oral)   Resp 19   Ht 4\' 11"  (1.499 m)   Wt 97.5 kg   LMP 09/29/2019 (Approximate) Comment: Pt not sure  SpO2 98%   BMI 43.42 kg/m  or  Vitals:   06/16/20 1900 06/16/20 2000 06/16/20 2100 06/16/20 2200  BP: (!) 111/55 138/76 134/60 126/68  Pulse: 88 85 97 97  Resp: 15 19 16 19   Temp:  98.5 F (36.9 C)  98 F (36.7 C)  TempSrc:  Oral  Oral  SpO2:      Weight:      Height:         Dilation: 1.5 Effacement (%): 30 Cervical Position: Posterior Station: -3 Presentation: Vertex Exam by:: Resident FHT: baseline rate 150, moderate varibility, +acel, -decel Toco: irritable   Labs: Lab Results  Component Value Date   WBC 10.1 06/16/2020   HGB 10.6 (L) 06/16/2020   HCT 34.4 (L) 06/16/2020   MCV 86.4 06/16/2020   PLT 307 06/16/2020    Patient Active Problem List   Diagnosis Date Noted  . PROM (premature rupture of membranes) 06/16/2020  . Alpha thalassemia silent carrier 03/26/2020  . History of gestational hypertension 02/20/2020  . Supervision of high risk pregnancy, antepartum 02/06/2020  . Crack cocaine use   . Obesity in pregnancy   . Psychosocial stressors   . Major depressive disorder, recurrent episode (HCC) 11/14/2019  . Schizoaffective disorder, bipolar type (HCC) 11/09/2019  . Substance induced mood disorder (HCC) 04/11/2019  . Cocaine abuse (HCC) 04/13/2017  . Cocaine abuse with cocaine-induced mood disorder (HCC) 04/13/2017  . Bipolar I disorder, most recent episode depressed, severe without psychotic features (HCC) 08/01/2016  . Bipolar disorder, current episode depressed, mild or moderate severity, unspecified (HCC) 07/31/2016  . Polysubstance abuse (HCC) 07/31/2016  . History of suicidal ideation 06/20/2016  . Tobacco dependence 05/02/2016  . Major  depressive disorder, recurrent severe without psychotic features (HCC) 02/25/2016  . Uncomplicated alcohol dependence (HCC) 02/25/2016  . History of herpes genitalis 03/27/2008    Assessment / Plan: 29 y.o. G3P1011 at [redacted]w[redacted]d here for PROM.   Labor: Cytotec x2. Patient previously declined FB. Will recheck at 2200 and continue augmentation with repeat cytotec or pitocin. Fetal Wellbeing:  Cat 1  Pain Control:  IV fent PRN, epidural in future  Anticipated MOD:  SVD  #Pre-e w/ severe features:  Also in the setting of recent drug use. Asymptomatic. Pre-e labs wnl.  --Labetalol PRN  --IV Mag   #Cocaine and EtOH use:  --Monitor for s/sx of withdrawal   #Poor social situation:  Mother would like to place infant for adoption, SW attempted to meet x2, however not able to discuss with patient and will meet with her postpartum.   #STDs:  Positive for gc/ch on admit. H/o of HSV not on suppression, however no lesions seen on speculum exam at admit.  --Patient amendable to IM rocephin at next check after refusing --Received azithro 100mg    26, MD OB Fellow, Faculty Practice [facility], Center for [facility] 06/16/2020 10:32 PM

## 2020-06-17 ENCOUNTER — Inpatient Hospital Stay (HOSPITAL_COMMUNITY): Payer: Medicaid Other | Admitting: Anesthesiology

## 2020-06-17 LAB — CBC WITH DIFFERENTIAL/PLATELET
Abs Immature Granulocytes: 0.02 10*3/uL (ref 0.00–0.07)
Basophils Absolute: 0 10*3/uL (ref 0.0–0.1)
Basophils Relative: 0 %
Eosinophils Absolute: 0.1 10*3/uL (ref 0.0–0.5)
Eosinophils Relative: 1 %
HCT: 32.4 % — ABNORMAL LOW (ref 36.0–46.0)
Hemoglobin: 10.4 g/dL — ABNORMAL LOW (ref 12.0–15.0)
Immature Granulocytes: 0 %
Lymphocytes Relative: 12 %
Lymphs Abs: 1.4 10*3/uL (ref 0.7–4.0)
MCH: 27.7 pg (ref 26.0–34.0)
MCHC: 32.1 g/dL (ref 30.0–36.0)
MCV: 86.4 fL (ref 80.0–100.0)
Monocytes Absolute: 0.9 10*3/uL (ref 0.1–1.0)
Monocytes Relative: 8 %
Neutro Abs: 9.4 10*3/uL — ABNORMAL HIGH (ref 1.7–7.7)
Neutrophils Relative %: 79 %
Platelets: 297 10*3/uL (ref 150–400)
RBC: 3.75 MIL/uL — ABNORMAL LOW (ref 3.87–5.11)
RDW: 13.9 % (ref 11.5–15.5)
WBC: 11.9 10*3/uL — ABNORMAL HIGH (ref 4.0–10.5)
nRBC: 0 % (ref 0.0–0.2)

## 2020-06-17 LAB — TYPE AND SCREEN
ABO/RH(D): B POS
Antibody Screen: NEGATIVE

## 2020-06-17 MED ORDER — LACTATED RINGERS IV SOLN: 500.0000 mL | Freq: Once | INTRAVENOUS | Status: DC

## 2020-06-17 MED ORDER — PHENYLEPHRINE 40 MCG/ML (10ML) SYRINGE FOR IV PUSH (FOR BLOOD PRESSURE SUPPORT): 80.0000 ug | PREFILLED_SYRINGE | INTRAVENOUS | Status: DC | PRN

## 2020-06-17 MED ORDER — OXYTOCIN-SODIUM CHLORIDE 30-0.9 UT/500ML-% IV SOLN
1.0000 m[IU]/min | INTRAVENOUS | Status: DC
  Administered 2020-06-17 (×2): 2 m[IU]/min via INTRAVENOUS

## 2020-06-17 MED ORDER — BUTORPHANOL TARTRATE 1 MG/ML IJ SOLN
2.0000 mg | Freq: Once | INTRAMUSCULAR | Status: AC
  Administered 2020-06-17: 2 mg via INTRAVENOUS
  Filled 2020-06-17: qty 2

## 2020-06-17 MED ORDER — SODIUM CHLORIDE 0.9 % IV SOLN
5.0000 10*6.[IU] | Freq: Once | INTRAVENOUS | Status: AC
  Administered 2020-06-17: 5 10*6.[IU] via INTRAVENOUS
  Filled 2020-06-17: qty 5

## 2020-06-17 MED ORDER — TERBUTALINE SULFATE 1 MG/ML IJ SOLN
0.2500 mg | Freq: Once | INTRAMUSCULAR | Status: AC | PRN
  Administered 2020-06-18: 0.25 mg via SUBCUTANEOUS
  Filled 2020-06-17 (×2): qty 1

## 2020-06-17 MED ORDER — DIPHENHYDRAMINE HCL 50 MG/ML IJ SOLN: 12.5000 mg | INTRAMUSCULAR | Status: DC | PRN

## 2020-06-17 MED ORDER — LIDOCAINE HCL (PF) 1 % IJ SOLN
INTRAMUSCULAR | Status: DC | PRN
  Administered 2020-06-17 (×2): 4 mL via EPIDURAL

## 2020-06-17 MED ORDER — EPHEDRINE 5 MG/ML INJ: 10.0000 mg | INTRAVENOUS | Status: DC | PRN

## 2020-06-17 MED ORDER — PHENYLEPHRINE 40 MCG/ML (10ML) SYRINGE FOR IV PUSH (FOR BLOOD PRESSURE SUPPORT)
80.0000 ug | PREFILLED_SYRINGE | INTRAVENOUS | Status: DC | PRN
  Filled 2020-06-17: qty 10

## 2020-06-17 MED ORDER — PENICILLIN G POT IN DEXTROSE 60000 UNIT/ML IV SOLN
3.0000 10*6.[IU] | INTRAVENOUS | Status: DC
  Administered 2020-06-17 – 2020-06-18 (×6): 3 10*6.[IU] via INTRAVENOUS
  Filled 2020-06-17 (×6): qty 50

## 2020-06-17 MED ORDER — LEVONORGESTREL 19.5 MCG/DAY IU IUD
INTRAUTERINE_SYSTEM | Freq: Once | INTRAUTERINE | Status: AC
  Administered 2020-06-18: 08:00:00 1 via INTRAUTERINE

## 2020-06-17 MED ORDER — LACTATED RINGERS AMNIOINFUSION: INTRAVENOUS | Status: DC

## 2020-06-17 MED ORDER — BUPIVACAINE HCL (PF) 0.75 % IJ SOLN
INTRAMUSCULAR | Status: DC | PRN
Start: 2020-06-17 — End: 2020-06-18
  Administered 2020-06-17: 12 mL/h via EPIDURAL

## 2020-06-17 MED ORDER — MISOPROSTOL 25 MCG QUARTER TABLET: 25.0000 ug | ORAL_TABLET | ORAL | Status: DC

## 2020-06-17 MED ORDER — FENTANYL-BUPIVACAINE-NACL 0.5-0.125-0.9 MG/250ML-% EP SOLN
12.0000 mL/h | EPIDURAL | Status: DC | PRN
  Administered 2020-06-17: 12 mL/h via EPIDURAL
  Filled 2020-06-17 (×2): qty 250

## 2020-06-17 MED ORDER — MISOPROSTOL 25 MCG QUARTER TABLET
ORAL_TABLET | ORAL | Status: AC
  Filled 2020-06-17: qty 1

## 2020-06-17 NOTE — Progress Notes (Signed)
Labor Progress Note Jordan Walton is a 29 y.o. G3P1011 at [redacted]w[redacted]d presented for contractions and PROM (9/14 @0100 ).   S: Pt reports feeling tired but otherwise no concerns.   O:  BP 136/89    Pulse 95    Temp 98.2 F (36.8 C) (Oral)    Resp 20    Ht 4\' 11"  (1.499 m)    Wt 97.5 kg    LMP 09/29/2019 (Approximate) Comment: Pt not sure   SpO2 97%    BMI 43.42 kg/m  EFM: baseline 120 bpm/mod variability/pos accels/s/p prolonged decels @2300 , now resolved Toco: q2 minutes  CVE: Dilation: 4 Effacement (%): 80 Cervical Position: Middle Station: 0 (capput) Presentation: Vertex Exam by:: Gosswick, MD   A&P: 29 y.o. [redacted]w[redacted]d presented for contractions and PROM (9/14@0100 ).  #Labor: S/p Cytotec x6. FB at 0545, now out. Pitocin started at 1522 but discontinued at 2300 given prolonged decels. Able to restart pitocin at 2315 given improvement in FHT s/p placement of IUPC with initiation of amnioinfusion. Will plan to recheck in 2 hours or sooner as clinically indicated. #Pain: Epidural in place #FWB: Category 2 strip prior given prolonged decels but reassuringly, now Category 1 strip s/p maternal repositioning, IVF bolus, and initiation of amnioinfusion. #GBS positive, PCN  #Elevated BP: Diagnosed on presentation to MAU. Initially called PEC w SF but after further review of case, feel that high BP on admission was secondary to cocaine use. Normal labs and normal urine P:C. Magnesium discontined at 10 AM. Blood pressures most recently normal to mild range. No HA, vision changes or other concerning symptoms.  #Cocaine and EtOH use: monitor for s/sx of withdrawal, last drink 9/13 @1800 .  #Poor social situation:  Unknown FOB, prostitution. Mother considering adoption for infant; SW attempted to meet x2 on 9/14, however not able to discuss with patient and will meet with her postpartum.   #Gonorrhea/chlamydia: diagnosed on admission. S/p rocephin/azithro on 9/13.  #HSV: not on suppression, SSE on  exam unremarkable.   10/13, MD 11:47 PM

## 2020-06-17 NOTE — Anesthesia Procedure Notes (Signed)
Epidural Patient location during procedure: OB Start time: 06/17/2020 4:55 AM End time: 06/17/2020 5:10 AM  Staffing Anesthesiologist: Lewie Loron, MD Performed: anesthesiologist   Preanesthetic Checklist Completed: patient identified, IV checked, risks and benefits discussed, monitors and equipment checked, pre-op evaluation and timeout performed  Epidural Patient position: sitting Prep: DuraPrep and site prepped and draped Patient monitoring: heart rate, continuous pulse ox and blood pressure Approach: midline Location: L3-L4 Injection technique: LOR air and LOR saline  Needle:  Needle type: Tuohy  Needle gauge: 17 G Needle length: 9 cm Needle insertion depth: 7 cm Catheter type: closed end flexible Catheter size: 19 Gauge Catheter at skin depth: 12 cm Test dose: negative  Assessment Sensory level: T8 Events: blood not aspirated, injection not painful, no injection resistance, no paresthesia and negative IV test  Additional Notes Reason for block:procedure for pain

## 2020-06-17 NOTE — Progress Notes (Signed)
Labor Progress Note Jordan Walton is a 29 y.o. G3P1011 at [redacted]w[redacted]d presented for contractions and PROM (9/14 @0100 ).   S: Doing well without complaints. Epidural just placed.  O:  BP 120/81    Pulse (!) 115    Temp 98.1 F (36.7 C) (Oral)    Resp 19    Ht 4\' 11"  (1.499 m)    Wt 97.5 kg    LMP 09/29/2019 (Approximate) Comment: Pt not sure   SpO2 98%    BMI 43.42 kg/m  EFM: baseline 160 bpm/mod variability/no accels/intermittent variable and late decels with quick return to baseline Toco: irritable, difficult to trace  CVE: Dilation: 1.5 Effacement (%): 50 Cervical Position: Posterior Station: -2 Presentation: Vertex Exam by:: [name]   A&P: 29 y.o. 10/01/2019 [redacted]w[redacted]d presented for contractions and PROM (9/14@0100 ). #Labor: S/p Cytotec x5. Patient now comfortable and amendable to FB placement. FB placed without difficulty. Thick meconium fluid with placement. Consider repeat cytotec vs pitocin at next check. #Pain: CSE #FWB: cat 2 #GBS positive, PCN  #Pre-e w/ SF: Diagnosed on presentation to MAU. Also in the setting of recent cocaine use. Asymptomatic. Pre-e labs wnl. Mg. Anti-hypertensive protocol. Received labetalol 20 mg IV x1 in MAU, has not required treatment since. Suspect elevated BP was in the setting of cocaine use, rather than true preE w/ SF.   #Cocaine and EtOH use: monitor for s/sx of withdrawal, last drink 9/13 @1800 .  #Poor social situation:  Unknown FOB, prostitution. Mother would like to place infant for adoption, SW attempted to meet x2 on 9/14, however not able to discuss with patient and will meet with her postpartum.   #Ghonorrhea/chlamydia: diagnosed on admission. Received rocephin/azithro.  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 10/13 unremarkable.    , MD 5:48 AM

## 2020-06-17 NOTE — Progress Notes (Signed)
Labor Progress Note Jordan Walton is a 29 y.o. G3P1011 at [redacted]w[redacted]d presented for contractions and PROM (9/14 @0100 ).   S: Wishes she could eat, feeling very hungry.   O:  BP (!) 146/87   Pulse 97   Temp 98.3 F (36.8 C) (Oral)   Resp 18   Ht 4\' 11"  (1.499 m)   Wt 97.5 kg   LMP 09/29/2019 (Approximate) Comment: Pt not sure  SpO2 95%   BMI 43.42 kg/m  EFM: baseline 145 bpm/mod variability/pos accels/no decels Toco: q4-5  CVE: Dilation: 3 Effacement (%): 50 Cervical Position: Posterior Station: -2 Presentation:  (foley bulb in place) Exam by:: 10/01/2019    A&P: 29 y.o. [name] [redacted]w[redacted]d presented for contractions and PROM (9/14@0100 ). #Labor: S/p Cytotec x6. FB at 0545, still in place, but per RN exam has thinned out, approx 3 cm. Will start pitocin at this time.    #Pain: CSE #FWB: cat 1 #GBS positive, PCN  #Elevated BP: Diagnosed on presentation to MAU. Initially called PEC w SF but after further review of case, feel that high BP on admission was secondary to cocaine use. Normal labs and normal urine P:C. Magnesium discontined at 10 AM.   #Cocaine and EtOH use: monitor for s/sx of withdrawal, last drink 9/13 @1800 .  #Poor social situation:  Unknown FOB, prostitution. Mother would like to place infant for adoption, SW attempted to meet x2 on 9/14, however not able to discuss with patient and will meet with her postpartum.   #Gonorrhea/chlamydia: diagnosed on admission. Received rocephin/azithro.  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    10/13, MD 3:57 PM

## 2020-06-17 NOTE — Progress Notes (Signed)
Labor Progress Note Jordan Walton is a 29 y.o. G3P1011 at [redacted]w[redacted]d presented for PROM (9/14 @0100 ).  S: Doing okay, complaining of pain and pressure. Answers questions appropriately but quickly falls back asleep. Called by RN to bedside to evaluate for cephalic presentation. Previously u/s performed in MAU, but concern on most recent cervical exam.   O:  BP 122/61   Pulse (!) 106   Temp 98.2 F (36.8 C) (Oral)   Resp 20   Ht 4\' 11"  (1.499 m)   Wt 97.5 kg   LMP 09/29/2019 (Approximate) Comment: Pt not sure  SpO2 98%   BMI 43.42 kg/m  EFM: baseline 160 bpm/mod variability/+ accels/intermittent variable/late decels with return to baseline Toco: irritable  CVE: Dilation: 1.5 Effacement (%): 50 Cervical Position: Posterior Station: -3 Presentation: Vertex Exam by:: 10/01/2019, RN   A&P: 29 y.o. Jordan Hawks [redacted]w[redacted]d presented for PROM (9/14@0100 ). #Labor: S/p Cytotec x3. Patient previously declined FB. Will re-dose cytotec at this time given cervical exam. BSUS performed with confirmation of cephalic presentation. #Pain: PRN, desires epidural #FWB: cat 2 #GBS positive, PCN #Pre-e w/ SF: Diagnosed on presentation to MAU. Also in the setting of recent cocaine use. Asymptomatic. Pre-e labs wnl. Mg. Anti-hypertensive protocol.  #Cocaine and EtOH use: monitor for s/sx of withdrawal, last drink 9/13 @1800 . #Poor social situation:  Unknown FOB, prostitution. Mother would like to place infant for adoption, SW attempted to meet x2 on 9/14, however not able to discuss with patient and will meet with her postpartum.  #Ghonorrhea/chlamydia: diagnosed on admission. Received rocephin/azithro. 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 10/13 unremarkable.    , MD 2:01 AM

## 2020-06-17 NOTE — Anesthesia Preprocedure Evaluation (Addendum)
Anesthesia Evaluation  Patient identified by MRN, date of birth, ID band Patient awake    Reviewed: Allergy & Precautions, Patient's Chart, lab work & pertinent test results  Airway Mallampati: II  TM Distance: >3 FB Neck ROM: Full    Dental no notable dental hx.    Pulmonary neg pulmonary ROS, former smoker,  Snores- probable OSA undiagnosed.   Pulmonary exam normal breath sounds clear to auscultation       Cardiovascular hypertension, Normal cardiovascular exam Rhythm:Regular Rate:Normal     Neuro/Psych PSYCHIATRIC DISORDERS Depression Bipolar Disorder Schizophrenia negative neurological ROS     GI/Hepatic negative GI ROS, (+)     substance abuse  cocaine use,   Endo/Other  Morbid obesity  Renal/GU negative Renal ROS     Musculoskeletal negative musculoskeletal ROS (+)   Abdominal (+) + obese,   Peds  Hematology negative hematology ROS (+)   Anesthesia Other Findings   Reproductive/Obstetrics (+) Pregnancy                            Anesthesia Physical Anesthesia Plan  ASA: III and emergent  Anesthesia Plan: Epidural   Post-op Pain Management:    Induction:   PONV Risk Score and Plan:   Airway Management Planned: Natural Airway  Additional Equipment:   Intra-op Plan:   Post-operative Plan:   Informed Consent: I have reviewed the patients History and Physical, chart, labs and discussed the procedure including the risks, benefits and alternatives for the proposed anesthesia with the patient or authorized representative who has indicated his/her understanding and acceptance.     Dental advisory given  Plan Discussed with: CRNA and Anesthesiologist  Anesthesia Plan Comments: (Patient for urgent C/Section for fetal intolerance to labor. Will use Epidural catheter for C/Section. M. Malen Gauze, MD)       Anesthesia Quick Evaluation

## 2020-06-17 NOTE — Progress Notes (Signed)
4:00pm- CSW was notified that pt is now requesting a TSP for infant. CSW advised pt that this is usually done via CPS and that CSW would follow up with CPS to see what pt's options are in regards to a Temporary Safety Provider on tomorrow. Pt reported that she understood. CSW has followed up with CPS to gather more nfaomtion-left vm at this time.   CSW receuvc call from RN on unti expressing to CSW that pt's father had become very angry with pt. CSW went to check in on pt to address further needs.   CSW reported to pt the reason for CSW returning. Pt reported that her father had become angry "about the baby". Pt reported that "he tried to use religion against me". CSW offered pt support and discussed further steps to take in the event that Pt follows through with adoption. CSW advised pt to allow ONLY positive supports for he to be apart of this journey as pt reported that she is already dealing with a lot. Pt expressed to CSW that she is agreeable for her dad to return back to the room once he has arrived back to the hospital. Pt was very tearful asking CSW "should I hold him when he is born?". CSW encouraged pt to do what feels right to her in regards to infant. CSW advised pt that staff as well as CSW is here to support her in this process and honor her wishes in regards to what happens next with infant. Pt thanked CSW and reported that she feels better after speaking with CSW.    Claude Manges Sharmaine Bain, MSW, LCSW [facility] [phone]

## 2020-06-17 NOTE — Progress Notes (Signed)
Labor Progress Note Jordan Walton is a 29 y.o. G3P1011 at [redacted]w[redacted]d presented for PROM (9/14 @0100 ).   S: Doing okay, complaining of pain and pressure. Answers questions appropriately but quickly falls back asleep.  O:  BP 121/67   Pulse (!) 104   Temp 98.6 F (37 C) (Oral)   Resp 18   Ht 4\' 11"  (1.499 m)   Wt 97.5 kg   LMP 09/29/2019 (Approximate) Comment: Pt not sure  SpO2 98%   BMI 43.42 kg/m  EFM: baseline 160 bpm/mod variability/no accels/intermittent variable decels with quick return to baseline, 4 min prolonged decel, resolved with position change Toco: irritable  CVE: Dilation: 1.5 Effacement (%): 50 Cervical Position: Posterior Station: -2 Presentation: Vertex Exam by:: Kayvion Arneson   A&P: 29 y.o. 10/01/2019 [redacted]w[redacted]d presented for PROM (9/14@0100 ). #Labor: S/p Cytotec x4. Patient previously declined FB. Cervical exam unchanged from prior. Will re-dose cytotec at this time given unchanged cervical exam.  #Pain: PRN, desires epidural #FWB: cat 2 #GBS positive, PCN  #Pre-e w/ SF: Diagnosed on presentation to MAU. Also in the setting of recent cocaine use. Asymptomatic. Pre-e labs wnl. Mg. Anti-hypertensive protocol. Received labetalol 20 mg IV x1 in MAU, has not required treatment since.  #Cocaine and EtOH use: monitor for s/sx of withdrawal, last drink 9/13 @1800 .  #Poor social situation:  Unknown FOB, prostitution. Mother would like to place infant for adoption, SW attempted to meet x2 on 9/14, however not able to discuss with patient and will meet with her postpartum.   #Ghonorrhea/chlamydia: diagnosed on admission. Received rocephin/azithro.  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 10/13 unremarkable.    , MD 3:05 AM

## 2020-06-17 NOTE — Progress Notes (Signed)
CSW was able to speak with pt at bedside to confirm adoption plan. Per pt "I just cant afford to take care of a child right now". PT expressed that she is sure about the Adoption and has plans to follow through with this. CSW understanding and provided pt with Adoption Resources. CSW asked that pt reach out to adoption agency of her choice to advise them of her desire to place infant up for adoption. CSW expressed to pt that CSW would follow back up with her after the birth to further assist with adoption needs. Pot expressed understanding and reported to CSW's that she would follow up with  agencies.   CSW was notified that pt has an older daughter that pt reports "she stays with her dad". CSW asked pt how she was feeling/ doing and pt reported "im just here". CSW reported to pt that CSW again would speak with her more in depth after pt has given birth. Pt agreeable to this.    Jordan Walton, MSW, LCSW [facility] [phone]

## 2020-06-18 ENCOUNTER — Encounter (HOSPITAL_COMMUNITY)
Admission: AD | Disposition: A | Discharge status: Home / Self Care | Payer: Self-pay | Source: Home / Self Care | Attending: Obstetrics & Gynecology

## 2020-06-18 ENCOUNTER — Other Ambulatory Visit: Payer: Self-pay

## 2020-06-18 ENCOUNTER — Encounter (HOSPITAL_COMMUNITY): Payer: Self-pay | Admitting: Obstetrics and Gynecology

## 2020-06-18 DIAGNOSIS — A749 Chlamydial infection, unspecified: Secondary | ICD-10-CM

## 2020-06-18 DIAGNOSIS — O9833 Other infections with a predominantly sexual mode of transmission complicating the puerperium: Secondary | ICD-10-CM

## 2020-06-18 DIAGNOSIS — O9822 Gonorrhea complicating childbirth: Secondary | ICD-10-CM

## 2020-06-18 DIAGNOSIS — O9832 Other infections with a predominantly sexual mode of transmission complicating childbirth: Secondary | ICD-10-CM

## 2020-06-18 DIAGNOSIS — O99344 Other mental disorders complicating childbirth: Secondary | ICD-10-CM

## 2020-06-18 DIAGNOSIS — Z30430 Encounter for insertion of intrauterine contraceptive device: Secondary | ICD-10-CM

## 2020-06-18 DIAGNOSIS — O99324 Drug use complicating childbirth: Secondary | ICD-10-CM

## 2020-06-18 LAB — CBC
HCT: 34.2 % — ABNORMAL LOW (ref 36.0–46.0)
Hemoglobin: 10.5 g/dL — ABNORMAL LOW (ref 12.0–15.0)
MCH: 26.4 pg (ref 26.0–34.0)
MCHC: 30.7 g/dL (ref 30.0–36.0)
MCV: 86.1 fL (ref 80.0–100.0)
Platelets: 297 10*3/uL (ref 150–400)
RBC: 3.97 MIL/uL (ref 3.87–5.11)
RDW: 14.1 % (ref 11.5–15.5)
WBC: 16.1 10*3/uL — ABNORMAL HIGH (ref 4.0–10.5)
nRBC: 0 % (ref 0.0–0.2)

## 2020-06-18 LAB — COMPREHENSIVE METABOLIC PANEL
ALT: 11 U/L (ref 0–44)
AST: 17 U/L (ref 15–41)
Albumin: 2.2 g/dL — ABNORMAL LOW (ref 3.5–5.0)
Alkaline Phosphatase: 164 U/L — ABNORMAL HIGH (ref 38–126)
Anion gap: 7 (ref 5–15)
BUN: 5 mg/dL — ABNORMAL LOW (ref 6–20)
CO2: 21 mmol/L — ABNORMAL LOW (ref 22–32)
Calcium: 8.4 mg/dL — ABNORMAL LOW (ref 8.9–10.3)
Chloride: 104 mmol/L (ref 98–111)
Creatinine, Ser: 0.96 mg/dL (ref 0.44–1.00)
GFR calc Af Amer: >60 mL/min (ref >60)
GFR calc non Af Amer: >60 mL/min (ref >60)
Glucose, Bld: 93 mg/dL (ref 70–99)
Potassium: 4.5 mmol/L (ref 3.5–5.1)
Sodium: 132 mmol/L — ABNORMAL LOW (ref 135–145)
Total Bilirubin: 0.2 mg/dL — ABNORMAL LOW (ref 0.3–1.2)
Total Protein: 6 g/dL — ABNORMAL LOW (ref 6.5–8.1)

## 2020-06-18 LAB — TROPONIN I (HIGH SENSITIVITY): Troponin I (High Sensitivity): 20 ng/L — ABNORMAL HIGH (ref <18)

## 2020-06-18 SURGERY — Surgical Case
Anesthesia: Epidural | Wound class: Clean Contaminated

## 2020-06-18 MED ORDER — SENNOSIDES-DOCUSATE SODIUM 8.6-50 MG PO TABS
2.0000 | ORAL_TABLET | ORAL | Status: DC
  Administered 2020-06-18 – 2020-06-21 (×3): 2 via ORAL
  Filled 2020-06-18 (×3): qty 2

## 2020-06-18 MED ORDER — OXYTOCIN-SODIUM CHLORIDE 30-0.9 UT/500ML-% IV SOLN: 2.5000 [IU]/h | INTRAVENOUS | Status: AC

## 2020-06-18 MED ORDER — ENOXAPARIN SODIUM 40 MG/0.4ML ~~LOC~~ SOLN
40.0000 mg | SUBCUTANEOUS | Status: DC
  Filled 2020-06-18 (×2): qty 0.4

## 2020-06-18 MED ORDER — LIDOCAINE-EPINEPHRINE (PF) 2 %-1:200000 IJ SOLN
INTRAMUSCULAR | Status: DC | PRN
  Administered 2020-06-18: 3 mL via EPIDURAL
  Administered 2020-06-18 (×2): 5 mL via EPIDURAL
  Administered 2020-06-18: 2 mL via EPIDURAL

## 2020-06-18 MED ORDER — DEXAMETHASONE SODIUM PHOSPHATE 4 MG/ML IJ SOLN
INTRAMUSCULAR | Status: AC
  Filled 2020-06-18: qty 1

## 2020-06-18 MED ORDER — ONDANSETRON HCL 4 MG/2ML IJ SOLN
INTRAMUSCULAR | Status: DC | PRN
  Administered 2020-06-18: 4 mg via INTRAVENOUS

## 2020-06-18 MED ORDER — KETAMINE HCL 50 MG/5ML IJ SOSY
PREFILLED_SYRINGE | INTRAMUSCULAR | Status: AC
  Filled 2020-06-18: qty 5

## 2020-06-18 MED ORDER — SODIUM CHLORIDE 0.9 % IV SOLN
500.0000 mg | INTRAVENOUS | Status: AC
  Administered 2020-06-18: 500 mg via INTRAVENOUS

## 2020-06-18 MED ORDER — KETOROLAC TROMETHAMINE 30 MG/ML IJ SOLN: 30.0000 mg | Freq: Four times a day (QID) | INTRAMUSCULAR | Status: AC | PRN

## 2020-06-18 MED ORDER — MORPHINE SULFATE (PF) 0.5 MG/ML IJ SOLN
INTRAMUSCULAR | Status: DC | PRN
  Administered 2020-06-18: 3 mg via EPIDURAL

## 2020-06-18 MED ORDER — TETANUS-DIPHTH-ACELL PERTUSSIS 5-2.5-18.5 LF-MCG/0.5 IM SUSP: 0.5000 mL | Freq: Once | INTRAMUSCULAR | Status: DC

## 2020-06-18 MED ORDER — FENTANYL CITRATE (PF) 100 MCG/2ML IJ SOLN: 25.0000 ug | INTRAMUSCULAR | Status: DC | PRN

## 2020-06-18 MED ORDER — NALBUPHINE HCL 10 MG/ML IJ SOLN
5.0000 mg | INTRAMUSCULAR | Status: DC | PRN
  Administered 2020-06-18: 5 mg via INTRAVENOUS
  Filled 2020-06-18: qty 1

## 2020-06-18 MED ORDER — TRANEXAMIC ACID-NACL 1000-0.7 MG/100ML-% IV SOLN
INTRAVENOUS | Status: AC
  Filled 2020-06-18: qty 100

## 2020-06-18 MED ORDER — ACETAMINOPHEN 500 MG PO TABS
1000.0000 mg | ORAL_TABLET | Freq: Three times a day (TID) | ORAL | Status: DC
  Administered 2020-06-18 – 2020-06-21 (×10): 1000 mg via ORAL
  Filled 2020-06-18 (×11): qty 2

## 2020-06-18 MED ORDER — MEPERIDINE HCL 25 MG/ML IJ SOLN: 6.2500 mg | INTRAMUSCULAR | Status: DC | PRN

## 2020-06-18 MED ORDER — NALOXONE HCL 0.4 MG/ML IJ SOLN: 0.4000 mg | INTRAMUSCULAR | Status: DC | PRN

## 2020-06-18 MED ORDER — DIBUCAINE (PERIANAL) 1 % EX OINT: 1.0000 "application " | TOPICAL_OINTMENT | CUTANEOUS | Status: DC | PRN

## 2020-06-18 MED ORDER — IBUPROFEN 800 MG PO TABS
800.0000 mg | ORAL_TABLET | Freq: Three times a day (TID) | ORAL | Status: DC
  Administered 2020-06-18 – 2020-06-21 (×10): 800 mg via ORAL
  Filled 2020-06-18 (×11): qty 1

## 2020-06-18 MED ORDER — DEXAMETHASONE SODIUM PHOSPHATE 4 MG/ML IJ SOLN
INTRAMUSCULAR | Status: DC | PRN
  Administered 2020-06-18: 4 mg via INTRAVENOUS

## 2020-06-18 MED ORDER — SODIUM CHLORIDE 0.9 % IV SOLN
INTRAVENOUS | Status: AC
  Filled 2020-06-18: qty 500

## 2020-06-18 MED ORDER — NALBUPHINE HCL 10 MG/ML IJ SOLN: 5.0000 mg | Freq: Once | INTRAMUSCULAR | Status: DC | PRN

## 2020-06-18 MED ORDER — LACTATED RINGERS IV SOLN: INTRAVENOUS | Status: DC | PRN

## 2020-06-18 MED ORDER — SIMETHICONE 80 MG PO CHEW
80.0000 mg | CHEWABLE_TABLET | ORAL | Status: DC
  Administered 2020-06-18 – 2020-06-21 (×3): 80 mg via ORAL
  Filled 2020-06-18 (×3): qty 1

## 2020-06-18 MED ORDER — DIPHENHYDRAMINE HCL 25 MG PO CAPS: 25.0000 mg | ORAL_CAPSULE | Freq: Four times a day (QID) | ORAL | Status: DC | PRN

## 2020-06-18 MED ORDER — PRENATAL MULTIVITAMIN CH
1.0000 | ORAL_TABLET | Freq: Every day | ORAL | Status: DC
  Administered 2020-06-18 – 2020-06-21 (×4): 1 via ORAL
  Filled 2020-06-18 (×5): qty 1

## 2020-06-18 MED ORDER — SODIUM CHLORIDE 0.9 % IV SOLN
2.0000 g | INTRAVENOUS | Status: AC
  Administered 2020-06-18: 2 g via INTRAVENOUS
  Filled 2020-06-18: qty 2

## 2020-06-18 MED ORDER — ONDANSETRON HCL 4 MG/2ML IJ SOLN
INTRAMUSCULAR | Status: AC
  Filled 2020-06-18: qty 2

## 2020-06-18 MED ORDER — SODIUM CHLORIDE 0.9 % IV SOLN
INTRAVENOUS | Status: AC
  Filled 2020-06-18: qty 2

## 2020-06-18 MED ORDER — COCONUT OIL OIL: 1.0000 "application " | TOPICAL_OIL | Status: DC | PRN

## 2020-06-18 MED ORDER — MORPHINE SULFATE (PF) 0.5 MG/ML IJ SOLN
INTRAMUSCULAR | Status: AC
  Filled 2020-06-18: qty 10

## 2020-06-18 MED ORDER — NALOXONE HCL 4 MG/10ML IJ SOLN
1.0000 ug/kg/h | INTRAVENOUS | Status: DC | PRN
  Filled 2020-06-18: qty 5

## 2020-06-18 MED ORDER — SODIUM CHLORIDE 0.9% FLUSH: 3.0000 mL | INTRAVENOUS | Status: DC | PRN

## 2020-06-18 MED ORDER — WITCH HAZEL-GLYCERIN EX PADS: 1.0000 "application " | MEDICATED_PAD | CUTANEOUS | Status: DC | PRN

## 2020-06-18 MED ORDER — OXYCODONE HCL 5 MG PO TABS
5.0000 mg | ORAL_TABLET | ORAL | Status: DC | PRN
  Administered 2020-06-19: 5 mg via ORAL
  Administered 2020-06-20 (×2): 10 mg via ORAL
  Filled 2020-06-18: qty 2
  Filled 2020-06-18: qty 1
  Filled 2020-06-18: qty 2
  Filled 2020-06-18: qty 1

## 2020-06-18 MED ORDER — MORPHINE SULFATE (PF) 0.5 MG/ML IJ SOLN: INTRAMUSCULAR | Status: DC | PRN

## 2020-06-18 MED ORDER — SIMETHICONE 80 MG PO CHEW
80.0000 mg | CHEWABLE_TABLET | Freq: Three times a day (TID) | ORAL | Status: DC
  Administered 2020-06-18 – 2020-06-21 (×9): 80 mg via ORAL
  Filled 2020-06-18 (×9): qty 1

## 2020-06-18 MED ORDER — SODIUM CHLORIDE 0.9 % IV SOLN
INTRAVENOUS | Status: DC | PRN
  Administered 2020-06-18: 60 ug/min via INTRAVENOUS

## 2020-06-18 MED ORDER — ONDANSETRON HCL 4 MG/2ML IJ SOLN: 4.0000 mg | Freq: Three times a day (TID) | INTRAMUSCULAR | Status: DC | PRN

## 2020-06-18 MED ORDER — MENTHOL 3 MG MT LOZG: 1.0000 | LOZENGE | OROMUCOSAL | Status: DC | PRN

## 2020-06-18 MED ORDER — TRANEXAMIC ACID-NACL 1000-0.7 MG/100ML-% IV SOLN
1000.0000 mg | INTRAVENOUS | Status: AC
  Administered 2020-06-18: 1000 mg via INTRAVENOUS

## 2020-06-18 MED ORDER — EPINEPHRINE PF 1 MG/ML IJ SOLN
INTRAMUSCULAR | Status: AC
  Filled 2020-06-18: qty 1

## 2020-06-18 MED ORDER — SIMETHICONE 80 MG PO CHEW: 80.0000 mg | CHEWABLE_TABLET | ORAL | Status: DC | PRN

## 2020-06-18 MED ORDER — KETAMINE HCL 10 MG/ML IJ SOLN
INTRAMUSCULAR | Status: DC | PRN
  Administered 2020-06-18: 40 mg via INTRAVENOUS

## 2020-06-18 MED ORDER — NALBUPHINE HCL 10 MG/ML IJ SOLN: 5.0000 mg | INTRAMUSCULAR | Status: DC | PRN

## 2020-06-18 MED ORDER — LEVONORGESTREL 19.5 MCG/DAY IU IUD
INTRAUTERINE_SYSTEM | INTRAUTERINE | Status: AC
  Filled 2020-06-18: qty 1

## 2020-06-18 MED ORDER — DIPHENHYDRAMINE HCL 25 MG PO CAPS: 25.0000 mg | ORAL_CAPSULE | ORAL | Status: DC | PRN

## 2020-06-18 MED ORDER — DEXMEDETOMIDINE (PRECEDEX) IN NS 20 MCG/5ML (4 MCG/ML) IV SYRINGE
PREFILLED_SYRINGE | INTRAVENOUS | Status: DC | PRN
  Administered 2020-06-18 (×4): 4 ug via INTRAVENOUS
  Administered 2020-06-18: 8 ug via INTRAVENOUS
  Administered 2020-06-18 (×3): 4 ug via INTRAVENOUS

## 2020-06-18 MED ORDER — LACTATED RINGERS IV SOLN: INTRAVENOUS | Status: DC

## 2020-06-18 MED ORDER — LIDOCAINE HCL (PF) 2 % IJ SOLN
INTRAMUSCULAR | Status: AC
  Filled 2020-06-18: qty 20

## 2020-06-18 MED ORDER — DIPHENHYDRAMINE HCL 50 MG/ML IJ SOLN: 12.5000 mg | INTRAMUSCULAR | Status: DC | PRN

## 2020-06-18 SURGICAL SUPPLY — 34 items
BENZOIN TINCTURE PRP APPL 2/3 (GAUZE/BANDAGES/DRESSINGS) ×3 IMPLANT
CHLORAPREP W/TINT 26ML (MISCELLANEOUS) ×6 IMPLANT
CLAMP CORD UMBIL (MISCELLANEOUS) IMPLANT
CLOSURE STERI STRIP 1/2 X4 (GAUZE/BANDAGES/DRESSINGS) ×3 IMPLANT
CLOTH BEACON ORANGE TIMEOUT ST (SAFETY) ×3 IMPLANT
DRSG OPSITE POSTOP 4X10 (GAUZE/BANDAGES/DRESSINGS) ×3 IMPLANT
ELECT REM PT RETURN 9FT ADLT (ELECTROSURGICAL) ×3
ELECTRODE REM PT RTRN 9FT ADLT (ELECTROSURGICAL) ×1 IMPLANT
EXTRACTOR VACUUM M CUP 4 TUBE (SUCTIONS) IMPLANT
EXTRACTOR VACUUM M CUP 4' TUBE (SUCTIONS)
GLOVE BIOGEL PI IND STRL 7.0 (GLOVE) ×3 IMPLANT
GLOVE BIOGEL PI INDICATOR 7.0 (GLOVE) ×6
GLOVE ECLIPSE 7.0 STRL STRAW (GLOVE) ×3 IMPLANT
GOWN STRL REUS W/TWL LRG LVL3 (GOWN DISPOSABLE) ×6 IMPLANT
HEMOSTAT SURGICEL 2X3 (HEMOSTASIS) ×3 IMPLANT
KIT ABG SYR 3ML LUER SLIP (SYRINGE) IMPLANT
NEEDLE HYPO 22GX1.5 SAFETY (NEEDLE) ×3 IMPLANT
NEEDLE HYPO 25X5/8 SAFETYGLIDE (NEEDLE) ×3 IMPLANT
NS IRRIG 1000ML POUR BTL (IV SOLUTION) ×3 IMPLANT
PACK C SECTION WH (CUSTOM PROCEDURE TRAY) ×3 IMPLANT
PAD ABD 7.5X8 STRL (GAUZE/BANDAGES/DRESSINGS) ×3 IMPLANT
PAD OB MATERNITY 4.3X12.25 (PERSONAL CARE ITEMS) ×3 IMPLANT
PENCIL SMOKE EVAC W/HOLSTER (ELECTROSURGICAL) ×3 IMPLANT
RTRCTR C-SECT PINK 25CM LRG (MISCELLANEOUS) IMPLANT
SUT MON AB-0 CT1 36 (SUTURE) ×3 IMPLANT
SUT PDS AB 0 CTX 36 PDP370T (SUTURE) IMPLANT
SUT PLAIN 2 0 XLH (SUTURE) IMPLANT
SUT VIC AB 0 CTX 36 (SUTURE) ×6
SUT VIC AB 0 CTX36XBRD ANBCTRL (SUTURE) ×3 IMPLANT
SUT VIC AB 4-0 KS 27 (SUTURE) ×3 IMPLANT
SYR CONTROL 10ML LL (SYRINGE) ×3 IMPLANT
TOWEL OR 17X24 6PK STRL BLUE (TOWEL DISPOSABLE) ×3 IMPLANT
TRAY FOLEY W/BAG SLVR 14FR LF (SET/KITS/TRAYS/PACK) ×3 IMPLANT
WATER STERILE IRR 1000ML POUR (IV SOLUTION) ×3 IMPLANT

## 2020-06-18 NOTE — Transfer of Care (Signed)
Immediate Anesthesia Transfer of Care Note  Patient: Scientist, clinical (histocompatibility and immunogenetics)  Procedure(s) Performed: CESAREAN SECTION (N/A )  Patient Location: PACU  Anesthesia Type:Epidural  Level of Consciousness: awake, alert  and oriented  Airway & Oxygen Therapy: Patient Spontanous Breathing  Post-op Assessment: Report given to RN and Post -op Vital signs reviewed and stable  Post vital signs: Reviewed and stable  Last Vitals:  Vitals Value Taken Time  BP 130/85 06/18/20 0908  Temp    Pulse 89 06/18/20 0911  Resp 15 06/18/20 0911  SpO2 96 % 06/18/20 0911  Vitals shown include unvalidated device data.  Last Pain:  Vitals:   06/18/20 0430  TempSrc: Oral  PainSc: Asleep         Complications: No complications documented.

## 2020-06-18 NOTE — Progress Notes (Signed)
Labor Progress Note Yassmine Tamm is a 29 y.o. G3P1011 at [redacted]w[redacted]d presented for contractions and PROM (9/14 @0100 ).   S: No symptoms reported, resolved chest pressure from before, EKG showed NSR.  O:  BP 131/76   Pulse 91   Temp 98.8 F (37.1 C) (Oral)   Resp 18   Ht 4\' 11"  (1.499 m)   Wt 97.5 kg   LMP 09/29/2019 (Approximate) Comment: Pt not sure  SpO2 97%   BMI 43.42 kg/m  EFM: baseline 120 bpm/mod variability/pos accels/no decels since pitocin was turned off Toco: q3-6 minutes  CVE: Dilation: 5 Effacement (%): 80 Cervical Position: Middle Station: -1 Presentation: Vertex Exam by:: 10/01/2019, RN  A&P: 86 y.o. Fabio Neighbors 103w5d presented for contractions and PROM (9/14@0100 ).   #Labor: S/p Cytotec x6. FB at 0545, now out. Pitocin started at 1522 but discontinued at 2300 given prolonged decels. Able to restart pitocin at 2315 given improvement in FHT s/p placement of IUPC with initiation of amnioinfusion. However, discontinued pitocin again at 0015 given recurrent decels. S/p 4 hour pitocin break. Pt restarted at 0430 but discontinued again at 0530 given prolonged deceleration.  Unable to restart pitocin.   Given fetal intolerance of labor, patient was counseled about cesarean section.The risks of cesarean section were discussed with the patient including but were not limited to: bleeding which may require transfusion or reoperation; infection which may require antibiotics; injury to bowel, bladder, ureters or other surrounding organs; injury to the fetus; need for additional procedures including hysterectomy in the event of a life-threatening hemorrhage; placental abnormalities wth subsequent pregnancies, incisional problems, thromboembolic phenomenon and other postoperative/anesthesia complications. Patient also desires post placental IUD at the time of delivery. Discussed risks/benefits of Liletta IUD, emphasized risk of possible malpositioning, expulsion rate and risk of failure of  0.5% with increased risk of ectopic pregnancy.  The patient concurred with the proposed plan, giving informed written consent for the procedures.  Anesthesia and OR aware.  Preoperative prophylactic antibiotics and SCDs ordered on call to the OR.  To OR when ready.   #Pain: Epidural in place  #FWB: Category 1 currently in the absence of pitocin  #GBS positive, PCN  #Elevated BP: Diagnosed on presentation to MAU. Initially called PEC w SF but after further review of case, feel that high BP on admission was secondary to cocaine use. Normal labs and normal urine P:C. Magnesium discontined at 10 AM on 9/15. Blood pressures most recently normal to mild range.  #Cocaine and EtOH use: monitor for s/sx of withdrawal, last drink 9/13 @1800 . Pt reported new chest pressure and dyspnea, likely 2/2 cardiac vasospasm.  NSR on EKG.  Troponin pending.   #Poor social situation: Unknown FOB, prostitution. Mother considering adoption for infant; SW attempted to meet x2 on 9/14, however not able to discuss with patient and will meet with her postpartum.   #Gonorrhea/chlamydia: diagnosed on admission. S/p rocephin/azithro on 9/13.  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    , MD 7:08 AM

## 2020-06-18 NOTE — Lactation Note (Signed)
This note was copied from a baby's chart. Lactation Consultation Note  Patient Name: Jordan Walton Today's Date: 06/18/2020  RN said that mom has decided to put baby up for adoption and that she does not want to initiate pumping or breastfeeding. LC left information in moms room while mom was sleeping.    Maternal Data    Feeding    LATCH Score                   Interventions    Lactation Tools Discussed/Used     Consult Status      Jakoby Melendrez Michaelle Copas 06/18/2020, 5:58 PM

## 2020-06-18 NOTE — Progress Notes (Signed)
CSW completed [facility] CPS report for MOB's substance use and decision not to parent/not interested in adoption. Currently there are barriers to infant discharging to MOB. Awaiting call from CPS.   Celso Sickle, LCSW Clinical Social Worker [facility] Cell#: [phone]

## 2020-06-18 NOTE — Anesthesia Postprocedure Evaluation (Signed)
Anesthesia Post Note  Patient: Scientist, clinical (histocompatibility and immunogenetics)  Procedure(s) Performed: CESAREAN SECTION (N/A )     Patient location during evaluation: PACU Anesthesia Type: Epidural Level of consciousness: oriented and awake and alert Pain management: pain level controlled Vital Signs Assessment: post-procedure vital signs reviewed and stable Respiratory status: spontaneous breathing, respiratory function stable and nonlabored ventilation Cardiovascular status: blood pressure returned to baseline and stable Postop Assessment: no headache, no backache, no apparent nausea or vomiting, epidural receding and patient able to bend at knees Anesthetic complications: no   No complications documented.  Last Vitals:  Vitals:   06/18/20 0945 06/18/20 1000  BP: (!) 137/93 (!) 135/98  Pulse: 87 84  Resp: (!) 28 14  Temp:  37.1 C  SpO2: 99% 98%    Last Pain:  Vitals:   06/18/20 1000  TempSrc: Oral  PainSc: Asleep   Pain Goal:    LLE Motor Response: Purposeful movement (06/18/20 1000)   RLE Motor Response: Purposeful movement (06/18/20 1000)       Epidural/Spinal Function Cutaneous sensation: Able to Discern Pressure (06/18/20 1000), Patient able to flex knees: Yes (06/18/20 1000), Patient able to lift hips off bed: No (06/18/20 1000), Back pain beyond tenderness at insertion site: No (06/18/20 1000), Progressively worsening motor and/or sensory loss: No (06/18/20 1000), Bowel and/or bladder incontinence post epidural: No (06/18/20 1000)  Jordane Hisle A.

## 2020-06-18 NOTE — Clinical Social Work Maternal (Signed)
CLINICAL SOCIAL WORK MATERNAL/CHILD NOTE  Patient Details  Name: Jordan Walton MRN: [id number] Date of Birth: 01/07/1991  Date:  06/18/2020  Clinical Social Worker Initiating Note:  Arlette Schaad, LCSW Date/Time: Initiated:  06/18/20/1415     Child's Name:  Jordan Walton   Biological Parents:  Mother   Need for Interpreter:  None   Reason for Referral:  Behavioral Health Concerns, Current Substance Use/Substance Use During Pregnancy , Late or No Prenatal Care , Other (Comment) (Interested in Foster Care)   Address:  [address] [place] [address]    Phone number: Mom's phone number:  [phone] (home)   Dad's phone number: [phone]  Additional phone number:   Household Members/Support Persons (HM/SP):   Household Member/Support Person 1   HM/SP Name Relationship DOB or Age  HM/SP -1  Jordan Walton mom    HM/SP -2        HM/SP -3        HM/SP -4        HM/SP -5        HM/SP -6        HM/SP -7        HM/SP -8          Natural Supports (not living in the home):  Parent, Community   Professional Supports: Other (Comment) (Founder of [facility])   Employment: Unemployed   Type of Work:     Education:  9 to 11 years (11th Grade)   Homebound arranged: No  Financial Resources:  Self-Pay    Other Resources:      Cultural/Religious Considerations Which May Impact Care:    Strengths:      Psychotropic Medications:         Pediatrician:       Pediatrician List:   [place]    [place]    [place]    [place]    [place]    [place]      Pediatrician Fax Number:    Risk Factors/Current Problems:  Mental Health Concerns , Basic Needs , Substance Use    Cognitive State:  Able to Concentrate , Alert , Linear Thinking , Insightful , Goal Oriented    Mood/Affect:  Calm , Interested , Comfortable    CSW Assessment: CSW met with MOB at bedside to discuss substance use during pregnancy,  behavioral health concerns and parenting plans for infant. CSW introduced self and explained reason for consult. MOB was welcoming, open, pleasant and remained engaged during assessment. MOB reported that she resides with her mom and does not have housing issues. MOB reported that she plans to go into treatment soon at [facility]. MOB reported that the founder (Bonnie [phone]) is one of her supports and she is ready to seek help. CSW asked if there was anything CSW could do to help MOB get into treatment. MOB reported saying some good things about her. CSW agreed to contact program to inquire about referral process and agreed to make referral if needed with MOB's permission, MOB agreeable to plan. MOB reported that her mom and dad are also supports. MOB reported that she does not plan to parent infant and wants to focus on getting herself together. MOB reported that she needs years to get clean and does not want to get attached to infant. CSW positively affirmed MOB's plan to work on herself and get treatment. MOB reported that she is no longer interested in adoption. CSW explained to MOB that CPS   would be contacted and would assist MOB in creating a safety plan for infant, MOB verbalized understanding. MOB reported that she has another child (Jordan Walton 07/08/09) that resides in [place] with her father (child's father). MOB reported that CPS was not involved in this plan. MOB reported that infant's father is unknown.   CSW inquired about MOB's mental health history. MOB reported that she was diagnosed with anxiety, depression, bipolar disorder and PTSD. MOB could not recall when she received these diagnoses. MOB reported that she has been experiencing anxiety around her pregnancy and depression when she was first admitted feeling sad. CSW inquired about how MOB was feeling emotionally after giving birth, MOB reported that she is feeling okay. CSW spoke with MOB about feelings that may arise  surrounding not parenting infant and encouraged MOB to seek therapy to process those feelings. MOB reported that she last participated in therapy seven months ago and it was helpful at the time. CSW encouraged MOB to return to therapy. MOB reported that she is not taking any medication to treat her mental health diagnoses and does not want to. MOB presented calm and with limited insight about her mental health history. MOB spoke at length about her readiness to get treatment for her addiction. MOB did not demonstrate any acute mental health signs/symptoms. CSW assessed for safety, MOB denied SI, HI and domestic violence.   CSW provided education regarding the baby blues period vs. perinatal mood disorders, discussed treatment and gave resources for mental health follow up if concerns arise.  CSW recommends self-evaluation during the postpartum time period using the New Mom Checklist from Postpartum Progress and encouraged MOB to contact a medical professional if symptoms are noted at any time.    CSW informed MOB about the hospital drug screen policy due to substance use during pregnancy. MOB confirmed cocaine use daily and denied any other substance use during pregnancy. CSW informed MOB that a CPS report would be made due to MOB's plan not to parent and substance use, MOB verbalized understanding and denied any questions.     CSW contacted [place] [facility] CPS to make a report. CSW awaiting return call to make CPS report.   CSW contacted [facility] ([phone]) to inquire about referral process, staff reported that MOB will just have to call and complete interview process.   CSW contacted MOB and provided update that MOB will need to call to complete interview process for [facility], MOB reported that she was aware and that the founder was coming to meet with her later today. MOB denied any additional needs/concerns.   Currently there are barriers to infant discharging to MOB.    CSW  Plan/Description:  Perinatal Mood and Anxiety Disorder (PMADs) Education, Child Protective Service Report , Hospital Drug Screen Policy Information, CSW Awaiting CPS Disposition Plan, Other Information/Referral to Community Resources    Tamra Koos L Sherley Mckenney, LCSW 06/18/2020, 2:19 PM 

## 2020-06-18 NOTE — Progress Notes (Signed)
Labor Progress Note Jordan Walton is a 29 y.o. G3P1011 at [redacted]w[redacted]d presented for contractions and PROM (9/14 @0100 ).   S: Pt reports feeling better after getting some rest. No concerns at this time.  O:  BP 131/69   Pulse 89   Temp 98.2 F (36.8 C) (Oral)   Resp 20   Ht 4\' 11"  (1.499 m)   Wt 97.5 kg   LMP 09/29/2019 (Approximate) Comment: Pt not sure  SpO2 96%   BMI 43.42 kg/m  EFM: baseline 120 bpm/mod variability/pos accels/intermittent variable decels Toco: q3-5 minutes  CVE: Dilation: 5 Effacement (%): 80 Cervical Position: Middle Station: 0 (capput) Presentation: Vertex Exam by:: Dr. 10/01/2019   A&P: 29 y.o. Jordan Walton [redacted]w[redacted]d presented for contractions and PROM (9/14@0100 ).  #Labor: S/p Cytotec x6. FB at 0545, now out. Pitocin started at 1522 but discontinued at 2300 given prolonged decels. Able to restart pitocin at 2315 given improvement in FHT s/p placement of IUPC with initiation of amnioinfusion. However, discontinued pitocin again at 0015 given recurrent decels. Slight change in cervical exam with improved FHT s/p 4 hour pitocin break, so will plan to restart pitocin at this time. #Pain: Epidural in place #FWB: Category 2 strip given intermittent variable decels but reassuringly, appropriate baseline with good variability. #GBS positive, PCN  #Elevated BP: Diagnosed on presentation to MAU. Initially called PEC w SF but after further review of case, feel that high BP on admission was secondary to cocaine use. Normal labs and normal urine P:C. Magnesium discontined at 10 AM on 9/15. Blood pressures most recently normal to mild range. No HA, vision changes or other concerning symptoms.  #Cocaine and EtOH use: monitor for s/sx of withdrawal, last drink 9/13 @1800 .  #Poor social situation:  Unknown FOB, prostitution. Mother considering adoption for infant; SW attempted to meet x2 on 9/14, however not able to discuss with patient and will meet with her postpartum.    #Gonorrhea/chlamydia: diagnosed on admission. S/p rocephin/azithro on 9/13.  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   , MD 4:29 AM

## 2020-06-18 NOTE — Progress Notes (Signed)
RN went into pt's room to assess patient's readiness to get up out of bed. Pt still drowsy but easy to arouse. Pt asked if she is able to try to get up out of bed at this time. Pt requested for RN to "come back later because my friend is coming to visit with me." Will try again later.

## 2020-06-18 NOTE — Op Note (Signed)
Jordan Walton PROCEDURE DATE: 06/18/2020  PREOPERATIVE DIAGNOSES:  1. Intrauterine pregnancy at [redacted]w[redacted]d weeks gestation 2. Fetal intolerance of labor with recurrent decelerations when pitocin was administered, current Category I FHR tracing in absence of pitocin 3. Meconium in amniotic fluid 4. Maternal cocaine and alcohol abuse 5. Maternal gonorrhea and chlamydia infections (after treatment on 06/16/20) 6. Limited prenatal care, only had one prenatal visit 7. Desires long term reversible contraception with intrauterine device  POSTOPERATIVE DIAGNOSES: The same, placental abruption,  PROCEDURE: Primary Low Transverse Cesarean Section and Liletta Intrauterine Device Placement  SURGEON:  Dr. Jaynie Collins  ASSISTANT:  Dr. Lynnda Shields  ANESTHESIOLOGY TEAM: Anesthesiologist: Mal Amabile, MD; Lewie Loron, MD; Trevor Iha, MD; Lowella Curb, MD CRNA: Cleda Clarks, CRNA  INDICATIONS: Jordan Walton is a 29 y.o. [phone] at [redacted]w[redacted]d here for aforementioned procedures secondary to the indications listed under preoperative diagnoses; please see preoperative note for further details.  The risks of surgery were discussed with the patient including but were not limited to: bleeding which may require transfusion or reoperation; infection which may require antibiotics; injury to bowel, bladder, ureters or other surrounding organs; injury to the fetus; need for additional procedures including hysterectomy in the event of a life-threatening hemorrhage; formation of adhesions; placental abnormalities wth subsequent pregnancies; incisional problems; thromboembolic phenomenon and other postoperative/anesthesia complications.   For the postplacental IUD placement, discussed risks of postpartum expulsion, irregular bleeding, cramping, infection, malpositioning or misplacement of the IUD which may require further procedures. Also discussed >99% contraception efficacy, increased risk of ectopic  pregnancy with failure of method.   The patient concurred with the proposed plan, giving informed written consent for the procedures.    FINDINGS:  Viable female infant in cephalic presentation. Moderate meconium noted.  About 300 ml of dark clotted blood noted being placenta consistent with abruption.  Atraumatic delivery, but infant noted to have immediate respiratory difficulties despite meconium aspiration attempts.  Code Blue - Neonatal was called.   Apgars 1/3/5, cord arterial pH 7.093, pCO2 19.4, Bicarbonate 23.2. Infant taken immediately to NICU.  Placenta delivered intact, three vessel cord.  Normal uterus, fallopian tubes and ovaries bilaterally. Liletta was placed.  ANESTHESIA: Epidural  ESTIMATED BLOOD LOSS: 1124 ml SPECIMENS: Placenta sent to pathology COMPLICATIONS: None immediate  PROCEDURE IN DETAIL:  The patient preoperatively received intravenous antibiotics and had sequential compression devices applied to her lower extremities.  She was then taken to the operating room where the epidural anesthesia was dosed up to surgical level and was found to be adequate. She was then placed in a dorsal supine position with a leftward tilt, and prepped and draped in a sterile manner.  A foley catheter was placed into her bladder and attached to constant gravity.  After an adequate timeout was performed, a Pfannenstiel skin incision was made with scalpel and carried through to the underlying layer of fascia. The fascia was incised in the midline, and this incision was extended bilaterally using the Mayo scissors.  Kocher clamps were applied to the superior aspect of the fascial incision and the underlying rectus muscles were dissected off bluntly and sharply.  A similar process was carried out on the inferior aspect of the fascial incision. The rectus muscles were separated in the midline and the peritoneum was entered bluntly. The Alexis self-retaining retractor was introduced into the abdominal cavity.   Attention was turned to the lower uterine segment where a low transverse hysterotomy was made with a scalpel and extended bilaterally bluntly.  The infant was successfully delivered, the cord was clamped and cut after one minute, and the infant was handed over to the awaiting neonatology team. Uterine massage was then administered, and the placenta delivered intact with a three-vessel cord. The uterus was then cleared of clots and debris.  The Liletta IUD was then placed in the upper fundal location, and the strings were pushed down through the cervix into the vagina.  The hysterotomy was closed with 0 Vicryl in a running locked fashion, and an imbricating layer was also placed with 0 Vicryl.  Figure-of-eight 0 Vicryl serosal stitches were placed to help with hemostasis.  The pelvis was cleared of all clot and debris. Hemostasis was confirmed on all surfaces.  The retractor was removed.  The peritoneum was closed with a 0 Vicryl running stitch. The fascia was then closed using 0 Vicryl in a running fashion.  The subcutaneous layer was irrigated, reapproximated with 2-0 plain gut interrupted stitches, and the skin was closed with a 4-0 Vicryl subcuticular stitch. The patient tolerated the procedure well. Sponge, instrument and needle counts were correct x 3.  She was taken to the recovery room in stable condition.    Jaynie Collins, MD, FACOG Obstetrician & Gynecologist, [facility] [employer], [facility]

## 2020-06-18 NOTE — Discharge Summary (Signed)
Postpartum Discharge Summary  Date of Service updated    Patient Name: Jordan Walton DOB: [birth year] MRN: [id number]  Date of admission: 06/16/2020 Delivery date:06/18/2020  Delivering provider: Verita Schneiders A  Date of discharge: 06/21/20  Admitting diagnosis: PROM (premature rupture of membranes) [O42.90] Intrauterine pregnancy: [redacted]w[redacted]d    Secondary diagnosis:  Principal Problem:   Cesarean delivery delivered Active Problems:   Cocaine abuse ([place]   Cocaine abuse with cocaine-induced mood disorder ([place]   Substance induced mood disorder ([place]   Bipolar disorder, current episode depressed, mild or moderate severity, unspecified ([place]   Schizoaffective disorder, bipolar type ([place]   Major depressive disorder, recurrent episode ([place]   Bipolar I disorder, most recent episode depressed, severe without psychotic features ([place]   History of herpes genitalis   Major depressive disorder, recurrent severe without psychotic features ([place]   Polysubstance abuse ([place]   History of suicidal ideation   Tobacco dependence   Uncomplicated alcohol dependence ([place]   Supervision of high risk pregnancy, antepartum   Crack cocaine use   Obesity in pregnancy   Psychosocial stressors   History of gestational hypertension   Alpha thalassemia silent carrier   PROM (premature rupture of membranes)   Postpartum care following cesarean delivery  Additional problems: as noted above  Discharge diagnosis: Cesarean delivery delivered                                              Post partum procedures:postplacental Liletta insertion Augmentation: Pitocin, Cytotec and IP Foley Complications: Placental Abruption  Hospital course: Onset of Labor With Unplanned C/S   29y.o. yo [id number] admitted is/p PROM on 06/16/2020. On admission, she was started on magnesium given persistent severe range blood pressures; however, magnesium was discontinued prior to delivery given hypertension most  likely secondary to recent cocaine use and not preeclampsia. Patient had a labor course significant for failed induction--unsuccessful in advancing to active phase of labor given fetal intolerance of pitocin. Pt's cervical exam did not advance past 5cm. The patient went for cesarean section due to Non-Reassuring FHR in the setting of recent cocaine and alcohol use and ultimately placental abruption. Delivery details as follows: Membrane Rupture Time/Date: 1:00 AM ,06/16/2020   Delivery Method:C-Section, Low Transverse  Details of operation can be found in separate operative note. Patient had a postpartum course complicated by hypertension.  She is ambulating,tolerating a regular diet, passing flatus, and urinating well.  Patient is discharged home in stable condition 06/20/20.  Newborn Data: Birth date:06/18/2020  Birth time:8:05 AM  Gender:Female  Living status:Living  Apgars:1 ,3  Weight:3000 g   Magnesium Sulfate received: Yes: Seizure prophylaxis (ultimately discontinued prior to delivery given elevated blood pressures likely secondary to recent cocaine use and NOT preeclampsia) BMZ received: No Rhophylac:N/A MMR:N/A T-DaP:Given prenatally Flu: No Transfusion:No  Physical exam  Vitals:   06/19/20 1145 06/19/20 1607 06/19/20 1941 06/19/20 2337  BP: 129/65 135/82 (!) 150/80 (!) 154/73  Pulse: 85 91 94 96  Resp: 18 17 18 18   Temp: 97.8 F (36.6 C) 97.9 F (36.6 C) 98 F (36.7 C) 98 F (36.7 C)  TempSrc: Oral Oral Oral Oral  SpO2: 99% 100% 99% 99%  Weight:      Height:       General: alert, cooperative and no distress Lochia: appropriate Uterine Fundus: firm Incision: Healing  well with no significant drainage DVT Evaluation: No evidence of DVT seen on physical exam. Labs: Lab Results  Component Value Date   WBC 13.6 (H) 06/19/2020   HGB 8.3 (L) 06/19/2020   HCT 27.0 (L) 06/19/2020   MCV 87.4 06/19/2020   PLT 267 06/19/2020   CMP Latest Ref Rng & Units 06/19/2020   Glucose 70 - 99 mg/dL 93  BUN 6 - 20 mg/dL 7  Creatinine 0.44 - 1.00 mg/dL 0.88  Sodium 135 - 145 mmol/L 135  Potassium 3.5 - 5.1 mmol/L 4.5  Chloride 98 - 111 mmol/L 105  CO2 22 - 32 mmol/L 23  Calcium 8.9 - 10.3 mg/dL 8.8(L)  Total Protein 6.5 - 8.1 g/dL 4.8(L)  Total Bilirubin 0.3 - 1.2 mg/dL <0.1(L)  Alkaline Phos 38 - 126 U/L 117  AST 15 - 41 U/L 14(L)  ALT 0 - 44 U/L 10   Edinburgh Score: Edinburgh Postnatal Depression Scale Screening Tool 06/19/2020  I have been able to laugh and see the funny side of things. (No Data)     After visit meds:    Discharge home in stable condition Infant Feeding: Bottle Infant Disposition:NICU Discharge instruction: per After Visit Summary and Postpartum booklet. Activity: Advance as tolerated. Pelvic rest for 6 weeks.  Diet: routine diet Future Appointments:No future appointments. Follow up Visit:   Please schedule this patient for a In person postpartum visit in 4 weeks with the following provider: MD. Additional Postpartum F/U:Postpartum Depression checkup, Incision check 1 week and Social work check 1 week  High risk pregnancy complicated by: Cocaine & Alcohol use in pregnancy, Anxiety/Depression, homelessness, limited [facility] Delivery mode:  C-Section, Low Transverse  Anticipated Birth Control:  PP IUD placed [place])   06/20/2020 Randa Ngo, MD

## 2020-06-18 NOTE — Progress Notes (Addendum)
Labor Progress Note Jordan Walton is a 29 y.o. G3P1011 at [redacted]w[redacted]d presented for contractions and PROM (9/14 @0100 ).   S: Pt reports new chest pressure in addition to slight RLQ pain, now resolved. Denies HA, vision changes and other concerns. Reports "she cannot breathe when the pitocin is restarted".  O:  BP 131/84   Pulse (!) 102   Temp 98.8 F (37.1 C) (Oral)   Resp 20   Ht 4\' 11"  (1.499 m)   Wt 97.5 kg   LMP 09/29/2019 (Approximate) Comment: Pt not sure  SpO2 99%   BMI 43.42 kg/m  EFM: baseline 120 bpm/mod variability/pos accels/prolonged decel @0530  Toco: q4-5 minutes  CVE: Dilation: 5 Effacement (%): 80 Cervical Position: Middle Station: 0 (capput) Presentation: Vertex Exam by:: Dr. 10/01/2019   A&P: 29 y.o. [id number] [redacted]w[redacted]d presented for contractions and PROM (9/14@0100 ).  #Labor: S/p Cytotec x6. FB at 0545, now out. Pitocin started at 1522 but discontinued at 2300 given prolonged decels. Able to restart pitocin at 2315 given improvement in FHT s/p placement of IUPC with initiation of amnioinfusion. However, discontinued pitocin again at 0015 given recurrent decels. S/p 4 hour pitocin break. Pt restarted at 0430 but discontinued again at 0530 given prolonged deceleration. Pt continues to have minimal cervical change but not yet in active phase of labor. Will continue to monitor closely. #Pain: Epidural in place #FWB: Category 2 strip given prolonged decleration, but reassuringly appropriate baseline with good variability. #GBS positive, PCN  #Elevated BP: Diagnosed on presentation to MAU. Initially called PEC w SF but after further review of case, feel that high BP on admission was secondary to cocaine use. Normal labs and normal urine P:C. Magnesium discontined at 10 AM on 9/15. Blood pressures most recently normal to mild range. No HA, vision changes but pt now reporting new chest pressure and dyspnea. Will repeat PreE labs at this time.  #Cocaine and EtOH use: monitor for s/sx  of withdrawal, last drink 9/13 @1800 . Pt now reporting new chest pressure and dyspnea, likely 2/2 cardiac vasospasm. Will obtain repeat PreE labs in addition to EKG and troponin.  #Poor social situation: Unknown FOB, prostitution. Mother considering adoption for infant; SW attempted to meet x2 on 9/14, however not able to discuss with patient and will meet with her postpartum.   #Gonorrhea/chlamydia: diagnosed on admission. S/p rocephin/azithro on 9/13.  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   Called Dr. 10/13 on vocera at 0600 to update on patient status. Dr. agreed with plan as noted above. If cervix still unchanged at 0800 will proceed to Cesarean.  10/14, MD 6:02 AM

## 2020-06-19 ENCOUNTER — Encounter (HOSPITAL_COMMUNITY): Payer: Self-pay | Admitting: Obstetrics and Gynecology

## 2020-06-19 LAB — COMPREHENSIVE METABOLIC PANEL
ALT: 10 U/L (ref 0–44)
AST: 14 U/L — ABNORMAL LOW (ref 15–41)
Albumin: 1.6 g/dL — ABNORMAL LOW (ref 3.5–5.0)
Alkaline Phosphatase: 117 U/L (ref 38–126)
Anion gap: 7 (ref 5–15)
BUN: 7 mg/dL (ref 6–20)
CO2: 23 mmol/L (ref 22–32)
Calcium: 8.8 mg/dL — ABNORMAL LOW (ref 8.9–10.3)
Chloride: 105 mmol/L (ref 98–111)
Creatinine, Ser: 0.88 mg/dL (ref 0.44–1.00)
GFR calc Af Amer: >60 mL/min (ref >60)
GFR calc non Af Amer: >60 mL/min (ref >60)
Glucose, Bld: 93 mg/dL (ref 70–99)
Potassium: 4.5 mmol/L (ref 3.5–5.1)
Sodium: 135 mmol/L (ref 135–145)
Total Bilirubin: <0.1 mg/dL — ABNORMAL LOW (ref 0.3–1.2)
Total Protein: 4.8 g/dL — ABNORMAL LOW (ref 6.5–8.1)

## 2020-06-19 LAB — CBC
HCT: 27 % — ABNORMAL LOW (ref 36.0–46.0)
Hemoglobin: 8.3 g/dL — ABNORMAL LOW (ref 12.0–15.0)
MCH: 26.9 pg (ref 26.0–34.0)
MCHC: 30.7 g/dL (ref 30.0–36.0)
MCV: 87.4 fL (ref 80.0–100.0)
Platelets: 267 10*3/uL (ref 150–400)
RBC: 3.09 MIL/uL — ABNORMAL LOW (ref 3.87–5.11)
RDW: 14.1 % (ref 11.5–15.5)
WBC: 13.6 10*3/uL — ABNORMAL HIGH (ref 4.0–10.5)
nRBC: 0 % (ref 0.0–0.2)

## 2020-06-19 NOTE — Progress Notes (Signed)
Subjective: Postpartum Day 2: Cesarean Delivery Patient reports incisional pain.    Objective: Vital signs in last 24 hours: Temp:  [97.5 F (36.4 C)-98.7 F (37.1 C)] 97.8 F (36.6 C) (09/17 0743) Pulse Rate:  [73-93] 79 (09/17 0743) Resp:  [18-24] 18 (09/17 0743) BP: (108-153)/(58-95) 123/60 (09/17 0743) SpO2:  [96 %-99 %] 99 % (09/17 0743)  Physical Exam:  General: alert, cooperative and no distress Lochia: appropriate Uterine Fundus: firm Incision: no significant drainage DVT Evaluation: No evidence of DVT seen on physical exam.  Recent Labs    06/18/20 0604 06/19/20 0528  HGB 10.5* 8.3*  HCT 34.2* 27.0*    Assessment/Plan: Status post Cesarean section. Postoperative course complicated by postoperative pain  Ambulate and pain management.  Jordan Walton 06/19/2020, 10:11 AM

## 2020-06-19 NOTE — Progress Notes (Signed)
CSW received call from [facility] CPS social worker Jason Coop) who reported that she plans to visit with MOB today.   Celso Sickle, LCSW Clinical Social Worker [facility] Cell#: [phone]

## 2020-06-19 NOTE — Progress Notes (Signed)
[place] [employer] CPS social worker (Sheila Cheek) came to visit with MOB and viewed infant. CSW informed CPS social worker that infant could possibly be ready for discharge this weekend, CPS social worker verbalized understanding. CPS social worker reported that she is waiting for MOB's support to arrive and will meet with MOB and MOB's support together. CPS social worker requested to be escorted out while waiting. CSW escorted CPS social worker to first floor security area and informed CPS social worker to call CSW when she is ready to be escorted back to MOB's room.   CSW received a voicemail from CPS social work supervisor (Susie Edwards) who reported that CPS social worker had to leave and would not be returning today. CPS social work supervisor reported that they plan to have a CFT meeting with MOB on Monday regarding infant's safety plan.   CSW contacted CPS social work supervisor (Susie Edwards) to inquire about infant's discharge plan, no answer. CSW left voicemail requesting return phone call.   Currently there are barriers to infant discharging to MOB. Infant's discharge plan undecided at this time.   Hayden Mabin, LCSW Clinical Social Worker [facility] Cell#: [phone]  

## 2020-06-20 MED ORDER — NIFEDIPINE ER OSMOTIC RELEASE 30 MG PO TB24
30.0000 mg | ORAL_TABLET | Freq: Every day | ORAL | Status: DC
  Administered 2020-06-20 – 2020-06-21 (×2): 30 mg via ORAL
  Filled 2020-06-20 (×2): qty 1

## 2020-06-21 MED ORDER — IBUPROFEN 800 MG PO TABS
800.0000 mg | ORAL_TABLET | Freq: Three times a day (TID) | ORAL | 0 refills | Status: DC
Start: 2020-06-21 — End: 2020-12-11

## 2020-06-21 MED ORDER — ONDANSETRON HCL 4 MG/2ML IJ SOLN: 4.0000 mg | Freq: Three times a day (TID) | INTRAMUSCULAR | 0 refills | Status: DC | PRN

## 2020-06-21 MED ORDER — NIFEDIPINE ER 30 MG PO TB24
30.0000 mg | ORAL_TABLET | Freq: Every day | ORAL | 2 refills | Status: DC
Start: 2020-06-21 — End: 2021-03-25

## 2020-06-21 MED ORDER — OXYCODONE HCL 5 MG PO TABS
5.0000 mg | ORAL_TABLET | ORAL | 0 refills | Status: DC | PRN
Start: 2020-06-21 — End: 2021-02-19

## 2020-06-21 NOTE — Progress Notes (Signed)
Pt discharged in stable condition with all belongings

## 2020-06-21 NOTE — Progress Notes (Signed)
Pt given discharge instructions. All questions answered. Pt awaiting ride home.

## 2020-06-21 NOTE — Discharge Instructions (Signed)
Cesarean Delivery, Care After This sheet gives you information about how to care for yourself after your procedure. Your health care provider may also give you more specific instructions. If you have problems or questions, contact your health care provider. What can I expect after the procedure? After the procedure, it is common to have:  A small amount of blood or clear fluid coming from the incision.  Some redness, swelling, and pain in your incision area.  Some abdominal pain and soreness.  Vaginal bleeding (lochia). Even though you did not have a vaginal delivery, you will still have vaginal bleeding and discharge.  Pelvic cramps.  Fatigue. You may have pain, swelling, and discomfort in the tissue between your vagina and your anus (perineum) if:  Your C-section was unplanned, and you were allowed to labor and push.  An incision was made in the area (episiotomy) or the tissue tore during attempted vaginal delivery. Follow these instructions at home: Incision care   Follow instructions from your health care provider about how to take care of your incision. Make sure you: ? Wash your hands with soap and water before you change your bandage (dressing). If soap and water are not available, use hand sanitizer. ? If you have a dressing, change it or remove it as told by your health care provider. ? Leave stitches (sutures), skin staples, skin glue, or adhesive strips in place. These skin closures may need to stay in place for 2 weeks or longer. If adhesive strip edges start to loosen and curl up, you may trim the loose edges. Do not remove adhesive strips completely unless your health care provider tells you to do that.  Check your incision area every day for signs of infection. Check for: ? More redness, swelling, or pain. ? More fluid or blood. ? Warmth. ? Pus or a bad smell.  Do not take baths, swim, or use a hot tub until your health care provider says it's okay. Ask your health  care provider if you can take showers.  When you cough or sneeze, hug a pillow. This helps with pain and decreases the chance of your incision opening up (dehiscing). Do this until your incision heals. Medicines  Take over-the-counter and prescription medicines only as told by your health care provider.  If you were prescribed an antibiotic medicine, take it as told by your health care provider. Do not stop taking the antibiotic even if you start to feel better.  Do not drive or use heavy machinery while taking prescription pain medicine. Lifestyle  Do not drink alcohol. This is especially important if you are breastfeeding or taking pain medicine.  Do not use any products that contain nicotine or tobacco, such as cigarettes, e-cigarettes, and chewing tobacco. If you need help quitting, ask your health care provider. Eating and drinking  Drink at least 8 eight-ounce glasses of water every day unless told not to by your health care provider. If you breastfeed, you may need to drink even more water.  Eat high-fiber foods every day. These foods may help prevent or relieve constipation. High-fiber foods include: ? Whole grain cereals and breads. ? Brown rice. ? Beans. ? Fresh fruits and vegetables. Activity   If possible, have someone help you care for your baby and help with household activities for at least a few days after you leave the hospital.  Return to your normal activities as told by your health care provider. Ask your health care provider what activities are safe for   you.  Rest as much as possible. Try to rest or take a nap while your baby is sleeping.  Do not lift anything that is heavier than 10 lbs (4.5 kg), or the limit that you were told, until your health care provider says that it is safe.  Talk with your health care provider about when you can engage in sexual activity. This may depend on your: ? Risk of infection. ? How fast you heal. ? Comfort and desire to  engage in sexual activity. General instructions  Do not use tampons or douches until your health care provider approves.  Wear loose, comfortable clothing and a supportive and well-fitting bra.  Keep your perineum clean and dry. Wipe from front to back when you use the toilet.  If you pass a blood clot, save it and call your health care provider to discuss. Do not flush blood clots down the toilet before you get instructions from your health care provider.  Keep all follow-up visits for you and your baby as told by your health care provider. This is important. Contact a health care provider if:  You have: ? A fever. ? Bad-smelling vaginal discharge. ? Pus or a bad smell coming from your incision. ? Difficulty or pain when urinating. ? A sudden increase or decrease in the frequency of your bowel movements. ? More redness, swelling, or pain around your incision. ? More fluid or blood coming from your incision. ? A rash. ? Nausea. ? Little or no interest in activities you used to enjoy. ? Questions about caring for yourself or your baby.  Your incision feels warm to the touch.  Your breasts turn red or become painful or hard.  You feel unusually sad or worried.  You vomit.  You pass a blood clot from your vagina.  You urinate more than usual.  You are dizzy or light-headed. Get help right away if:  You have: ? Pain that does not go away or get better with medicine. ? Chest pain. ? Difficulty breathing. ? Blurred vision or spots in your vision. ? Thoughts about hurting yourself or your baby. ? New pain in your abdomen or in one of your legs. ? A severe headache.  You faint.  You bleed from your vagina so much that you fill more than one sanitary pad in one hour. Bleeding should not be heavier than your heaviest period. Summary  After the procedure, it is common to have pain at your incision site, abdominal cramping, and slight bleeding from your vagina.  Check  your incision area every day for signs of infection.  Tell your health care provider about any unusual symptoms.  Keep all follow-up visits for you and your baby as told by your health care provider. This information is not intended to replace advice given to you by your health care provider. Make sure you discuss any questions you have with your health care provider. Document Revised: 03/28/2018 Document Reviewed: 03/28/2018 Elsevier Patient Education  2020 Elsevier Inc.  

## 2020-06-21 NOTE — Progress Notes (Signed)
Subjective: Postpartum Day 3: Cesarean Delivery Patient reports incisional pain, tolerating PO and no problems voiding.    Objective: Vital signs in last 24 hours: Temp:  [98.1 F (36.7 C)-98.6 F (37 C)] 98.1 F (36.7 C) (09/19 0500) Pulse Rate:  [85-118] 96 (09/19 0500) Resp:  [18-20] 18 (09/19 0500) BP: (119-151)/(63-92) 128/80 (09/19 0500) SpO2:  [97 %-100 %] 98 % (09/19 0500)  Physical Exam:  General: alert, cooperative and no distress Lochia: appropriate Uterine Fundus: firm Incision: healing well DVT Evaluation: No evidence of DVT seen on physical exam.  Recent Labs    06/19/20 0528  HGB 8.3*  HCT 27.0*    Assessment/Plan: Status post Cesarean section. Doing well postoperatively.  Continue current care Add procardia 30 XL for elevated BP management Anticipate discharge tomorrow.  Lazaro Arms

## 2020-06-22 LAB — SURGICAL PATHOLOGY

## 2020-06-29 ENCOUNTER — Ambulatory Visit: Payer: Self-pay

## 2020-07-07 ENCOUNTER — Ambulatory Visit: Payer: Self-pay | Admitting: Clinical

## 2020-07-07 DIAGNOSIS — Z91199 Patient's noncompliance with other medical treatment and regimen due to unspecified reason: Secondary | ICD-10-CM

## 2020-07-07 NOTE — BH Specialist Note (Signed)
1. Called [phone], no answer 2. Called [phone], pt's father answered and said that his daughter Gwendolynn can be reached at one of the two following numbers (not on Lylah's chart; says that Magdaline is at a "drug rehab, [facility]") 3. [facility] [phone], woman answered who says that Leonda is in class right now, but that she would give Elinora a message to return the call (Call Asher Muir at [phone]) 4. Pt's father says pt can sometimes be reached at her mother's number [phone] (Doralyn previously given this number as emergency contact, number is in her chart)

## 2020-07-09 IMAGING — US US MFM OB FOLLOW-UP
1 series · 13 of 28 positions shown · non-contrast
Comparison: none

[Series 1: us mfm ob follow-up · 13 of 59 slices shown]
[im 3/59]
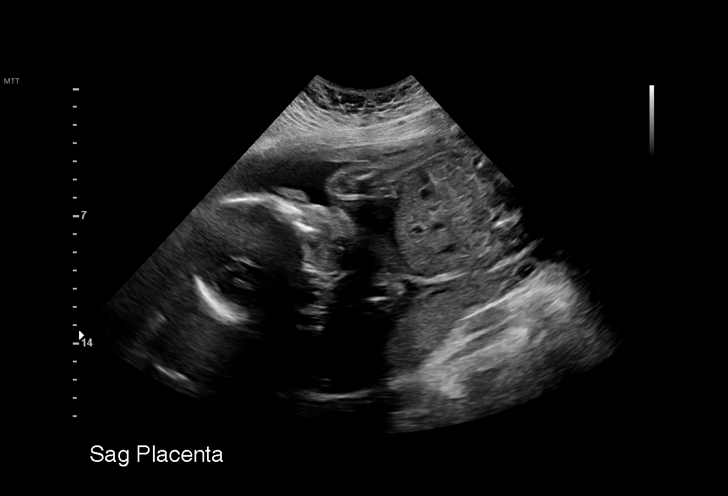
[im 7/59]
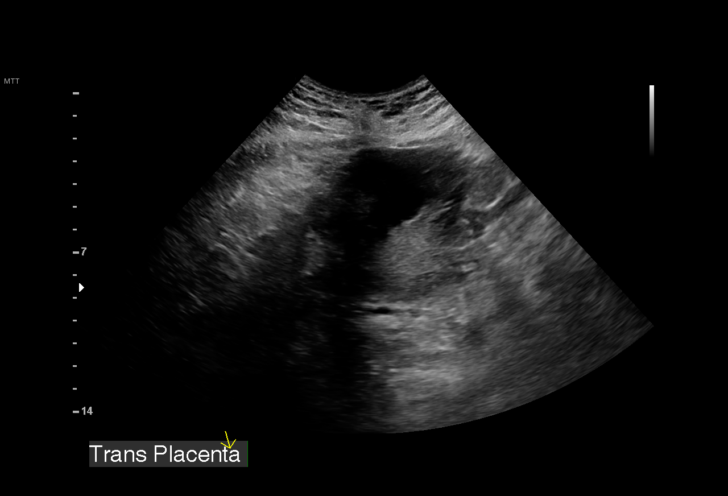
[im 11/59]
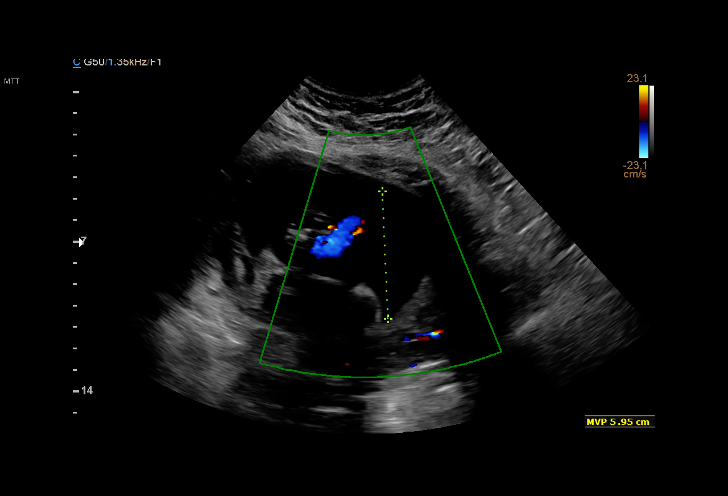
[im 16/59]
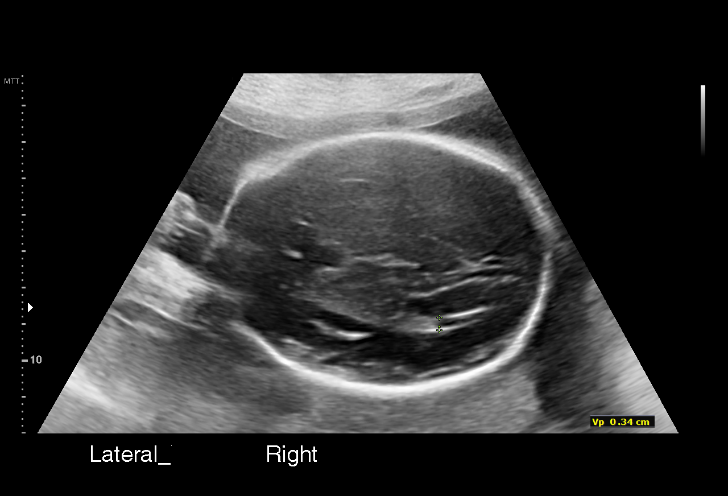
[im 20/59]
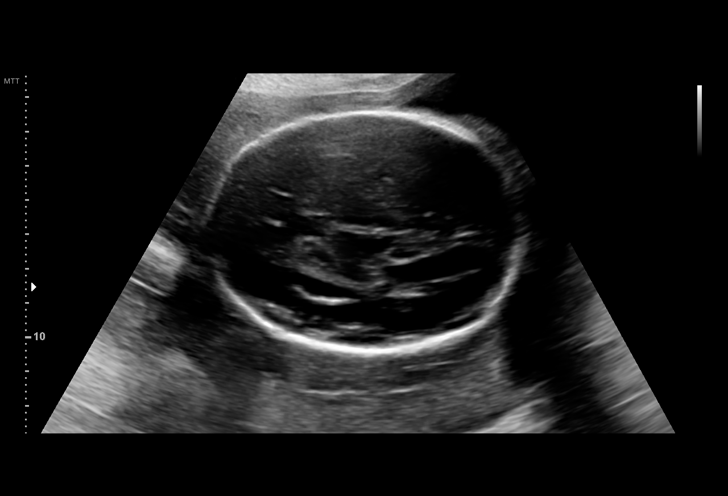
[im 24/59]
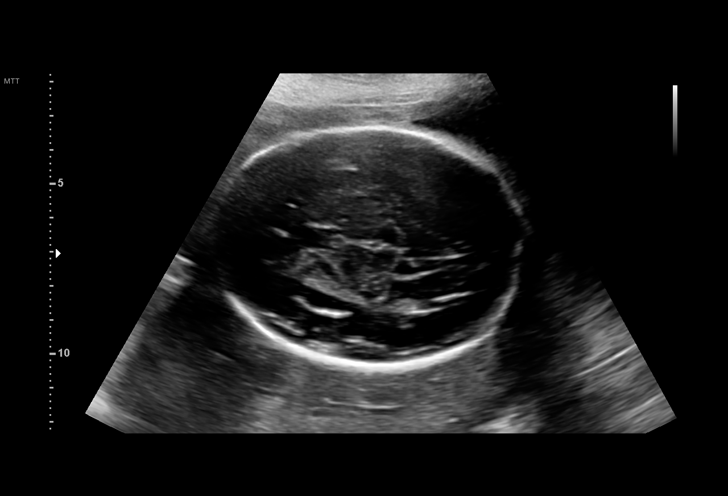
[im 31/59]
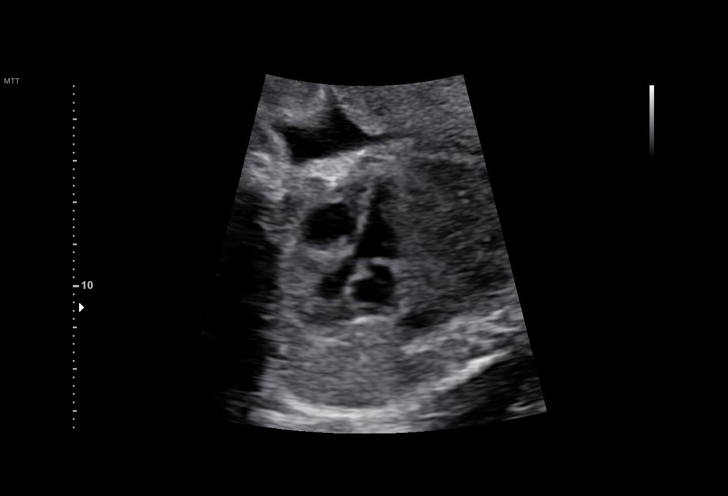
[im 35/59]
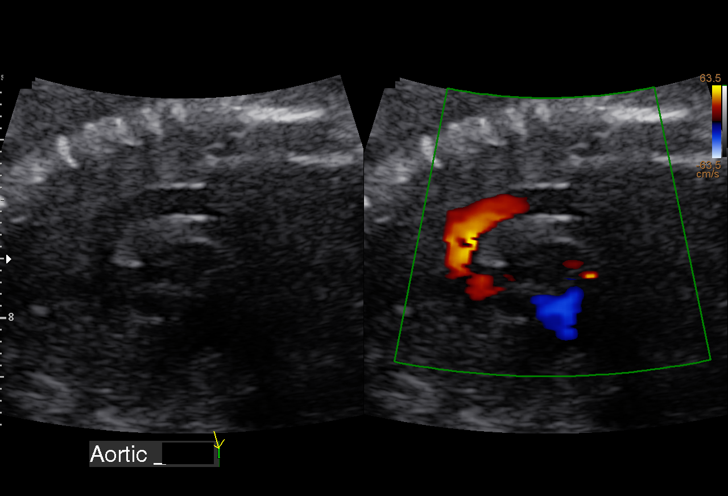
[im 39/59]
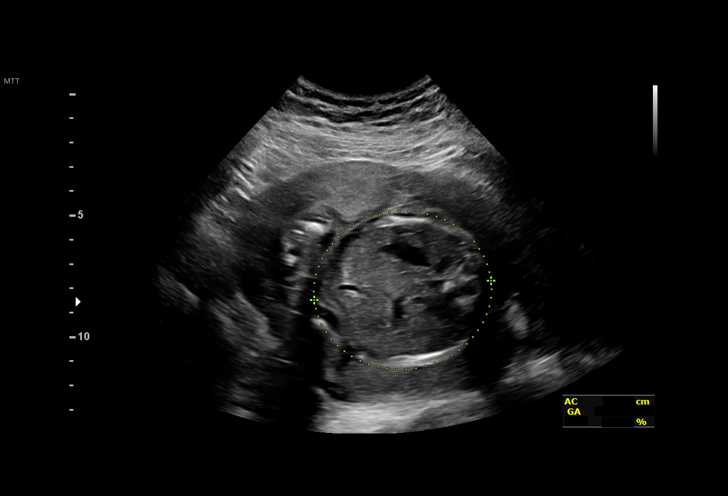
[im 43/59]
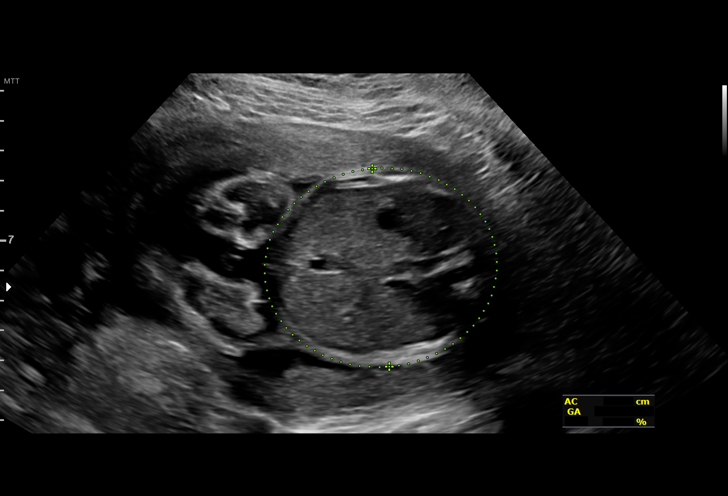
[im 48/59]
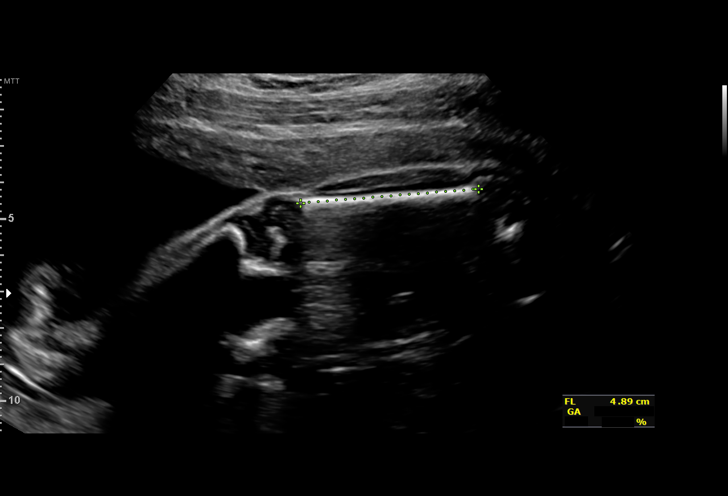
[im 52/59]
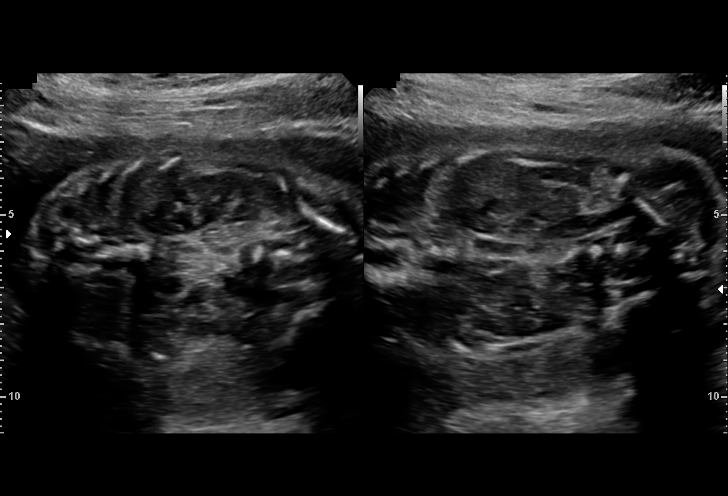
[im 56/59]
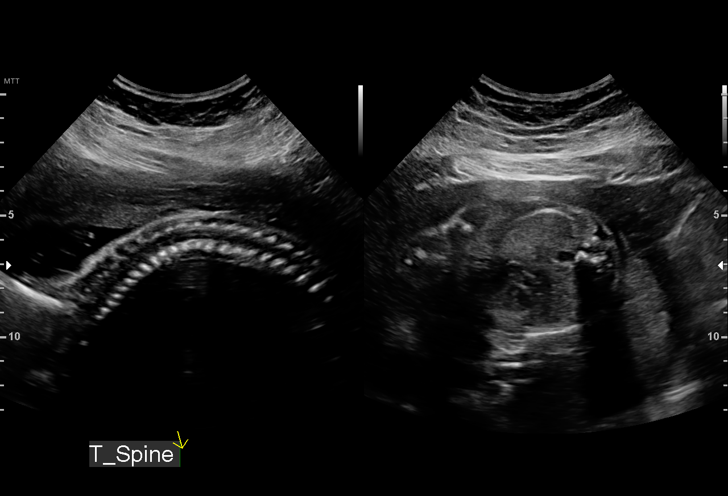

[13 of 28 positions shown; findings below may reference images not displayed]

LUIS NASCIMENTO NP

Indications

 26 weeks gestation of pregnancy
 Tobacco use complicating pregnancy,
 second trimester
 Alcohol use complicating pregnancy, second
 trimester
 Substance abuse affecting pregnancy,
 antepartum
 Obesity complicating pregnancy, second
 trimester
 Cocaine use
 Other mental disorder complicating
 pregnancy, second trimester
 Encounter for other antenatal screening
 follow-up
Vital Signs

                                                Height:        4'11"
Fetal Evaluation

 Num Of Fetuses:         1
 Fetal Heart Rate(bpm):  153
 Cardiac Activity:       Observed
 Presentation:           Breech
 Placenta:               Posterior
 P. Cord Insertion:      Previously Visualized

 Amniotic Fluid
 AFI FV:      Within normal limits

                             Largest Pocket(cm)
                             6
Biometry

 BPD:        70  mm     G. Age:  28w 1d         83  %    CI:        72.83   %    70 - 86
                                                         FL/HC:      18.6   %    18.6 -
 HC:      260.8  mm     G. Age:  28w 3d         77  %    HC/AC:      1.18        1.05 -
 AC:      220.7  mm     G. Age:  26w 4d         35  %    FL/BPD:     69.1   %    71 - 87
 FL:       48.4  mm     G. Age:  26w 2d         22  %    FL/AC:      21.9   %    20 - 24
 CER:      31.5  mm     G. Age:  27w 1d         76  %
 LV:        3.4  mm

 Est. FW:     969  gm      2 lb 2 oz     37  %
OB History

 Gravidity:    3         Term:   1        Prem:   0        SAB:   0
 TOP:          1       Ectopic:  0        Living: 1
Gestational Age

 U/S Today:     27w 3d                                        EDD:   06/15/20
 Best:          26w 5d     Det. By:  Early Ultrasound         EDD:   06/20/20
                                     (11/12/19)
Anatomy

 Cranium:               Appears normal         Aortic Arch:            Appears normal
 Cavum:                 Previously seen        Ductal Arch:            Appears normal
 Ventricles:            Appears normal         Diaphragm:              Appears normal
 Choroid Plexus:        Previously seen        Stomach:                Appears normal, left
                                                                       sided
 Cerebellum:            Appears normal         Abdomen:                Previously seen
 Posterior Fossa:       Previously seen        Abdominal Wall:         Previously seen
 Nuchal Fold:           Not applicable (>20    Cord Vessels:           Appears normal (3
                        wks GA)                                        vessel cord)
 Face:                  Orbits and profile     Kidneys:                Appear normal
                        previously seen
 Lips:                  Previously seen        Bladder:                Appears normal
 Thoracic:              Previously seen        Spine:                  Limited views
                                                                       appear normal
 Heart:                 Appears normal         Upper Extremities:      Previously seen
                        (4CH, axis, and
                        situs)
 RVOT:                  Appears normal         Lower Extremities:      Previously seen
 LVOT:                  Appears normal

 Other:  Parents do not wish to know sex of fetus. Heels visualized.
         Technically difficult due to maternal habitus and fetal position.
Cervix Uterus Adnexa

 Cervix
 Not visualized (advanced GA >03wks)

 Uterus
 No abnormality visualized.

 Right Ovary
 No adnexal mass visualized.
 Left Ovary
 No adnexal mass visualized.

 Cul De Sac
 No free fluid seen.

 Adnexa
 No abnormality visualized.
Impression

 Patient returned for completion of fetal anatomy .Fetal growth
 is appropriate for gestational age .Amniotic fluid is normal
 and good fetal activity is seen .Fetal anatomical survey was
 completed and appears normal.

 History of polysubstance use.
Recommendations

 -An appointment was made for her to return in 6 weeks for
 fetal growth assessment.
                 Kanichery, Anisor Rahaman

## 2020-07-22 ENCOUNTER — Other Ambulatory Visit (HOSPITAL_COMMUNITY)
Admission: RE | Admit: 2020-07-22 | Discharge: 2020-07-22 | Disposition: A | Discharge status: Home / Self Care | Payer: Medicaid Other | Source: Ambulatory Visit | Attending: Obstetrics & Gynecology | Admitting: Obstetrics & Gynecology

## 2020-07-22 ENCOUNTER — Encounter: Payer: Self-pay | Admitting: Obstetrics & Gynecology

## 2020-07-22 ENCOUNTER — Other Ambulatory Visit: Payer: Self-pay

## 2020-07-22 ENCOUNTER — Ambulatory Visit (INDEPENDENT_AMBULATORY_CARE_PROVIDER_SITE_OTHER): Payer: Medicaid Other | Admitting: Obstetrics & Gynecology

## 2020-07-22 VITALS — BP 121/72 | HR 86 | Ht 59.0 in | Wt 190.3 lb

## 2020-07-22 DIAGNOSIS — O9833 Other infections with a predominantly sexual mode of transmission complicating the puerperium: Secondary | ICD-10-CM

## 2020-07-22 DIAGNOSIS — A599 Trichomoniasis, unspecified: Secondary | ICD-10-CM

## 2020-07-22 DIAGNOSIS — N898 Other specified noninflammatory disorders of vagina: Secondary | ICD-10-CM

## 2020-07-22 DIAGNOSIS — N760 Acute vaginitis: Secondary | ICD-10-CM

## 2020-07-22 DIAGNOSIS — O9883 Other maternal infectious and parasitic diseases complicating the puerperium: Secondary | ICD-10-CM

## 2020-07-22 DIAGNOSIS — B9689 Other specified bacterial agents as the cause of diseases classified elsewhere: Secondary | ICD-10-CM

## 2020-07-22 NOTE — Progress Notes (Signed)
     Post Partum Visit Note  Jordan Walton is a 29 y.o. [phone] female who presents for a postpartum visit. She is 4 weeks postpartum following a primary cesarean section.  I have fully reviewed the prenatal and intrapartum course. The delivery was at 39/5 gestational weeks.  Anesthesia: epidural. Postpartum course has been good. Baby is doing well. Baby is feeding by bottle - [employer] Good Start. Bleeding staining only. Bowel function is normal. Bladder function is normal. Patient is not sexually active. Contraception method is IUD. Postpartum depression screening: negative.   The pregnancy intention screening data noted above was reviewed. Potential methods of contraception were discussed. The patient elected to proceed with IUD or IUS.      The following portions of the patient's history were reviewed and updated as appropriate: allergies, current medications, past family history, past medical history, past social history, past surgical history and problem list.  Review of Systems Pertinent items are noted in HPI.    Objective:  Last menstrual period 09/29/2019, unknown if currently breastfeeding.  General:  alert, cooperative and no distress     Lungs: nl effort     Abdomen: soft, non-tender; bowel sounds normal; no masses,  no organomegaly and incsion looks good   Vulva:  normal  Vagina: vagina positive for yellow d/c, very sensitive to exam and did not allow use of speculum  Cervix:  not seen, unable to check strings  Corpus: not examined  Adnexa:  not evaluated  Rectal Exam: Not performed.        Assessment:    5 week postpartum exam. Pap smear not done at today's visit. Ancillary probe for infection collected  Plan:   Essential components of care per ACOG recommendations:  1.  Mood and well being: Patient with negative depression screening today. Reviewed local resources for support.  - Patient does not use tobacco. If using tobacco we discussed reduction and  for recently cessation risk of relapse - hx of drug use? Yes   If yes, discussed support systems  2. Infant care and feeding:  -Patient currently breastmilk feeding? No If breastmilk feeding discussed return to work and pumping. If needed, patient was provided letter for work to allow for every 2-3 hr pumping breaks, and to be granted a private location to express breastmilk and refrigerated area to store breastmilk. Reviewed importance of draining breast regularly to support lactation. -Social determinants of health (SDOH) reviewed in EPIC. No concerns  3. Sexuality, contraception and birth spacing - Patient does not want a pregnancy in the next year.  Desired family size is 2 children.  - Reviewed forms of contraception in tiered fashion. Patient desired IUD today.   - Discussed birth spacing of 18 months  4. Sleep and fatigue -Encouraged family/partner/community support of 4 hrs of uninterrupted sleep to help with mood and fatigue  5. Physical Recovery  - Discussed patients delivery - Patient has urinary incontinence? No - Patient is safe to resume physical and sexual activity  6.  Health Maintenance - Last pap smear done overdue and was normal with negative HPV. Need pap still  7. Chronic Disease - PCP follow up  Henrietta Dine, CMA [facility], [facility]  Adam Phenix, MD

## 2020-07-22 NOTE — Patient Instructions (Signed)
Levonorgestrel intrauterine device (IUD) What is this medicine? LEVONORGESTREL IUD (LEE voe nor jes trel) is a contraceptive (birth control) device. The device is placed inside the uterus by a healthcare professional. It is used to prevent pregnancy. This device can also be used to treat heavy bleeding that occurs during your period. This medicine may be used for other purposes; ask your health care provider or pharmacist if you have questions. COMMON BRAND NAME(S): Kyleena, LILETTA, Mirena, Skyla What should I tell my health care provider before I take this medicine? They need to know if you have any of these conditions:  abnormal Pap smear  cancer of the breast, uterus, or cervix  diabetes  endometritis  genital or pelvic infection now or in the past  have more than one sexual partner or your partner has more than one partner  heart disease  history of an ectopic or tubal pregnancy  immune system problems  IUD in place  liver disease or tumor  problems with blood clots or take blood-thinners  seizures  use intravenous drugs  uterus of unusual shape  vaginal bleeding that has not been explained  an unusual or allergic reaction to levonorgestrel, other hormones, silicone, or polyethylene, medicines, foods, dyes, or preservatives  pregnant or trying to get pregnant  breast-feeding How should I use this medicine? This device is placed inside the uterus by a health care professional. Talk to your pediatrician regarding the use of this medicine in children. Special care may be needed. Overdosage: If you think you have taken too much of this medicine contact a poison control center or emergency room at once. NOTE: This medicine is only for you. Do not share this medicine with others. What if I miss a dose? This does not apply. Depending on the brand of device you have inserted, the device will need to be replaced every 3 to 6 years if you wish to continue using this type  of birth control. What may interact with this medicine? Do not take this medicine with any of the following medications:  amprenavir  bosentan  fosamprenavir This medicine may also interact with the following medications:  aprepitant  armodafinil  barbiturate medicines for inducing sleep or treating seizures  bexarotene  boceprevir  griseofulvin  medicines to treat seizures like carbamazepine, ethotoin, felbamate, oxcarbazepine, phenytoin, topiramate  modafinil  pioglitazone  rifabutin  rifampin  rifapentine  some medicines to treat HIV infection like atazanavir, efavirenz, indinavir, lopinavir, nelfinavir, tipranavir, ritonavir  St. John's wort  warfarin This list may not describe all possible interactions. Give your health care provider a list of all the medicines, herbs, non-prescription drugs, or dietary supplements you use. Also tell them if you smoke, drink alcohol, or use illegal drugs. Some items may interact with your medicine. What should I watch for while using this medicine? Visit your doctor or health care professional for regular check ups. See your doctor if you or your partner has sexual contact with others, becomes HIV positive, or gets a sexual transmitted disease. This product does not protect you against HIV infection (AIDS) or other sexually transmitted diseases. You can check the placement of the IUD yourself by reaching up to the top of your vagina with clean fingers to feel the threads. Do not pull on the threads. It is a good habit to check placement after each menstrual period. Call your doctor right away if you feel more of the IUD than just the threads or if you cannot feel the threads at   all. The IUD may come out by itself. You may become pregnant if the device comes out. If you notice that the IUD has come out use a backup birth control method like condoms and call your health care provider. Using tampons will not change the position of the  IUD and are okay to use during your period. This IUD can be safely scanned with magnetic resonance imaging (MRI) only under specific conditions. Before you have an MRI, tell your healthcare provider that you have an IUD in place, and which type of IUD you have in place. What side effects may I notice from receiving this medicine? Side effects that you should report to your doctor or health care professional as soon as possible:  allergic reactions like skin rash, itching or hives, swelling of the face, lips, or tongue  fever, flu-like symptoms  genital sores  high blood pressure  no menstrual period for 6 weeks during use  pain, swelling, warmth in the leg  pelvic pain or tenderness  severe or sudden headache  signs of pregnancy  stomach cramping  sudden shortness of breath  trouble with balance, talking, or walking  unusual vaginal bleeding, discharge  yellowing of the eyes or skin Side effects that usually do not require medical attention (report to your doctor or health care professional if they continue or are bothersome):  acne  breast pain  change in sex drive or performance  changes in weight  cramping, dizziness, or faintness while the device is being inserted  headache  irregular menstrual bleeding within first 3 to 6 months of use  nausea This list may not describe all possible side effects. Call your doctor for medical advice about side effects. You may report side effects to FDA at 1-800-FDA-1088. Where should I keep my medicine? This does not apply. NOTE: This sheet is a summary. It may not cover all possible information. If you have questions about this medicine, talk to your doctor, pharmacist, or health care provider.  2020 Elsevier/Gold Standard (2018-07-31 13:22:01)  

## 2020-07-23 LAB — CERVICOVAGINAL ANCILLARY ONLY
Bacterial Vaginitis (gardnerella): NEGATIVE
Candida Glabrata: NEGATIVE
Candida Vaginitis: POSITIVE — AB
Chlamydia: NEGATIVE
Comment: NEGATIVE
Comment: NEGATIVE
Comment: NEGATIVE
Comment: NEGATIVE
Comment: NEGATIVE
Comment: NORMAL
Neisseria Gonorrhea: NEGATIVE
Trichomonas: POSITIVE — AB

## 2020-07-24 ENCOUNTER — Other Ambulatory Visit (HOSPITAL_COMMUNITY): Payer: Self-pay | Admitting: Obstetrics & Gynecology

## 2020-07-24 ENCOUNTER — Telehealth: Payer: Self-pay | Admitting: *Deleted

## 2020-07-24 DIAGNOSIS — N898 Other specified noninflammatory disorders of vagina: Secondary | ICD-10-CM

## 2020-07-24 MED ORDER — METRONIDAZOLE 500 MG PO TABS: ORAL_TABLET | ORAL | 0 refills | Status: DC

## 2020-07-24 MED ORDER — FLUCONAZOLE 150 MG PO TABS: 150.0000 mg | ORAL_TABLET | Freq: Once | ORAL | 0 refills | Status: AC

## 2020-07-24 NOTE — Telephone Encounter (Signed)
Received a voice message from a Jordan Walton stating she is with a recovery program called [facility] . States client was seen by [place] yesterday and a prescription was called into pharmacy not in their jurisdiction Wants to know if it can be sent to [facility] on [address] Soren Lazarz,RN

## 2020-07-27 MED ORDER — METRONIDAZOLE 500 MG PO TABS: ORAL_TABLET | ORAL | 0 refills | Status: DC

## 2020-07-27 NOTE — Telephone Encounter (Signed)
I called Jordan Walton to confirm she wants her prescription sent to [facility] on [address] and she confirmed this. I sent in new RX and called [facility] to cancel previous prescription. Kindred Reidinger,RN

## 2020-12-11 ENCOUNTER — Emergency Department (HOSPITAL_COMMUNITY)
Admission: EM | Admit: 2020-12-11 | Discharge: 2020-12-11 | Disposition: A | Discharge status: Home / Self Care | Payer: Medicaid Other | Attending: Emergency Medicine | Admitting: Emergency Medicine

## 2020-12-11 ENCOUNTER — Other Ambulatory Visit: Payer: Self-pay

## 2020-12-11 ENCOUNTER — Encounter (HOSPITAL_COMMUNITY): Payer: Self-pay

## 2020-12-11 DIAGNOSIS — H6691 Otitis media, unspecified, right ear: Secondary | ICD-10-CM | POA: Diagnosis not present

## 2020-12-11 DIAGNOSIS — Z79899 Other long term (current) drug therapy: Secondary | ICD-10-CM | POA: Insufficient documentation

## 2020-12-11 DIAGNOSIS — H9201 Otalgia, right ear: Secondary | ICD-10-CM | POA: Diagnosis present

## 2020-12-11 DIAGNOSIS — J302 Other seasonal allergic rhinitis: Secondary | ICD-10-CM | POA: Insufficient documentation

## 2020-12-11 DIAGNOSIS — H6690 Otitis media, unspecified, unspecified ear: Secondary | ICD-10-CM

## 2020-12-11 DIAGNOSIS — Z7982 Long term (current) use of aspirin: Secondary | ICD-10-CM | POA: Diagnosis not present

## 2020-12-11 DIAGNOSIS — Z87891 Personal history of nicotine dependence: Secondary | ICD-10-CM | POA: Insufficient documentation

## 2020-12-11 DIAGNOSIS — Z8759 Personal history of other complications of pregnancy, childbirth and the puerperium: Secondary | ICD-10-CM | POA: Diagnosis not present

## 2020-12-11 MED ORDER — FLUTICASONE PROPIONATE 50 MCG/ACT NA SUSP: 1.0000 | Freq: Every day | NASAL | 2 refills | Status: DC

## 2020-12-11 MED ORDER — LEVOCETIRIZINE DIHYDROCHLORIDE 5 MG PO TABS: 5.0000 mg | ORAL_TABLET | Freq: Every evening | ORAL | 2 refills | Status: DC

## 2020-12-11 MED ORDER — KETOTIFEN FUMARATE 0.025 % OP SOLN: 1.0000 [drp] | Freq: Two times a day (BID) | OPHTHALMIC | 0 refills | Status: DC

## 2020-12-11 MED ORDER — METHYLPREDNISOLONE 4 MG PO TBPK: ORAL_TABLET | ORAL | 0 refills | Status: DC

## 2020-12-11 MED ORDER — AMOXICILLIN 500 MG PO CAPS: 500.0000 mg | ORAL_CAPSULE | Freq: Three times a day (TID) | ORAL | 0 refills | Status: DC

## 2020-12-11 MED ORDER — CELECOXIB 200 MG PO CAPS: 200.0000 mg | ORAL_CAPSULE | Freq: Two times a day (BID) | ORAL | 0 refills | Status: DC

## 2020-12-11 NOTE — Discharge Instructions (Signed)
Get help right away if: You have severe pain that is not controlled with medicine. You have swelling, redness, or pain around your ear. You have stiffness in your neck. A part of your face is not moving (paralyzed). The bone behind your ear (mastoid) is tender when you touch it. You develop a severe headache.

## 2020-12-11 NOTE — ED Provider Notes (Signed)
[facility]-EMERGENCY DEPT Provider Note   CSN: [id number] Arrival date & time: 12/11/20  1748     History Chief Complaint  Patient presents with  . Headache  . Neck Pain  . Otalgia    Jordan Walton is a 30 y.o. female who presents emergency department chief complaint of right ear pain.  Patient has been having symptoms of itchy and runny nose, sneezing, facial pressure, cough,itchy eyes, wheezing especially at night.  She denies a previous known history of seasonal allergies however it seems that every spring she has similar symptoms.  Over the past 24 hours she has had throbbing and ear fullness on the right side.  She denies fevers, chills, neck stiffness, changes in vision, photophobia or phonophobia.  HPI     Past Medical History:  Diagnosis Date  . Crack cocaine use   . Herpes simplex virus (HSV) infection   . Pregnancy induced hypertension     Patient Active Problem List   Diagnosis Date Noted  . Postpartum care following cesarean delivery 06/18/2020  . PROM (premature rupture of membranes) 06/16/2020  . Alpha thalassemia silent carrier 03/26/2020  . History of gestational hypertension 02/20/2020  . Supervision of high risk pregnancy, antepartum 02/06/2020  . Crack cocaine use   . Obesity in pregnancy   . Psychosocial stressors   . Major depressive disorder, recurrent episode (HCC) 11/14/2019  . Schizoaffective disorder, bipolar type (HCC) 11/09/2019  . Substance induced mood disorder (HCC) 04/11/2019  . Cocaine abuse (HCC) 04/13/2017  . Cocaine abuse with cocaine-induced mood disorder (HCC) 04/13/2017  . Bipolar I disorder, most recent episode depressed, severe without psychotic features (HCC) 08/01/2016  . Bipolar disorder, current episode depressed, mild or moderate severity, unspecified (HCC) 07/31/2016  . Polysubstance abuse (HCC) 07/31/2016  . History of suicidal ideation 06/20/2016  . Tobacco dependence 05/02/2016  . Major  depressive disorder, recurrent severe without psychotic features (HCC) 02/25/2016  . Uncomplicated alcohol dependence (HCC) 02/25/2016  . History of herpes genitalis 03/27/2008    Past Surgical History:  Procedure Laterality Date  . CESAREAN SECTION N/A 06/18/2020   Procedure: CESAREAN SECTION;  Surgeon: Tereso Newcomer, MD;  Location: [facility] LD ORS;  Service: Obstetrics;  Laterality: N/A;     OB History    Gravida  3   Para  2   Term  2   Preterm      AB  1   Living  2     SAB      IAB  1   Ectopic      Multiple  0   Live Births  2           Family History  Problem Relation Age of Onset  . Hypertension Father     Social History   Tobacco Use  . Smoking status: Former Smoker    Packs/day: 0.25    Types: Cigarettes  . Smokeless tobacco: Never Used  Vaping Use  . Vaping Use: Never used  Substance Use Topics  . Alcohol use: Not Currently    Alcohol/week: 6.0 - 12.0 standard drinks    Types: 6 - 12 Cans of beer per week    Comment: last was 11/04/19  . Drug use: Yes    Frequency: 7.0 times per week    Types: "Crack" cocaine, Cocaine    Home Medications Prior to Admission medications   Medication Sig Start Date End Date Taking? Authorizing Provider  aspirin EC 81 MG tablet Take 1  tablet (81 mg total) by mouth daily. Take after 12 weeks for prevention of preeclampsia later in pregnancy Patient not taking: Reported on 02/20/2020 02/20/20   Conan Bowens, MD  Cholecalciferol (VITAMIN D3) 50 MCG (2000 UT) capsule Take 2,000 Units by mouth daily. Patient not taking: Reported on 07/22/2020 01/20/20   [provider]  diphenhydrAMINE HCl (BENADRYL PO) Take by mouth. Patient not taking: Reported on 07/22/2020    [provider]  folic acid (FOLVITE) 1 MG tablet Take 1 mg by mouth daily. Patient not taking: Reported on 07/22/2020 01/20/20   [provider]  hydrOXYzine (ATARAX/VISTARIL) 25 MG tablet Take 1 tablet (25 mg total) by mouth  every 6 (six) hours as needed for anxiety. Patient not taking: Reported on 02/20/2020 11/18/19   Aldean Baker, NP  ibuprofen (ADVIL) 800 MG tablet Take 1 tablet (800 mg total) by mouth every 8 (eight) hours. Patient not taking: Reported on 07/22/2020 06/21/20   Lazaro Arms, MD  Loratadine (CLARITIN PO) Take by mouth. Patient not taking: Reported on 07/22/2020    [provider]  metroNIDAZOLE (FLAGYL) 500 MG tablet Take two tablets by mouth twice a day, for one day.  Or you can take all four tablets at once if you can tolerate it. 07/27/20   Adam Phenix, MD  NIFEdipine (ADALAT CC) 30 MG 24 hr tablet Take 1 tablet (30 mg total) by mouth daily. Patient not taking: Reported on 07/22/2020 06/21/20   Lazaro Arms, MD  oxyCODONE (OXY IR/ROXICODONE) 5 MG immediate release tablet Take 1-2 tablets (5-10 mg total) by mouth every 4 (four) hours as needed for moderate pain. Patient not taking: Reported on 07/22/2020 06/21/20   Lazaro Arms, MD  Prenatal Vit-Fe Fumarate-FA (PRENATAL MULTIVITAMIN) TABS tablet Take 1 tablet by mouth daily. 11/19/19   Aldean Baker, NP  sertraline (ZOLOFT) 25 MG tablet Take 3 tablets (75 mg total) by mouth daily. Patient not taking: Reported on 07/22/2020 11/19/19   Aldean Baker, NP  SV MELATONIN 3 MG TBDP DISSOLVE 1 TABLET BY MOUTH ONCE DAILY AT BEDTIME AS NEEDED FOR SLEEP Patient not taking: Reported on 07/22/2020 12/18/19   [provider]    Allergies    Patient has no known allergies.  Review of Systems   Review of Systems Ten systems reviewed and are negative for acute change, except as noted in the HPI.   Physical Exam Updated Vital Signs BP (!) 148/84 (BP Location: Right Arm)   Pulse 100   Temp 98 F (36.7 C) (Oral)   Resp 15   Ht 4\' 11"  (1.499 m)   Wt 100.2 kg   LMP 12/08/2020   SpO2 96%   BMI 44.60 kg/m   Physical Exam Vitals and nursing note reviewed.  Constitutional:      General: She is not in acute distress.     Appearance: She is well-developed. She is not diaphoretic.  HENT:     Head: Normocephalic and atraumatic.  Eyes:     General: No scleral icterus.    Extraocular Movements: Extraocular movements intact.     Conjunctiva/sclera: Conjunctivae normal.     Pupils: Pupils are equal, round, and reactive to light.  Cardiovascular:     Rate and Rhythm: Normal rate and regular rhythm.     Heart sounds: Normal heart sounds. No murmur heard. No friction rub. No gallop.   Pulmonary:     Effort: Pulmonary effort is normal. No respiratory distress.  Breath sounds: Normal breath sounds.  Abdominal:     General: Bowel sounds are normal. There is no distension.     Palpations: Abdomen is soft. There is no mass.     Tenderness: There is no abdominal tenderness. There is no guarding.  Musculoskeletal:     Cervical back: Normal range of motion.  Skin:    General: Skin is warm and dry.  Neurological:     Mental Status: She is alert and oriented to person, place, and time.  Psychiatric:        Behavior: Behavior normal.     ED Results / Procedures / Treatments   Labs (all labs ordered are listed, but only abnormal results are displayed) Labs Reviewed  CBG MONITORING, ED    EKG None  Radiology No results found.  Procedures Procedures   Medications Ordered in ED Medications - No data to display  ED Course  I have reviewed the triage vital signs and the nursing notes.  Pertinent labs & imaging results that were available during my care of the patient were reviewed by me and considered in my medical decision making (see chart for details).    MDM Rules/Calculators/A&P                          30 year old female here with complaint of right ear pain.  She does have acute otitis media, no evidence of mastoiditis or meningitis.  Patient also appears to have seasonal allergies.  Will treat with multiple medications addressing nasal, eye and systemic allergy symptoms.  Also given  anti-inflammatory medications for pain.  She appears otherwise appropriate for discharge at this time Final Clinical Impression(s) / ED Diagnoses Final diagnoses:  None    Rx / DC Orders ED Discharge Orders    None       Arthor Captain, PA-C 12/11/20 2352    Rolan Bucco, MD 12/11/20 2356

## 2020-12-11 NOTE — ED Triage Notes (Signed)
Patient c/o bilateral ear pain, but R>L, headache, and posterior neck pain since this AM.

## 2020-12-11 NOTE — ED Notes (Signed)
Patient refused CBG. PA made aware.

## 2021-02-19 ENCOUNTER — Encounter (HOSPITAL_COMMUNITY): Payer: Self-pay

## 2021-02-19 ENCOUNTER — Other Ambulatory Visit (HOSPITAL_COMMUNITY): Payer: Self-pay

## 2021-02-19 ENCOUNTER — Emergency Department (HOSPITAL_COMMUNITY)
Admission: EM | Admit: 2021-02-19 | Discharge: 2021-02-19 | Disposition: A | Discharge status: Other Acute Inpatient Hospital | Payer: Medicaid Other | Attending: Emergency Medicine | Admitting: Emergency Medicine

## 2021-02-19 ENCOUNTER — Other Ambulatory Visit: Payer: Self-pay

## 2021-02-19 ENCOUNTER — Ambulatory Visit (HOSPITAL_COMMUNITY)
Admission: EM | Admit: 2021-02-19 | Discharge: 2021-02-19 | Disposition: A | Discharge status: Home / Self Care | Payer: Medicaid Other | Attending: Nurse Practitioner | Admitting: Nurse Practitioner

## 2021-02-19 DIAGNOSIS — F332 Major depressive disorder, recurrent severe without psychotic features: Secondary | ICD-10-CM | POA: Insufficient documentation

## 2021-02-19 DIAGNOSIS — R45851 Suicidal ideations: Secondary | ICD-10-CM | POA: Insufficient documentation

## 2021-02-19 DIAGNOSIS — Z87891 Personal history of nicotine dependence: Secondary | ICD-10-CM | POA: Diagnosis not present

## 2021-02-19 DIAGNOSIS — Z5900 Homelessness unspecified: Secondary | ICD-10-CM | POA: Insufficient documentation

## 2021-02-19 DIAGNOSIS — F1420 Cocaine dependence, uncomplicated: Secondary | ICD-10-CM | POA: Insufficient documentation

## 2021-02-19 DIAGNOSIS — F1994 Other psychoactive substance use, unspecified with psychoactive substance-induced mood disorder: Secondary | ICD-10-CM

## 2021-02-19 DIAGNOSIS — F1410 Cocaine abuse, uncomplicated: Secondary | ICD-10-CM

## 2021-02-19 DIAGNOSIS — Z20822 Contact with and (suspected) exposure to covid-19: Secondary | ICD-10-CM | POA: Insufficient documentation

## 2021-02-19 DIAGNOSIS — F1020 Alcohol dependence, uncomplicated: Secondary | ICD-10-CM

## 2021-02-19 DIAGNOSIS — F1010 Alcohol abuse, uncomplicated: Secondary | ICD-10-CM

## 2021-02-19 LAB — COMPREHENSIVE METABOLIC PANEL
ALT: 18 U/L (ref 0–44)
AST: 20 U/L (ref 15–41)
Albumin: 3.7 g/dL (ref 3.5–5.0)
Alkaline Phosphatase: 61 U/L (ref 38–126)
Anion gap: 9 (ref 5–15)
BUN: 10 mg/dL (ref 6–20)
CO2: 22 mmol/L (ref 22–32)
Calcium: 9 mg/dL (ref 8.9–10.3)
Chloride: 104 mmol/L (ref 98–111)
Creatinine, Ser: 0.88 mg/dL (ref 0.44–1.00)
GFR, Estimated: >60 mL/min (ref >60)
Glucose, Bld: 118 mg/dL — ABNORMAL HIGH (ref 70–99)
Potassium: 4.1 mmol/L (ref 3.5–5.1)
Sodium: 135 mmol/L (ref 135–145)
Total Bilirubin: 0.4 mg/dL (ref 0.3–1.2)
Total Protein: 7.3 g/dL (ref 6.5–8.1)

## 2021-02-19 LAB — RESP PANEL BY RT-PCR (FLU A&B, COVID) ARPGX2
Influenza A by PCR: NEGATIVE
Influenza B by PCR: NEGATIVE
SARS Coronavirus 2 by RT PCR: NEGATIVE

## 2021-02-19 LAB — CBC WITH DIFFERENTIAL/PLATELET
Abs Immature Granulocytes: 0.03 10*3/uL (ref 0.00–0.07)
Basophils Absolute: 0.1 10*3/uL (ref 0.0–0.1)
Basophils Relative: 1 %
Eosinophils Absolute: 0.3 10*3/uL (ref 0.0–0.5)
Eosinophils Relative: 2 %
HCT: 41.4 % (ref 36.0–46.0)
Hemoglobin: 13.3 g/dL (ref 12.0–15.0)
Immature Granulocytes: 0 %
Lymphocytes Relative: 38 %
Lymphs Abs: 4.3 10*3/uL — ABNORMAL HIGH (ref 0.7–4.0)
MCH: 27.7 pg (ref 26.0–34.0)
MCHC: 32.1 g/dL (ref 30.0–36.0)
MCV: 86.3 fL (ref 80.0–100.0)
Monocytes Absolute: 0.4 10*3/uL (ref 0.1–1.0)
Monocytes Relative: 3 %
Neutro Abs: 6.4 10*3/uL (ref 1.7–7.7)
Neutrophils Relative %: 56 %
Platelets: 437 10*3/uL — ABNORMAL HIGH (ref 150–400)
RBC: 4.8 MIL/uL (ref 3.87–5.11)
RDW: 15.3 % (ref 11.5–15.5)
WBC: 11.5 10*3/uL — ABNORMAL HIGH (ref 4.0–10.5)
nRBC: 0 % (ref 0.0–0.2)

## 2021-02-19 LAB — POCT URINE DRUG SCREEN - MANUAL ENTRY (I-SCREEN)
POC Amphetamine UR: NOT DETECTED
POC Buprenorphine (BUP): NOT DETECTED
POC Cocaine UR: POSITIVE — AB
POC Marijuana UR: NOT DETECTED
POC Methadone UR: NOT DETECTED
POC Methamphetamine UR: NOT DETECTED
POC Morphine: NOT DETECTED
POC Oxazepam (BZO): NOT DETECTED
POC Oxycodone UR: NOT DETECTED
POC Secobarbital (BAR): NOT DETECTED

## 2021-02-19 LAB — LIPID PANEL
Cholesterol: 175 mg/dL (ref 0–200)
HDL: 71 mg/dL (ref >40)
LDL Cholesterol: 67 mg/dL (ref 0–99)
Total CHOL/HDL Ratio: 2.5 RATIO
Triglycerides: 184 mg/dL — ABNORMAL HIGH (ref <150)
VLDL: 37 mg/dL (ref 0–40)

## 2021-02-19 LAB — HEMOGLOBIN A1C
Hgb A1c MFr Bld: 6.4 % — ABNORMAL HIGH (ref 4.8–5.6)
Mean Plasma Glucose: 136.98 mg/dL

## 2021-02-19 LAB — POCT PREGNANCY, URINE: Preg Test, Ur: NEGATIVE

## 2021-02-19 LAB — TSH: TSH: 1.559 u[IU]/mL (ref 0.350–4.500)

## 2021-02-19 LAB — POC SARS CORONAVIRUS 2 AG -  ED: SARS Coronavirus 2 Ag: NEGATIVE

## 2021-02-19 LAB — ETHANOL: Alcohol, Ethyl (B): 45 mg/dL — ABNORMAL HIGH (ref <10)

## 2021-02-19 MED ORDER — THIAMINE HCL 100 MG PO TABS
100.0000 mg | ORAL_TABLET | Freq: Every day | ORAL | Status: DC
  Administered 2021-02-19: 100 mg via ORAL
  Filled 2021-02-19: qty 1

## 2021-02-19 MED ORDER — LORAZEPAM 2 MG/ML IJ SOLN: 0.0000 mg | Freq: Two times a day (BID) | INTRAMUSCULAR | Status: DC

## 2021-02-19 MED ORDER — ONDANSETRON HCL 4 MG PO TABS: 4.0000 mg | ORAL_TABLET | Freq: Three times a day (TID) | ORAL | Status: DC | PRN

## 2021-02-19 MED ORDER — ONDANSETRON 4 MG PO TBDP: 4.0000 mg | ORAL_TABLET | Freq: Four times a day (QID) | ORAL | Status: DC | PRN

## 2021-02-19 MED ORDER — LOPERAMIDE HCL 2 MG PO CAPS: 2.0000 mg | ORAL_CAPSULE | ORAL | Status: DC | PRN

## 2021-02-19 MED ORDER — GABAPENTIN 300 MG PO CAPS: 300.0000 mg | ORAL_CAPSULE | Freq: Three times a day (TID) | ORAL | Status: DC

## 2021-02-19 MED ORDER — BUPROPION HCL ER (XL) 150 MG PO TB24: 150.0000 mg | ORAL_TABLET | Freq: Every day | ORAL | Status: DC

## 2021-02-19 MED ORDER — HYDROXYZINE HCL 25 MG PO TABS
25.0000 mg | ORAL_TABLET | Freq: Four times a day (QID) | ORAL | Status: DC | PRN
  Filled 2021-02-19: qty 1

## 2021-02-19 MED ORDER — GABAPENTIN 600 MG PO TABS
300.0000 mg | ORAL_TABLET | Freq: Three times a day (TID) | ORAL | Status: DC
Start: 2021-02-19 — End: 2021-02-19

## 2021-02-19 MED ORDER — MAGNESIUM HYDROXIDE 400 MG/5ML PO SUSP: 30.0000 mL | Freq: Every day | ORAL | Status: DC | PRN

## 2021-02-19 MED ORDER — ACETAMINOPHEN 325 MG PO TABS: 650.0000 mg | ORAL_TABLET | ORAL | Status: DC | PRN

## 2021-02-19 MED ORDER — LORAZEPAM 1 MG PO TABS: 1.0000 mg | ORAL_TABLET | Freq: Four times a day (QID) | ORAL | Status: DC | PRN

## 2021-02-19 MED ORDER — LORAZEPAM 1 MG PO TABS
1.0000 mg | ORAL_TABLET | Freq: Once | ORAL | Status: DC
  Filled 2021-02-19: qty 1

## 2021-02-19 MED ORDER — ALUM & MAG HYDROXIDE-SIMETH 200-200-20 MG/5ML PO SUSP: 30.0000 mL | ORAL | Status: DC | PRN

## 2021-02-19 MED ORDER — GABAPENTIN 300 MG PO CAPS
300.0000 mg | ORAL_CAPSULE | Freq: Once | ORAL | Status: DC
  Filled 2021-02-19: qty 1

## 2021-02-19 MED ORDER — BUPROPION HCL ER (XL) 150 MG PO TB24
150.0000 mg | ORAL_TABLET | Freq: Every day | ORAL | 0 refills | Status: DC
  Filled 2021-02-19: qty 30, 30d supply, fill #0

## 2021-02-19 MED ORDER — THIAMINE HCL 100 MG/ML IJ SOLN: 100.0000 mg | Freq: Every day | INTRAMUSCULAR | Status: DC

## 2021-02-19 MED ORDER — LORAZEPAM 1 MG PO TABS
0.0000 mg | ORAL_TABLET | Freq: Two times a day (BID) | ORAL | Status: DC
Start: 2021-02-21 — End: 2021-02-20

## 2021-02-19 MED ORDER — ACETAMINOPHEN 325 MG PO TABS: 650.0000 mg | ORAL_TABLET | Freq: Four times a day (QID) | ORAL | Status: DC | PRN

## 2021-02-19 MED ORDER — LORAZEPAM 1 MG PO TABS: 0.0000 mg | ORAL_TABLET | Freq: Four times a day (QID) | ORAL | Status: DC

## 2021-02-19 MED ORDER — LORAZEPAM 2 MG/ML IJ SOLN: 0.0000 mg | Freq: Four times a day (QID) | INTRAMUSCULAR | Status: DC

## 2021-02-19 NOTE — ED Notes (Signed)
Pt voluntary to [facility].  Pt accepted to [facility] [place], [address]. Accepting provider Dr Forrestine Him.  Report given by RN.

## 2021-02-19 NOTE — ED Notes (Addendum)
Patient stated that she has been in the program at [facility] in the past and is in contact with Miss Bonnie-Owner). Stated that she needs to have her medication regulated, needs healthy food, sleep and prayer.

## 2021-02-19 NOTE — ED Notes (Signed)
Gave patient a sandwich, crackers, peanut butter, water, sprite
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
## 2021-02-19 NOTE — ED Notes (Signed)
Patient was dressed out in purple scrubs.

## 2021-02-19 NOTE — ED Notes (Addendum)
TWO lockers - [facility] and [employer].
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
## 2021-02-19 NOTE — ED Notes (Signed)
Pt given crackers, water, and tissues.

## 2021-02-19 NOTE — ED Notes (Signed)
Patient crying on arrival.

## 2021-02-19 NOTE — BH Assessment (Signed)
BHH Assessment Progress Note  Per Nelly Rout, MD, this voluntary pt requires psychiatric hospitalization at this time.  Pt has been referred to [facility], but at Dr Remus Blake direction, this writer has also pursued outside placement.  The following facilities have been contacted to seek placement for this pt, with results as noted:  Beds available, information sent, decision pending: [facility] Tish Frederickson [facility]  At capacity: [facility]   If this voluntary pt is accepted to a facility, please discuss disposition with pt to be sure that she agrees to the plan.  If a facility agrees to accept pt and the plan changes in any way please call the facility to inform them of the change.  Final disposition is pending as of this writing.  Doylene Canning, [place] Behavioral Health Coordinator [phone]

## 2021-02-19 NOTE — ED Provider Notes (Signed)
BH team indicates pt accepted to [facility], Dr Forrestine Him.   Pt is alert, content, no distress. Vitals are normal.   Pt currently appears stable for transfer/transport.      Cathren Laine, MD 02/19/21 [phone]

## 2021-02-19 NOTE — ED Notes (Addendum)
Pt was in room #135 with Clinical research associate and RN Joanie Coddington, she was asked to let us do a skin assessment and she started cursing@RN  Latricia. Writer explained to her that we have to do a skin assessment and check for any tattoos, cuts or bruises we treat pt as a whole not just for what brought them in to [place]. She kept saying she going through a lot and we treating her like an animal. I explained to her she is not an animal and we just want to make sure she was ok. We proceeded to explain to her that we will get her something to eat once she get back on the unit. Once we proceeded to walk to the unit pt hit wall again and was cursing. I asked her again not to do that and lets go on unit. Pt got on unit and was cursing loudly and hitting the glass and proceeded to call nurses out of their names. Barbara Cower Np had came on the unit, and security due to the loudness they had heard. We escorted her off the unit and back into room 135, she proceeded to curse and continue to be loud. I went to get her medication and also explain to her about treating the staff with respect because we are here to help her not hurt or disrespect her. I also explained that due to her behavior and aggressiveness she has woken the other patients up on unit. I conversed with Barbara Cower NP and Berna Spare whom is the counselor that assessed her  and they came to a decision that she was to be discharged due to the aggressive behavior. Security helped walk pt out
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
## 2021-02-19 NOTE — ED Notes (Signed)
Pt has several things of belongings. 4 bags were placed inside locker 30 And a laundry basket was placed in day room [place].

## 2021-02-19 NOTE — Discharge Instructions (Addendum)

## 2021-02-19 NOTE — Discharge Instructions (Addendum)
Transfer to [facility] 

## 2021-02-19 NOTE — ED Notes (Signed)
Pt DC d off unit per provider, pt alert, calm, cooperative, no s/s of distress. DC information given to [employer] for facility stay. Belongings given to pt. Pt ambulatory off unit, escorted by NT. Pt transported by [employer].

## 2021-02-19 NOTE — ED Notes (Signed)
Pt provided water.  
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
## 2021-02-19 NOTE — ED Notes (Signed)
Lockers #5 AND 6  Red door dash bag Purse (Black) Product manager - full of clothing
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
## 2021-03-10 ENCOUNTER — Ambulatory Visit (HOSPITAL_COMMUNITY): Payer: Medicaid Other | Admitting: Psychiatry

## 2021-03-11 ENCOUNTER — Ambulatory Visit (HOSPITAL_COMMUNITY): Payer: Medicaid Other | Admitting: Psychiatry

## 2021-03-22 ENCOUNTER — Other Ambulatory Visit: Payer: Self-pay

## 2021-03-22 ENCOUNTER — Ambulatory Visit: Payer: Medicaid Other | Admitting: Physician Assistant

## 2021-03-22 VITALS — BP 111/77 | HR 85 | Temp 98.2°F | Resp 18 | Ht 59.0 in | Wt 239.0 lb

## 2021-03-22 DIAGNOSIS — R7303 Prediabetes: Secondary | ICD-10-CM | POA: Diagnosis not present

## 2021-03-22 DIAGNOSIS — F314 Bipolar disorder, current episode depressed, severe, without psychotic features: Secondary | ICD-10-CM | POA: Diagnosis not present

## 2021-03-22 DIAGNOSIS — Z8659 Personal history of other mental and behavioral disorders: Secondary | ICD-10-CM | POA: Diagnosis not present

## 2021-03-22 DIAGNOSIS — F1490 Cocaine use, unspecified, uncomplicated: Secondary | ICD-10-CM

## 2021-03-22 DIAGNOSIS — F332 Major depressive disorder, recurrent severe without psychotic features: Secondary | ICD-10-CM | POA: Diagnosis not present

## 2021-03-22 DIAGNOSIS — F1020 Alcohol dependence, uncomplicated: Secondary | ICD-10-CM

## 2021-03-22 MED ORDER — SERTRALINE HCL 50 MG PO TABS: 1.0000 | ORAL_TABLET | Freq: Every day | ORAL | 1 refills | Status: AC

## 2021-03-22 NOTE — Progress Notes (Signed)
New Patient Office Visit  Subjective:  Patient ID: Jordan Walton, female    DOB: [birth year]  Age: 30 y.o. MRN: [id number]  CC:  Chief Complaint  Patient presents with   Depression     HPI Sian Santina Trillo reports that she is currently in substance abuse residential treatment program at [facility].  Reports that she will be transferring to [facility] in [place] to continue her care.  Reports that she has been taking Zoloft 50 mg with relief.  No other concerns at this time  Past Medical History:  Diagnosis Date   Crack cocaine use    Herpes simplex virus (HSV) infection    Pregnancy induced hypertension     Past Surgical History:  Procedure Laterality Date   CESAREAN SECTION N/A 06/18/2020   Procedure: CESAREAN SECTION;  Surgeon: Tereso Newcomer, MD;  Location: [facility] LD ORS;  Service: Obstetrics;  Laterality: N/A;    Family History  Problem Relation Age of Onset   Hypertension Father     Social History   Socioeconomic History   Marital status: Single    Spouse name: Not on file   Number of children: Not on file   Years of education: Not on file   Highest education level: Not on file  Occupational History   Occupation: Unemployed  Tobacco Use   Smoking status: Former    Packs/day: 0.25    Pack years: 0.00    Types: Cigarettes   Smokeless tobacco: Never  Vaping Use   Vaping Use: Never used  Substance and Sexual Activity   Alcohol use: Not Currently    Alcohol/week: 6.0 - 12.0 standard drinks    Types: 6 - 12 Cans of beer per week    Comment: last was 11/04/19   Drug use: Yes    Frequency: 7.0 times per week    Types: "Crack" cocaine, Cocaine   Sexual activity: Yes    Birth control/protection: None  Other Topics Concern   Not on file  Social History Narrative   Client lives in [place]; unemployed; not actively seeking under care of psychiatrist, recently stopped attending [facility].   Social Determinants of Health   Financial Resource  Strain: Not on file  Food Insecurity: Not on file  Transportation Needs: Not on file  Physical Activity: Not on file  Stress: Not on file  Social Connections: Not on file  Intimate Partner Violence: Not on file    ROS Review of Systems  Constitutional: Negative.   HENT: Negative.    Eyes: Negative.   Respiratory:  Negative for shortness of breath.   Cardiovascular:  Negative for chest pain.  Gastrointestinal: Negative.   Endocrine: Negative.   Genitourinary: Negative.   Musculoskeletal: Negative.   Skin: Negative.   Allergic/Immunologic: Negative.   Neurological: Negative.   Hematological: Negative.   Psychiatric/Behavioral:  Negative for dysphoric mood, self-injury, sleep disturbance and suicidal ideas. The patient is not nervous/anxious.    Objective:   Today's Vitals: BP 111/77 (BP Location: Left Arm, Patient Position: Sitting, Cuff Size: Large)   Pulse 85   Temp 98.2 F (36.8 C) (Oral)   Resp 18   Ht 4\' 11"  (1.499 m)   Wt 239 lb (108.4 kg)   LMP 03/07/2021   SpO2 97%   BMI 48.27 kg/m   Physical Exam Vitals and nursing note reviewed.  Constitutional:      Appearance: Normal appearance.  HENT:     Head: Normocephalic and atraumatic.     Right  Ear: External ear normal.     Left Ear: External ear normal.     Nose: Nose normal.     Mouth/Throat:     Mouth: Mucous membranes are moist.     Pharynx: Oropharynx is clear.  Eyes:     Extraocular Movements: Extraocular movements intact.     Conjunctiva/sclera: Conjunctivae normal.     Pupils: Pupils are equal, round, and reactive to light.  Cardiovascular:     Rate and Rhythm: Normal rate and regular rhythm.     Pulses: Normal pulses.     Heart sounds: Normal heart sounds.  Pulmonary:     Effort: Pulmonary effort is normal.     Breath sounds: Normal breath sounds.  Musculoskeletal:        General: Normal range of motion.     Cervical back: Normal range of motion and neck supple.  Skin:    General: Skin is  warm and dry.  Neurological:     General: No focal deficit present.     Mental Status: She is alert and oriented to person, place, and time.  Psychiatric:        Mood and Affect: Mood normal.        Thought Content: Thought content normal.        Judgment: Judgment normal.    Assessment & Plan:   Problem List Items Addressed This Visit       Other   Uncomplicated alcohol dependence (HCC) (Chronic)   Major depressive disorder, recurrent episode (HCC) - Primary   Relevant Medications   sertraline (ZOLOFT) 50 MG tablet   Bipolar I disorder, most recent episode depressed, severe without psychotic features (HCC)   History of suicidal ideation   Crack cocaine use   Prediabetes    Outpatient Encounter Medications as of 03/22/2021  Medication Sig   folic acid (FOLVITE) 1 MG tablet Take 1 mg by mouth daily. (Patient not taking: No sig reported)   ibuprofen (ADVIL) 200 MG tablet Take 800 mg by mouth every 6 (six) hours as needed for mild pain or headache.   ketotifen (ZADITOR) 0.025 % ophthalmic solution Place 1 drop into both eyes 2 (two) times daily.   levocetirizine (XYZAL) 5 MG tablet Take 1 tablet (5 mg total) by mouth every evening.   NIFEdipine (ADALAT CC) 30 MG 24 hr tablet Take 1 tablet (30 mg total) by mouth daily. (Patient not taking: No sig reported)   sertraline (ZOLOFT) 50 MG tablet Take 1 tablet (50 mg total) by mouth daily.   [DISCONTINUED] buPROPion (WELLBUTRIN XL) 150 MG 24 hr tablet Take 1 tablet (150 mg total) by mouth daily.   [DISCONTINUED] Cholecalciferol (VITAMIN D3) 50 MCG (2000 UT) capsule Take 2,000 Units by mouth daily. (Patient not taking: No sig reported)   [DISCONTINUED] fluticasone (FLONASE) 50 MCG/ACT nasal spray Place 1 spray into both nostrils daily. (Patient not taking: Reported on 02/19/2021)   [DISCONTINUED] sertraline (ZOLOFT) 50 MG tablet Take 1 tablet by mouth daily.   No facility-administered encounter medications on file as of 03/22/2021.  1.  Severe episode of recurrent major depressive disorder, without psychotic features (HCC) Continue current regimen.  Patient is transferring her care to [facility] in [place].  Patient encouraged to return to mobile unit as needed. - sertraline (ZOLOFT) 50 MG tablet; Take 1 tablet (50 mg total) by mouth daily.  Dispense: 30 tablet; Refill: 1  2. Bipolar I disorder, most recent episode depressed, severe without psychotic features (HCC) Continue current regimen  3. Prediabetes On review of chart, patient did have A1c of 6.4 on Feb 19, 2021.  Patient education given on low sugar diet, encourage patient to follow-up with medical provider in 2 months for repeat A1c  4. History of suicidal ideation Currently in residential treatment  5. Uncomplicated alcohol dependence (HCC) Currently in residential treatment  6. Crack cocaine use Currently in residential treatment   I have reviewed the patient's medical history (PMH, PSH, Social History, Family History, Medications, and allergies) , and have been updated if relevant. I spent 32 minutes reviewing chart and  face to face time with patient.     Follow-up: Return if symptoms worsen or fail to improve.   Kasandra Knudsen Mayers, PA-C

## 2021-03-22 NOTE — Progress Notes (Signed)
Patient has taken medication and eaten today. Patient request refills on medications- zoloft. Patient denies pain at this time.

## 2021-03-22 NOTE — Patient Instructions (Signed)
For your depression, you will continue taking Zoloft 50 mg once a day.  For your prediabetes, I encourage you to follow a low sugar diet, avoid all sugary drinks, increase your water intake, you should be drinking 64 ounces of water a day, and make sure that you follow-up with the mobile medicine unit or a medical provider in the next couple of months to recheck your blood sugar levels.  Please let us know if there is anything else we can do for you.  Roney Jaffe, PA-C Physician Assistant [facility] Medicine [email or web address]   Diabetes Care, 44(Suppl 1), [phone]. https://doi.org/https://doi.org/10.2337/dc21-S003">  Prediabetes Prediabetes is when your blood sugar (blood glucose) level is higher than normal but not high enough for you to be diagnosed with type 2 diabetes. Having prediabetes puts you at risk for developing type 2 diabetes (type 2 diabetes mellitus). With certain lifestyle changes, you may be able to prevent or delay the onset of type 2 diabetes. This is important because type 2 diabetes can lead to serious complications, such as: Heart disease. Stroke. Blindness. Kidney disease. Depression. Poor circulation in the feet and legs. In severe cases, this could lead to surgical removal of a leg (amputation). What are the causes? The exact cause of prediabetes is not known. It may result from insulin resistance. Insulin resistance develops when cells in the body do not respond properly to insulin that the body makes. This can cause excess glucose to build up in the blood. High blood glucose (hyperglycemia) can develop. What increases the risk? The following factors may make you more likely to develop this condition: You have a family member with type 2 diabetes. You are older than 45 years. You had a temporary form of diabetes during a pregnancy (gestational diabetes). You had polycystic ovary syndrome (PCOS). You are  overweight or obese. You are inactive (sedentary). You have a history of heart disease, including problems with cholesterol levels, high levels of blood fats, or high blood pressure. What are the signs or symptoms? You may have no symptoms. If you do have symptoms, they may include: Increased hunger. Increased thirst. Increased urination. Vision changes, such as blurry vision. Tiredness (fatigue). How is this diagnosed? This condition can be diagnosed with blood tests. Your blood glucose may be checked with one or more of the following tests: A fasting blood glucose (FBG) test. You will not be allowed to eat (you will fast) for at least 8 hours before a blood sample is taken. An A1C blood test (hemoglobin A1C). This test provides information about blood glucose levels over the previous 2?3 months. An oral glucose tolerance test (OGTT). This test measures your blood glucose at two points in time: After fasting. This is your baseline level. Two hours after you drink a beverage that contains glucose. You may be diagnosed with prediabetes if: Your FBG is 100?125 mg/dL (2.3-7.6 mmol/L). Your A1C level is 5.7?6.4% (39-46 mmol/mol). Your OGTT result is 140?199 mg/dL (2.8-31 mmol/L). These blood tests may be repeated to confirm your diagnosis. How is this treated? Treatment may include dietary and lifestyle changes to help lower your blood glucose and prevent type 2 diabetes from developing. In some cases, medicinemay be prescribed to help lower the risk of type 2 diabetes. Follow these instructions at home: Nutrition  Follow a healthy meal plan. This includes eating lean proteins, whole grains, legumes, fresh fruits and vegetables, low-fat dairy products, and healthy fats. Follow instructions from your health care provider about eating or drinking restrictions.  Meet with a dietitian to create a healthy eating plan that is right for you.  Lifestyle Do moderate-intensity exercise for at least  30 minutes a day on 5 or more days each week, or as told by your health care provider. A mix of activities may be best, such as: Brisk walking, swimming, biking, and weight lifting. Lose weight as told by your health care provider. Losing 5-7% of your body weight can reverse insulin resistance. Do not drink alcohol if: Your health care provider tells you not to drink. You are pregnant, may be pregnant, or are planning to become pregnant. If you drink alcohol: Limit how much you use to: 0-1 drink a day for women. 0-2 drinks a day for men. Be aware of how much alcohol is in your drink. In the [place] one drink equals one 12 oz bottle of beer (355 mL), one 5 oz glass of wine (148 mL), or one 1 oz glass of hard liquor (44 mL). General instructions Take over-the-counter and prescription medicines only as told by your health care provider. You may be prescribed medicines that help lower the risk of type 2 diabetes. Do not use any products that contain nicotine or tobacco, such as cigarettes, e-cigarettes, and chewing tobacco. If you need help quitting, ask your health care provider. Keep all follow-up visits. This is important. Where to find more information American Diabetes Association: www.diabetes.org Academy of Nutrition and Dietetics: www.eatright.org American Heart Association: www.heart.org Contact a health care provider if: You have any of these symptoms: Increased hunger. Increased urination. Increased thirst. Fatigue. Vision changes, such as blurry vision. Get help right away if you: Have shortness of breath. Feel confused. Vomit or feel like you may vomit. Summary Prediabetes is when your blood sugar (blood glucose)level is higher than normal but not high enough for you to be diagnosed with type 2 diabetes. Having prediabetes puts you at risk for developing type 2 diabetes (type 2 diabetes mellitus). Make lifestyle changes such as eating a healthy diet and exercising regularly  to help prevent diabetes. Lose weight as told by your health care provider. This information is not intended to replace advice given to you by your health care provider. Make sure you discuss any questions you have with your healthcare provider. Document Revised: 12/19/2019 Document Reviewed: 12/19/2019 Elsevier Patient Education  2022 [employer].

## 2021-03-23 DIAGNOSIS — R7303 Prediabetes: Secondary | ICD-10-CM | POA: Insufficient documentation

## 2021-03-25 NOTE — Addendum Note (Signed)
Addended by: Roney Jaffe on: 03/25/2021 02:17 PM   Modules accepted: Orders
